# Patient Record
Sex: Female | Born: 1945 | Race: White | Hispanic: No | Marital: Married | Smoking: Former smoker
Health system: Southern US, Community
[De-identification: ages and names within clinical notes are randomized; demographics above are authoritative.]

## PROBLEM LIST (undated history)

## (undated) DIAGNOSIS — I10 Essential (primary) hypertension: Secondary | ICD-10-CM

## (undated) DIAGNOSIS — G43909 Migraine, unspecified, not intractable, without status migrainosus: Secondary | ICD-10-CM

## (undated) DIAGNOSIS — I679 Cerebrovascular disease, unspecified: Secondary | ICD-10-CM

## (undated) DIAGNOSIS — M1990 Unspecified osteoarthritis, unspecified site: Secondary | ICD-10-CM

## (undated) DIAGNOSIS — E785 Hyperlipidemia, unspecified: Secondary | ICD-10-CM

## (undated) DIAGNOSIS — I6529 Occlusion and stenosis of unspecified carotid artery: Secondary | ICD-10-CM

## (undated) HISTORY — DX: Occlusion and stenosis of unspecified carotid artery: I65.29

## (undated) HISTORY — DX: Cerebrovascular disease, unspecified: I67.9

## (undated) HISTORY — DX: Unspecified osteoarthritis, unspecified site: M19.90

## (undated) HISTORY — DX: Migraine, unspecified, not intractable, without status migrainosus: G43.909

## (undated) HISTORY — DX: Essential (primary) hypertension: I10

## (undated) HISTORY — DX: Hyperlipidemia, unspecified: E78.5

## (undated) HISTORY — PX: OTHER SURGICAL HISTORY: SHX169

---

## 1985-09-10 HISTORY — PX: BREAST BIOPSY: SHX20

## 1998-03-03 ENCOUNTER — Other Ambulatory Visit: Admission: RE | Admit: 1998-03-03 | Discharge: 1998-03-03 | Payer: Self-pay | Admitting: Obstetrics & Gynecology

## 1999-03-06 ENCOUNTER — Other Ambulatory Visit: Admission: RE | Admit: 1999-03-06 | Discharge: 1999-03-06 | Payer: Self-pay | Admitting: Obstetrics & Gynecology

## 2000-03-26 ENCOUNTER — Other Ambulatory Visit: Admission: RE | Admit: 2000-03-26 | Discharge: 2000-03-26 | Payer: Self-pay | Admitting: Obstetrics & Gynecology

## 2000-12-24 ENCOUNTER — Encounter (INDEPENDENT_AMBULATORY_CARE_PROVIDER_SITE_OTHER): Payer: Self-pay | Admitting: Specialist

## 2000-12-24 ENCOUNTER — Other Ambulatory Visit: Admission: RE | Admit: 2000-12-24 | Discharge: 2000-12-24 | Payer: Self-pay | Admitting: Obstetrics & Gynecology

## 2001-03-27 ENCOUNTER — Other Ambulatory Visit: Admission: RE | Admit: 2001-03-27 | Discharge: 2001-03-27 | Payer: Self-pay | Admitting: Obstetrics & Gynecology

## 2002-03-30 ENCOUNTER — Other Ambulatory Visit: Admission: RE | Admit: 2002-03-30 | Discharge: 2002-03-30 | Payer: Self-pay | Admitting: Obstetrics & Gynecology

## 2003-04-01 ENCOUNTER — Other Ambulatory Visit: Admission: RE | Admit: 2003-04-01 | Discharge: 2003-04-01 | Payer: Self-pay | Admitting: Obstetrics & Gynecology

## 2004-04-04 ENCOUNTER — Other Ambulatory Visit: Admission: RE | Admit: 2004-04-04 | Discharge: 2004-04-04 | Payer: Self-pay | Admitting: Obstetrics & Gynecology

## 2005-04-06 ENCOUNTER — Other Ambulatory Visit: Admission: RE | Admit: 2005-04-06 | Discharge: 2005-04-06 | Payer: Self-pay | Admitting: Obstetrics & Gynecology

## 2006-12-25 ENCOUNTER — Ambulatory Visit: Payer: Self-pay | Admitting: Vascular Surgery

## 2007-10-20 ENCOUNTER — Ambulatory Visit: Payer: Self-pay | Admitting: Cardiology

## 2007-12-01 ENCOUNTER — Ambulatory Visit: Payer: Self-pay | Admitting: Cardiology

## 2007-12-01 LAB — CONVERTED CEMR LAB
AST: 24 units/L (ref 0–37)
Albumin: 4.1 g/dL (ref 3.5–5.2)
Bilirubin, Direct: 0.1 mg/dL (ref 0.0–0.3)
Cholesterol: 186 mg/dL (ref 0–200)
Direct LDL: 109.2 mg/dL
Glucose, Bld: 101 mg/dL — ABNORMAL HIGH (ref 70–99)
HDL: 38.2 mg/dL — ABNORMAL LOW (ref >39.0)
Total Bilirubin: 0.6 mg/dL (ref 0.3–1.2)
Triglycerides: 212 mg/dL (ref 0–149)
VLDL: 42 mg/dL — ABNORMAL HIGH (ref 0–40)

## 2007-12-24 ENCOUNTER — Ambulatory Visit: Payer: Self-pay | Admitting: Cardiology

## 2008-01-06 ENCOUNTER — Ambulatory Visit: Payer: Self-pay | Admitting: Vascular Surgery

## 2008-01-13 ENCOUNTER — Ambulatory Visit: Payer: Self-pay | Admitting: Cardiology

## 2008-01-13 LAB — CONVERTED CEMR LAB
ALT: 20 units/L (ref 0–35)
AST: 24 units/L (ref 0–37)
Alkaline Phosphatase: 75 units/L (ref 39–117)
HDL: 32.8 mg/dL — ABNORMAL LOW (ref >39.0)
Total Bilirubin: 0.8 mg/dL (ref 0.3–1.2)
Total CHOL/HDL Ratio: 5.3
Triglycerides: 197 mg/dL — ABNORMAL HIGH (ref 0–149)

## 2008-01-29 ENCOUNTER — Ambulatory Visit: Payer: Self-pay | Admitting: Internal Medicine

## 2008-04-15 ENCOUNTER — Ambulatory Visit: Payer: Self-pay | Admitting: Cardiology

## 2008-04-15 LAB — CONVERTED CEMR LAB
AST: 21 units/L (ref 0–37)
Albumin: 4.1 g/dL (ref 3.5–5.2)
Alkaline Phosphatase: 79 units/L (ref 39–117)
Bilirubin, Direct: 0.1 mg/dL (ref 0.0–0.3)
Cholesterol: 251 mg/dL (ref 0–200)
Total CHOL/HDL Ratio: 6.2
VLDL: 44 mg/dL — ABNORMAL HIGH (ref 0–40)

## 2008-04-22 ENCOUNTER — Ambulatory Visit: Payer: Self-pay | Admitting: Cardiology

## 2008-05-27 ENCOUNTER — Ambulatory Visit: Payer: Self-pay | Admitting: Cardiology

## 2008-05-27 LAB — CONVERTED CEMR LAB
ALT: 21 U/L (ref 0–35)
AST: 25 U/L (ref 0–37)
Albumin: 4.1 g/dL (ref 3.5–5.2)
Alkaline Phosphatase: 87 U/L (ref 39–117)
Bilirubin, Direct: 0.1 mg/dL (ref 0.0–0.3)
Cholesterol: 251 mg/dL (ref 0–200)
Direct LDL: 171.6 mg/dL
HDL: 37.3 mg/dL — ABNORMAL LOW (ref >39.0)
Total Bilirubin: 0.7 mg/dL (ref 0.3–1.2)
Total CHOL/HDL Ratio: 6.7
Total Protein: 6.7 g/dL (ref 6.0–8.3)
Triglycerides: 198 mg/dL — ABNORMAL HIGH (ref 0–149)
VLDL: 40 mg/dL (ref 0–40)

## 2008-06-03 ENCOUNTER — Ambulatory Visit: Payer: Self-pay | Admitting: Cardiology

## 2008-08-11 ENCOUNTER — Ambulatory Visit: Payer: Self-pay | Admitting: Cardiology

## 2008-08-11 LAB — CONVERTED CEMR LAB
Albumin: 3.9 g/dL (ref 3.5–5.2)
Alkaline Phosphatase: 70 units/L (ref 39–117)
Cholesterol: 245 mg/dL (ref 0–200)
Direct LDL: 160.1 mg/dL
HDL: 38.7 mg/dL — ABNORMAL LOW (ref >39.0)
Total CHOL/HDL Ratio: 6.3
Total Protein: 6.6 g/dL (ref 6.0–8.3)
Triglycerides: 246 mg/dL (ref 0–149)
VLDL: 49 mg/dL — ABNORMAL HIGH (ref 0–40)

## 2008-08-12 ENCOUNTER — Ambulatory Visit: Payer: Self-pay | Admitting: Cardiology

## 2008-09-10 HISTORY — PX: ABDOMINAL HYSTERECTOMY: SHX81

## 2008-09-24 ENCOUNTER — Ambulatory Visit: Payer: Self-pay | Admitting: Cardiology

## 2008-11-01 ENCOUNTER — Ambulatory Visit: Payer: Self-pay

## 2008-11-01 LAB — CONVERTED CEMR LAB
ALT: 17 units/L (ref 0–35)
AST: 19 units/L (ref 0–37)
Direct LDL: 158.8 mg/dL
HDL: 34.8 mg/dL — ABNORMAL LOW (ref >39.0)
Total Bilirubin: 0.9 mg/dL (ref 0.3–1.2)
Total Protein: 6.5 g/dL (ref 6.0–8.3)
Triglycerides: 359 mg/dL (ref 0–149)

## 2008-11-04 ENCOUNTER — Ambulatory Visit: Payer: Self-pay | Admitting: Cardiology

## 2008-11-04 ENCOUNTER — Ambulatory Visit: Payer: Self-pay | Admitting: Internal Medicine

## 2008-11-04 LAB — CONVERTED CEMR LAB
CO2: 31 meq/L (ref 19–32)
Calcium: 9.6 mg/dL (ref 8.4–10.5)
Creatinine, Ser: 0.9 mg/dL (ref 0.4–1.2)
GFR calc Af Amer: 82 mL/min
Glucose, Bld: 91 mg/dL (ref 70–99)

## 2008-11-12 ENCOUNTER — Ambulatory Visit: Payer: Self-pay | Admitting: Cardiology

## 2008-11-12 LAB — CONVERTED CEMR LAB
BUN: 16 mg/dL (ref 6–23)
Calcium: 9.9 mg/dL (ref 8.4–10.5)
GFR calc Af Amer: 82 mL/min
GFR calc non Af Amer: 68 mL/min
Glucose, Bld: 99 mg/dL (ref 70–99)
Sodium: 140 meq/L (ref 135–145)

## 2008-11-26 ENCOUNTER — Ambulatory Visit: Payer: Self-pay | Admitting: Cardiology

## 2008-11-26 LAB — CONVERTED CEMR LAB
BUN: 19 mg/dL (ref 6–23)
Creatinine, Ser: 0.9 mg/dL (ref 0.4–1.2)
GFR calc non Af Amer: 67.45 mL/min (ref >60)

## 2008-12-24 ENCOUNTER — Ambulatory Visit: Payer: Self-pay | Admitting: Cardiology

## 2008-12-24 LAB — CONVERTED CEMR LAB
ALT: 19 units/L (ref 0–35)
AST: 20 units/L (ref 0–37)
Albumin: 4 g/dL (ref 3.5–5.2)
Alkaline Phosphatase: 83 units/L (ref 39–117)
Cholesterol: 214 mg/dL — ABNORMAL HIGH (ref 0–200)
Total Protein: 6.5 g/dL (ref 6.0–8.3)

## 2008-12-28 ENCOUNTER — Ambulatory Visit: Payer: Self-pay | Admitting: Vascular Surgery

## 2008-12-30 ENCOUNTER — Encounter: Payer: Self-pay | Admitting: Cardiology

## 2008-12-30 ENCOUNTER — Ambulatory Visit: Payer: Self-pay | Admitting: Internal Medicine

## 2008-12-30 DIAGNOSIS — E785 Hyperlipidemia, unspecified: Secondary | ICD-10-CM | POA: Insufficient documentation

## 2009-01-18 ENCOUNTER — Inpatient Hospital Stay (HOSPITAL_COMMUNITY): Admission: RE | Admit: 2009-01-18 | Discharge: 2009-01-19 | Payer: Self-pay | Admitting: Obstetrics and Gynecology

## 2009-01-18 ENCOUNTER — Encounter (INDEPENDENT_AMBULATORY_CARE_PROVIDER_SITE_OTHER): Payer: Self-pay | Admitting: Obstetrics and Gynecology

## 2009-03-28 ENCOUNTER — Ambulatory Visit: Payer: Self-pay | Admitting: Cardiovascular Disease

## 2009-03-28 LAB — CONVERTED CEMR LAB
AST: 24 units/L (ref 0–37)
Albumin: 4 g/dL (ref 3.5–5.2)
Cholesterol: 195 mg/dL (ref 0–200)
Direct LDL: 116.2 mg/dL
HDL: 35.6 mg/dL — ABNORMAL LOW (ref >39.00)
Total CHOL/HDL Ratio: 5
Triglycerides: 297 mg/dL — ABNORMAL HIGH (ref 0.0–149.0)

## 2009-04-04 ENCOUNTER — Ambulatory Visit: Payer: Self-pay | Admitting: Cardiology

## 2009-04-26 ENCOUNTER — Encounter (INDEPENDENT_AMBULATORY_CARE_PROVIDER_SITE_OTHER): Payer: Self-pay | Admitting: *Deleted

## 2009-07-14 ENCOUNTER — Encounter: Payer: Self-pay | Admitting: Cardiology

## 2009-07-18 ENCOUNTER — Telehealth: Payer: Self-pay | Admitting: Cardiology

## 2009-08-02 ENCOUNTER — Ambulatory Visit: Payer: Self-pay | Admitting: Cardiology

## 2009-08-02 LAB — CONVERTED CEMR LAB
ALT: 23 units/L (ref 0–35)
Alkaline Phosphatase: 90 units/L (ref 39–117)
Bilirubin, Direct: 0.1 mg/dL (ref 0.0–0.3)
Cholesterol: 201 mg/dL — ABNORMAL HIGH (ref 0–200)
HDL: 39.5 mg/dL (ref >39.00)
Total Protein: 6.9 g/dL (ref 6.0–8.3)

## 2009-08-11 ENCOUNTER — Ambulatory Visit: Payer: Self-pay | Admitting: Cardiovascular Disease

## 2009-11-03 DIAGNOSIS — I10 Essential (primary) hypertension: Secondary | ICD-10-CM | POA: Insufficient documentation

## 2009-11-03 DIAGNOSIS — I679 Cerebrovascular disease, unspecified: Secondary | ICD-10-CM

## 2009-11-03 DIAGNOSIS — I779 Disorder of arteries and arterioles, unspecified: Secondary | ICD-10-CM | POA: Insufficient documentation

## 2009-11-03 DIAGNOSIS — E876 Hypokalemia: Secondary | ICD-10-CM | POA: Insufficient documentation

## 2009-11-08 ENCOUNTER — Ambulatory Visit: Payer: Self-pay | Admitting: Cardiology

## 2009-11-17 ENCOUNTER — Ambulatory Visit: Payer: Self-pay | Admitting: Cardiology

## 2009-11-18 ENCOUNTER — Telehealth: Payer: Self-pay | Admitting: Cardiology

## 2009-11-18 LAB — CONVERTED CEMR LAB
Calcium: 9.7 mg/dL (ref 8.4–10.5)
Creatinine, Ser: 0.8 mg/dL (ref 0.4–1.2)

## 2009-11-30 ENCOUNTER — Telehealth: Payer: Self-pay | Admitting: Cardiology

## 2009-12-07 ENCOUNTER — Encounter: Payer: Self-pay | Admitting: Cardiology

## 2009-12-26 ENCOUNTER — Telehealth (INDEPENDENT_AMBULATORY_CARE_PROVIDER_SITE_OTHER): Payer: Self-pay | Admitting: *Deleted

## 2009-12-26 ENCOUNTER — Encounter: Payer: Self-pay | Admitting: Cardiology

## 2010-01-31 ENCOUNTER — Encounter: Payer: Self-pay | Admitting: Cardiology

## 2010-01-31 ENCOUNTER — Ambulatory Visit: Payer: Self-pay | Admitting: Vascular Surgery

## 2010-02-20 ENCOUNTER — Ambulatory Visit: Payer: Self-pay | Admitting: Cardiology

## 2010-02-20 LAB — CONVERTED CEMR LAB
ALT: 17 units/L (ref 0–35)
AST: 22 units/L (ref 0–37)
Albumin: 4.3 g/dL (ref 3.5–5.2)
Cholesterol: 252 mg/dL — ABNORMAL HIGH (ref 0–200)
Direct LDL: 177.8 mg/dL
Total Protein: 6.5 g/dL (ref 6.0–8.3)
Triglycerides: 223 mg/dL — ABNORMAL HIGH (ref 0.0–149.0)

## 2010-02-23 ENCOUNTER — Ambulatory Visit: Payer: Self-pay | Admitting: Cardiology

## 2010-04-21 ENCOUNTER — Telehealth: Payer: Self-pay | Admitting: Cardiology

## 2010-04-24 ENCOUNTER — Ambulatory Visit: Payer: Self-pay | Admitting: Cardiology

## 2010-04-25 LAB — CONVERTED CEMR LAB
ALT: 19 units/L (ref 0–35)
Alkaline Phosphatase: 76 units/L (ref 39–117)
BUN: 27 mg/dL — ABNORMAL HIGH (ref 6–23)
Bilirubin, Direct: 0.2 mg/dL (ref 0.0–0.3)
Chloride: 100 meq/L (ref 96–112)
Creatinine, Ser: 1.4 mg/dL — ABNORMAL HIGH (ref 0.4–1.2)
Direct LDL: 130.8 mg/dL
Total Protein: 6.5 g/dL (ref 6.0–8.3)

## 2010-04-27 ENCOUNTER — Ambulatory Visit: Payer: Self-pay | Admitting: Internal Medicine

## 2010-10-08 LAB — CONVERTED CEMR LAB
BUN: 17 mg/dL (ref 6–23)
Calcium: 9.7 mg/dL (ref 8.4–10.5)
GFR calc non Af Amer: 77.03 mL/min (ref >60)
Glucose, Bld: 87 mg/dL (ref 70–99)
Sodium: 140 meq/L (ref 135–145)

## 2010-10-12 NOTE — Letter (Signed)
Summary: Physician RX Concern  Physician RX Concern   Imported By: Roderic Ovens 01/10/2010 11:57:58  _____________________________________________________________________  External Attachment:    Type:   Image     Comment:   External Document

## 2010-10-12 NOTE — Progress Notes (Signed)
Summary: QUESTION ABOUT METOPROLOL  Phone Note Call from Patient Call back at Home Phone [phone]   Caller: Patient Summary of Call: PT HAVE QUESTION ABOUT HER MEDICATION ( METOPROLOL) Initial call taken by: Judie Grieve,  November 18, 2009 4:29 PM  Follow-up for Phone Call        spoke with pt, at the last office visit she had mentioned to dr Jens Som some problems she has had with the toprol. she was going to wait to change but has now decided she would like to change from toprol to something else. will foward to dr Jens Som for review Deliah Goody, RN  November 18, 2009 4:38 PM   Additional Follow-up for Phone Call Additional follow up Details #1::        change toprol to 25 q day for 2 days and then dc. Add norvasc 5 mg by mouth daily. Ferman Hamming, MD, [facility]  November 20, 2009 3:43 PM  spoke with pt, she has taken amlodipine and had to stop due to swelling in feet and legs. will let dr Jens Som know Deliah Goody, RN  November 21, 2009 5:05 PM      Additional Follow-up for Phone Call Additional follow up Details #2::    dc toprol as described previously and then begin cardizem CD 120 mg by mouth daily. Ferman Hamming, MD, [facility]  November 22, 2009 4:13 PM  pt aware of instructions with toprol and new med Deliah Goody, RN  November 23, 2009 10:41 AM   New/Updated Medications: CARDIZEM CD 120 MG XR24H-CAP (DILTIAZEM HCL COATED BEADS) one tablet by mouth once daily Prescriptions: CARDIZEM CD 120 MG XR24H-CAP (DILTIAZEM HCL COATED BEADS) one tablet by mouth once daily  #30 x 12   Entered by:   Deliah Goody, RN   Authorized by:   Ferman Hamming, MD, [facility]   Signed by:   Deliah Goody, RN on 11/23/2009   Method used:   Electronically to        [facility]  [phone]* (retail)       [address] [place], [place]  [address]       Ph: [phone] or [phone]       Fax: [phone]   RxID:   [id number]

## 2010-10-12 NOTE — Progress Notes (Signed)
Summary: Calling baut having labs drawn  Phone Note Call from Patient Call back at Work Phone [phone]   Caller: Patient Summary of Call: Pt calling regarding having spme labs drawn Initial call taken by: Judie Grieve,  April 21, 2010 10:14 AM  Follow-up for Phone Call        spoke with pt, she needs blood work done for her work. will add a bmp to labs already ordered Deliah Goody, RN  April 21, 2010 12:19 PM

## 2010-10-12 NOTE — Progress Notes (Signed)
Summary: med question  Phone Note Call from Patient Call back at Home Phone [phone]   Caller: Patient Reason for Call: Talk to Nurse Summary of Call: dilitiazem is making her jumpy, is this normal? request call Initial call taken by: Migdalia Dk,  November 30, 2009 4:30 PM  Follow-up for Phone Call        spoke with pt, she has noticed that she is "wired" all the time since starting the diltiazem. she feels jumpy inside and is unable to sit still. she has had three doses of the diltiazem. denies other problems. will let dr Jens Som know Deliah Goody, RN  November 30, 2009 5:21 PM   Additional Follow-up for Phone Call Additional follow up Details #1::        not typical side effect of diltiazem Ferman Hamming, MD, [facility]  December 01, 2009 6:07 PM  spoke with pt, she had faxed a letter to [place] regarding her symptoms. she will stop the  cardizem and restart the toprol at the previous dosage. she will report any problems Deliah Goody, RN  December 07, 2009 3:19 PM     New/Updated Medications: METOPROLOL SUCCINATE 50 MG XR24H-TAB (METOPROLOL SUCCINATE) Take one tablet by mouth daily

## 2010-10-12 NOTE — Progress Notes (Signed)
  Phone Note Outgoing Call   Call placed by: Deliah Goody, RN,  December 26, 2009 3:45 PM Summary of Call: we had received a fax from pt requesting to decrease her metoprolol to 25mg  to help with side effects. dr Jens Som reviewed and okay given for pt to take toprol 25mg  once daily. Initial call taken by: Deliah Goody, RN,  December 26, 2009 3:46 PM    New/Updated Medications: METOPROLOL SUCCINATE 25 MG XR24H-TAB (METOPROLOL SUCCINATE) Take one tablet by mouth daily

## 2010-10-12 NOTE — Assessment & Plan Note (Signed)
Summary: yearly sl   CC:  yearly  visit.  History of Present Illness: Christine Daniels returns for followup today.  She is a very pleasant  female with hypertension and  hyperlipidemia, as well as cerebrovascular disease.  I last saw her in February of 2010. Since then the patient denies any dyspnea on exertion, orthopnea, PND, pedal edema, palpitations, syncope or chest pain.   Hypertension History:      Positive major cardiovascular risk factors include female age 46 years old or older, hyperlipidemia, and hypertension.  Negative major cardiovascular risk factors include no history of diabetes.        Further assessment for target organ damage reveals no history of ASHD or peripheral vascular disease.    Current Medications (verified): 1)  Toprol Xl 50 Mg Xr24h-Tab (Metoprolol Succinate) .Marland Kitchen.. 1 Tablet By Mouth Once A Day 2)  Aspirin Adult Low Strength 81 Mg Tbec (Aspirin) .... Take 1 Tablet By Mouth Daily 3)  Multivitamins  Tabs (Multiple Vitamin) .... Take 1 Tablet Daily 4)  Co Q-10 50 Mg Caps (Coenzyme Q10) .... Take 2 Capsults Twice Daily 5)  Vitamin C 500 Mg  Tabs (Ascorbic Acid) .... 3 Tabs By Mouth Once Daily 6)  Folic Acid 1 Mg Tabs (Folic Acid) .... Take 1 Tablet Daily 7)  Triamterene-Hctz 75-50 Mg Tabs (Triamterene-Hctz) .... Take 1 Tablet Daily 8)  Lipitor 10 Mg Tabs (Atorvastatin Calcium) .Marland Kitchen.. 1 Tab Every Other Day 9)  Vitamin D 1000 Unit Tabs (Cholecalciferol) .Marland Kitchen.. 1 Tab By Mouth Once Daily 10)  Multivitamins   Tabs (Multiple Vitamin) .Marland Kitchen.. 1 Tab By Mouth Once Daily 11)  Zonegran 100 Mg Caps (Zonisamide) .Marland Kitchen.. 1 Tab By Mouth Once Daily 12)  Claritin 10 Mg Tabs (Loratadine) .... As Needed 13)  Zomig 2.5 Mg Tabs (Zolmitriptan) .... As Needed 14)  Skelaxin 800 Mg Tabs (Metaxalone) .... As Needed  Allergies: No Known Drug Allergies  Past History:  Past Medical History: HYPERLIPIDEMIA (ICD-272.4) CEREBROVASCULAR DISEASE (ICD-437.9) followed by Dr. Edilia Bo HYPERTENSION  (ICD-401.9)  Incomplete uterovaginal prolapse  Arthritis in her back.  history of migraines  Past Surgical History: Reviewed history from 11/03/2009 and no changes required.  Status post bilateral tubal ligation.    Laparoscopically-assisted vaginal hysterectomy with bilateral salpingo-oophorectomy, McCall culdoplasty, anterior and posterior colporrhaphy.      Social History: Reviewed history from 11/03/2009 and no changes required.  She has remote history of minimal tobacco use, but has   not smoked in years.  She rarely consumes alcohol.      Review of Systems       Complains of some rash under her chin, ringing in her ears, back pain but no fevers or chills, productive cough, hemoptysis, dysphasia, odynophagia, melena, hematochezia, dysuria, hematuria, rash, seizure activity, orthopnea, PND, pedal edema, claudication. Remaining systems are negative.   Vital Signs:  Patient profile:   65 year old female Height:      70 inches Weight:      169 pounds BMI:     24.34 Pulse rate:   74 / minute Resp:     12 per minute BP sitting:   130 / 72  (left arm)  Vitals Entered By: Kem Parkinson (November 08, 2009 8:32 AM)  Physical Exam  General:  Well-developed well-nourished in no acute distress.  Skin is warm and dry.  HEENT is normal.  Neck is supple. No thyromegaly.  Chest is clear to auscultation with normal expansion.  Cardiovascular exam is regular rate and rhythm.  Abdominal exam nontender or distended. No masses palpated. Extremities show no edema. neuro grossly intact    EKG  Procedure date:  11/08/2009  Findings:      normal sinus rhythm at a rate of 74. Axis normal. No ST changes.  Impression & Recommendations:  Problem # 1:  HYPERLIPIDEMIA (ICD-272.4) Continue present dose of statin. Higher doses caused significant back pain and myalgias. Followup in lipid clinic. Her updated medication list for this problem includes:    Lipitor 10 Mg Tabs (Atorvastatin  calcium) .Marland Kitchen... 1 tab every other day  Problem # 2:  CEREBROVASCULAR DISEASE (ICD-437.9) This is being managed by Dr. Edilia Bo. Continue aspirin and statin.  Problem # 3:  HYPERTENSION (ICD-401.9) Blood pressure controlled on present medications. Will continue. Check bmet. Her updated medication list for this problem includes:    Toprol Xl 50 Mg Xr24h-tab (Metoprolol succinate) .Marland Kitchen... 1 tablet by mouth once a day    Aspirin Adult Low Strength 81 Mg Tbec (Aspirin) .Marland Kitchen... Take 1 tablet by mouth daily    Triamterene-hctz 75-50 Mg Tabs (Triamterene-hctz) .Marland Kitchen... Take 1 tablet daily  Orders: TLB-BMP (Basic Metabolic Panel-BMET) (80048-METABOL) EKG w/ Interpretation (93000)  Hypertension Assessment/Plan:      The patient's hypertensive risk group is category B: At least one risk factor (excluding diabetes) with no target organ damage.  Today's blood pressure is 130/72.     Patient Instructions: 1)  Your physician recommends that you schedule a follow-up appointment in: ONE YEAR

## 2010-10-12 NOTE — Assessment & Plan Note (Signed)
Summary: rov/cb   CC:  dyslipidemia follow-up.  History of Present Illness:  Lipid Clinic Visit      The patient comes in today for dyslipidemia follow-up.  The patient presents with medication problems and back pain which has resolved since decreasing Crestor to 5mg  QOD.   She has no complaints of chest pain, palpitations, shortness of breath, muscle aches, and muscle cramps.  Dietary compliance review reveals an overall grade of not eating 5 or more fruits and vegetables and not counting carbohydrates.  Review of exercise habits reveals that the patient is walking and daily.  Adjunctive measures being instituted include omega-3 supplements.  Compliance with medication is good.    Christine Daniels is seen in lipid clinic for evaluation and management of hyperlipidemia.  She has been compliant with her therapy.  She has been exercising and consuming low-fat, low-cholesterol diet.    All satatins have caused pain.  Crestor 5mg  every other day tolerable  Lipid Management Provider  Charolotte Eke, PharmD  Preventive Screening-Counseling & Management  Alcohol-Tobacco     Alcohol drinks/day: <1     Smoking Status: quit  Current Medications (verified): 1)  Metoprolol Succinate 25 Mg Xr24h-Tab (Metoprolol Succinate) .... Take One Tablet By Mouth Daily 2)  Aspirin Adult Low Strength 81 Mg Tbec (Aspirin) .... Take 1 Tablet By Mouth Daily 3)  Multivitamins  Tabs (Multiple Vitamin) .... Take 1 Tablet Daily 4)  Co Q-10 50 Mg Caps (Coenzyme Q10) .... Take 2 Capsults Twice Daily 5)  Vitamin C 500 Mg  Tabs (Ascorbic Acid) .... 3 Tabs By Mouth Once Daily 6)  Folic Acid 1 Mg Tabs (Folic Acid) .... Take 1 Tablet Daily 7)  Triamterene-Hctz 75-50 Mg Tabs (Triamterene-Hctz) .... Take 1 Tablet Daily 8)  Vitamin D 1000 Unit Tabs (Cholecalciferol) .Marland Kitchen.. 1 Tab By Mouth Once Daily 9)  Multivitamins   Tabs (Multiple Vitamin) .Marland Kitchen.. 1 Tab By Mouth Once Daily 10)  Zonegran 100 Mg Caps (Zonisamide) .Marland Kitchen.. 1 Tab By Mouth Once  Daily 11)  Claritin 10 Mg Tabs (Loratadine) .... As Needed 12)  Zomig 2.5 Mg Tabs (Zolmitriptan) .... As Needed 13)  Skelaxin 800 Mg Tabs (Metaxalone) .... As Needed 14)  Klor-Con M20 20 Meq Cr-Tabs (Potassium Chloride Crys Cr) .... One Tablet By Mouth Once Daily 15)  Crestor 10 Mg Tabs (Rosuvastatin Calcium) .... Take 1 Tablet By Mouth Three Times Weekly 16)  Fish Oil 1000 Mg Caps (Omega-3 Fatty Acids) .... 3 Capsules By Mouth Daily  Allergies (verified): No Known Drug Allergies  Social History: Alcohol drinks/day:  <1 Smoking Status:  quit   Vital Signs:  Patient profile:   65 year old female Height:      70 inches Weight:      166 pounds Pulse rate:   80 / minute Pulse rhythm:   regular BP sitting:   140 / 98  (right arm) Cuff size:   regular  Impression & Recommendations:  Problem # 1:  HYPERLIPIDEMIA (ICD-272.4)  Her updated medication list for this problem includes:    Crestor 10 Mg Tabs (Rosuvastatin calcium) .Marland Kitchen... Take 1 tablet by mouth three times weekly   Christine Daniels complained of back pain after increased Crestor 10mg  every other day so went back to Crestor 5mg  every other day and back pains are resolved.  She  walks around neighborhood several times a day with new puppies and  golfs approx 1  x week.  She also hasd an active lifestyle.  She eats a failrly  healthy diet, cereal for  breakfast, snack bars and rice cakes with peanut butter thruought day and ahealthy dinner.  We discussed ways to increase fiber in diet and increase fruits and veges.   Her lipid panel is not at goal but improved.   TC 201 at goal < 200  TG 185 > goal < 150  LDL 130 > goal < 100 but much improved  HDL 43 at goal > 40. BP elevated today in office but bp usually < 130/90 at home  Plan - to keep crestor 5mg  every other day increase exercise walk at least 1mi at a time 1x day  increase fiber and fruit and vege in diet check 54mo

## 2010-10-12 NOTE — Assessment & Plan Note (Signed)
Summary: rov-tp   Visit Type:  Follow-up   History of Present Illness: Christine Daniels is a 65 yo female presenting for FU to Lipid Clinic.  Patient has significant back pain, which is being treated with injections (likely steroids, but unsure).  She is also experiencing increased stress at work.  She has been off her cholesterol meds for 1 week.  Statins (Zocor, Crestor, and most others) worsen her back pain, but she tolerates Lipitor 10 mg every other day because she knows how beneficial they are.  She also takes CoQ10 150 mg two times a day and Fish Oil 2 gm daily.  She is compliant.  Patient states she has lost about 5 lbs in the last few months, however she admits liking junk food.  Weaknesses include french fries, pizza, and chips.  She does not eat sweets.  Her breakfast meal is fairly healthy - cereal w/ 1/2% milk or muffins w/ coffee.  For lunch, she usually eats a light PB or other sandwich on wheat or rye.  For dinner, she eats a lot of take-out.  She eats fried or baked meats (50/50) with vegetables.  She does not eat large portions.  Patient admits she does not watch her diet like she should.  Patient's exercise is limited by back pain, however she does golf approx. 1x weekly, does yard work, and walks 2-3 nights per week.  She has been more active since last seen.    Christine Daniels is seen in lipid clinic for evaluation and management of hyperlipidemia.  She has been compliant with her therapy.  She has been exercising and consuming low-fat, low-cholesterol diet.    Lipid Management Provider  Charolotte Eke, PharmD  Preventive Screening-Counseling & Management  Alcohol-Tobacco     Alcohol drinks/day: 0     Smoking Status: never  Current Medications (verified): 1)  Metoprolol Succinate 25 Mg Xr24h-Tab (Metoprolol Succinate) .... Take One Tablet By Mouth Daily 2)  Aspirin Adult Low Strength 81 Mg Tbec (Aspirin) .... Take 1 Tablet By Mouth Daily 3)  Multivitamins  Tabs (Multiple Vitamin)  .... Take 1 Tablet Daily 4)  Co Q-10 50 Mg Caps (Coenzyme Q10) .... Take 2 Capsults Twice Daily 5)  Vitamin C 500 Mg  Tabs (Ascorbic Acid) .... 3 Tabs By Mouth Once Daily 6)  Folic Acid 1 Mg Tabs (Folic Acid) .... Take 1 Tablet Daily 7)  Triamterene-Hctz 75-50 Mg Tabs (Triamterene-Hctz) .... Take 1 Tablet Daily 8)  Vitamin D 1000 Unit Tabs (Cholecalciferol) .Marland Kitchen.. 1 Tab By Mouth Once Daily 9)  Multivitamins   Tabs (Multiple Vitamin) .Marland Kitchen.. 1 Tab By Mouth Once Daily 10)  Zonegran 100 Mg Caps (Zonisamide) .Marland Kitchen.. 1 Tab By Mouth Once Daily 11)  Claritin 10 Mg Tabs (Loratadine) .... As Needed 12)  Zomig 2.5 Mg Tabs (Zolmitriptan) .... As Needed 13)  Skelaxin 800 Mg Tabs (Metaxalone) .... As Needed 14)  Klor-Con M20 20 Meq Cr-Tabs (Potassium Chloride Crys Cr) .... One Tablet By Mouth Once Daily 15)  Crestor 10 Mg Tabs (Rosuvastatin Calcium) .... Take 1 Tablet By Mouth Three Times Weekly 16)  Fish Oil 1000 Mg Caps (Omega-3 Fatty Acids) .... 3 Capsules By Mouth Daily  Allergies (verified): No Known Drug Allergies  Social History: Alcohol drinks/day:  0 Smoking Status:  never   Impression & Recommendations:  Problem # 1:  HYPERLIPIDEMIA (ICD-272.4) Assessment Deteriorated NCEP/ATP III goals are TC<200, TG<150, LDL<100, HDL>40. Current lipid panel is TC 252, TG 223, LDL 177, HDL 46.7. LFTs WNL.  Dyslipidemia has worsened since last visit, except HDL is now above goal. Due to increased back pain, decided to DC Lipitor, hold statins x1 wk, and try Crestor 10 mg 3x weekly, which has not previously been tried. If this fails, other options include Zetia, Tricor, or Niaspan. Increase Fish Oil to 3 caps daily. Increase fruit/vegetable intake. Decrease fried foods to 1x wk. Exercise as tolerated. FU on 04/27/10 with Lipid Clinic. 8/15 for labs. The following medications were removed from the medication list:    Lipitor 10 Mg Tabs (Atorvastatin calcium) .Marland Kitchen... 1 tab every other day Her updated medication list for  this problem includes:    Crestor 10 Mg Tabs (Rosuvastatin calcium) .Marland Kitchen... Take 1 tablet by mouth three times weekly    Fish Oil 1000 Mg Caps .Marland Kitchen... 2 capsules by mouth daily    Coenzyme Q10 150 mg .Marland Kitchen... Takes one by mouth two times a day   Patient Instructions: 1)  1. Stop Lipitor. 2)  2. Do not take any Crestor until 6/23 or 6/24, then start Crestor 10 mg 3 times per week. May try twice weekly if muscle pain is worse, or may 1/2 the 10 mg tablet. 3)  3. Increase Fish Oil to 3 capsules daily. 4)  4. Continue Coenzyme Q10. 5)  5. Increase fruits and vegetables to at least 3 servings per day. Limit fried food to once weekly. 6)  6. Exercise as tolerated. 7)  7. Call Lipid Clinic if muscle pains are intolerable before next visit.  8)  8. Follow-up with Lipid Clinic in 8 weeks on 04/27/10 at 8:30. 9)  9. Fasting labs on 04/24/10 at [facility].

## 2010-10-12 NOTE — Letter (Signed)
Summary: Physician RX Concern  Physician RX Concern   Imported By: Roderic Ovens 01/09/2010 16:26:17  _____________________________________________________________________  External Attachment:    Type:   Image     Comment:   External Document

## 2010-10-23 ENCOUNTER — Other Ambulatory Visit: Payer: Self-pay

## 2010-10-23 ENCOUNTER — Encounter (INDEPENDENT_AMBULATORY_CARE_PROVIDER_SITE_OTHER): Payer: Self-pay | Admitting: *Deleted

## 2010-10-23 ENCOUNTER — Other Ambulatory Visit: Payer: Self-pay | Admitting: Cardiology

## 2010-10-23 DIAGNOSIS — E7800 Pure hypercholesterolemia, unspecified: Secondary | ICD-10-CM

## 2010-10-23 DIAGNOSIS — Z79899 Other long term (current) drug therapy: Secondary | ICD-10-CM

## 2010-10-23 DIAGNOSIS — E785 Hyperlipidemia, unspecified: Secondary | ICD-10-CM

## 2010-10-23 LAB — COMPREHENSIVE METABOLIC PANEL
Albumin: 4.2 g/dL (ref 3.5–5.2)
BUN: 22 mg/dL (ref 6–23)
CO2: 30 mEq/L (ref 19–32)
Calcium: 9.1 mg/dL (ref 8.4–10.5)
GFR: 81.48 mL/min (ref >60.00)
Glucose, Bld: 82 mg/dL (ref 70–99)
Potassium: 3.8 mEq/L (ref 3.5–5.1)
Sodium: 135 mEq/L (ref 135–145)
Total Protein: 6.4 g/dL (ref 6.0–8.3)

## 2010-10-23 LAB — LDL CHOLESTEROL, DIRECT: Direct LDL: 132.3 mg/dL

## 2010-10-23 LAB — LIPID PANEL: HDL: 59.7 mg/dL (ref >39.00)

## 2010-10-26 ENCOUNTER — Encounter: Payer: Self-pay | Admitting: Cardiology

## 2010-10-26 ENCOUNTER — Ambulatory Visit (INDEPENDENT_AMBULATORY_CARE_PROVIDER_SITE_OTHER): Payer: Commercial Managed Care - PPO

## 2010-10-26 DIAGNOSIS — E7800 Pure hypercholesterolemia, unspecified: Secondary | ICD-10-CM

## 2010-10-26 DIAGNOSIS — Z79899 Other long term (current) drug therapy: Secondary | ICD-10-CM

## 2010-11-01 NOTE — Assessment & Plan Note (Signed)
Summary: ROV LIPIDLMC/TT   History of Present Illness: The patient comes in today for dyslipidemia follow-up.  The patient has experienced muscle pains with statins but currently has no complaints of muscle aches or cramps while taking Crestor 5 mg every other day.   The patient is fairly compliant with her diet. For breakfast she eats cereal or a muffin with low-fat milk. For lunch she eats a sandwich/salad. For dinner she eats some form of meat with vegetables. She snacks on fruit or granola bars. She claims her weakness is "potato chips". She dose not typically eat desserts.   The patient has been walking 20-30 minutes 2-3 times/day since she has gotten a dog. She also golfs whenever the weather is nice.  The patient does not smoke but does drink alcohol on ocassion.    Current Medications (verified): 1)  Metoprolol Succinate 25 Mg Xr24h-Tab (Metoprolol Succinate) .... Take One Tablet By Mouth Daily 2)  Aspirin Adult Low Strength 81 Mg Tbec (Aspirin) .... Take 1 Tablet By Mouth Daily 3)  Multivitamins  Tabs (Multiple Vitamin) .... Take 1 Tablet Daily 4)  Co Q-10 50 Mg Caps (Coenzyme Q10) .... Take 2 Capsults Twice Daily 5)  Vitamin C 500 Mg  Tabs (Ascorbic Acid) .... 3 Tabs By Mouth Once Daily 6)  Folic Acid 1 Mg Tabs (Folic Acid) .... Take 1 Tablet Daily 7)  Triamterene-Hctz 75-50 Mg Tabs (Triamterene-Hctz) .... Take 1 Tablet Daily 8)  Vitamin D 1000 Unit Tabs (Cholecalciferol) .Marland Kitchen.. 1 Tab By Mouth Once Daily 9)  Multivitamins   Tabs (Multiple Vitamin) .Marland Kitchen.. 1 Tab By Mouth Once Daily 10)  Zonegran 100 Mg Caps (Zonisamide) .... 2 Tab By Mouth Once Daily 11)  Claritin 10 Mg Tabs (Loratadine) .... As Needed 12)  Zomig 2.5 Mg Tabs (Zolmitriptan) .... As Needed 13)  Skelaxin 800 Mg Tabs (Metaxalone) .... As Needed 14)  Klor-Con M20 20 Meq Cr-Tabs (Potassium Chloride Crys Cr) .... One Tablet By Mouth Once Daily 15)  Crestor 10 Mg Tabs (Rosuvastatin Calcium) .... Take 1 Tablet By Mouth Three  Times Weekly 16)  Fish Oil 1000 Mg Caps (Omega-3 Fatty Acids) .... 3 Capsules By Mouth Daily  Allergies (verified): No Known Drug Allergies  Vital Signs:  Patient profile:   65 year old female Height:      70 inches Weight:      162 pounds Pulse rate:   62 / minute BP sitting:   134 / 90  (right arm)   Impression & Recommendations:  Problem # 1:  HYPERLIPIDEMIA (ICD-272.4) Per ATEP III guidelines, Mrs. Dardis is currently only at goal for HDL = 59.7. She is NOT at goal for TC=209 (goal <200), TG=200 (goal <150), and LDL= 132.3 (goal <100). She has been compliant with taking Crestor 5 mg every other day but has not taken her fish oil as prescribed. She is doing well with her diet/exercise as shown by improvements in her HDL.  Mrs. Langsam has agreed to try alternating between Crestor 5 mg and Crestor 10 mg every other day. She will still only be taking the medicine 3-4x/week but this will serve to increase her dose slightly while still attempting to manager her muscle pain. If she cannot tolerate the occassional 10 mg dose--she can drop back down to 5 mg. She does complain of slight muscle pain the day after she takes her Crestor doses. She also has agreed to start taking her fish oil again. She wasn't taking them because the "tablets were too  big and she just didn't like taking them." After counseling on the importance of the medication she will try to resume her dose. She will take 3 grams/day.  In regards to diet and exercise, she will keep up her current habits and we will work on medication adjustments at this point. She was told to let us know if her new regimen is not tolerable.  Her updated medication list for this problem includes:    Crestor 5-10 Mg Tabs (Rosuvastatin calcium) .Marland Kitchen... Take 1 tablet by mouth 3-4 times weekly    Fish Oil  1 g Tabs (Omega-3 fatty acids)...........Marland KitchenTake 3 tablets by mouth daily.  Patient Instructions: 1)  Change Crestor to take 5 mg or 10 mg  (alternating) every other day 2)  Resume Fish oil 3 grams daily 3)  Continue eating low-cholesterol diet 4)  Continue walking 20-30 minutes 2-3x/day 5)  Will f/u with lipid panel and appt in 6 months

## 2010-11-09 ENCOUNTER — Ambulatory Visit (INDEPENDENT_AMBULATORY_CARE_PROVIDER_SITE_OTHER): Payer: Commercial Managed Care - PPO | Admitting: Cardiology

## 2010-11-09 ENCOUNTER — Encounter: Payer: Self-pay | Admitting: Cardiology

## 2010-11-09 DIAGNOSIS — I10 Essential (primary) hypertension: Secondary | ICD-10-CM

## 2010-11-09 DIAGNOSIS — E7800 Pure hypercholesterolemia, unspecified: Secondary | ICD-10-CM

## 2010-11-16 NOTE — Assessment & Plan Note (Signed)
Summary: [email or web address]   CC:  check up.  History of Present Illness: Christine Daniels returns for followup today.  She is a very pleasant  female with hypertension and  hyperlipidemia, as well as cerebrovascular disease. Carotid Dopplers in May of 2011 showed 40-59% bilateral stenosis. I last saw her in March of 2011. Since then the patient denies any dyspnea on exertion, orthopnea, PND, pedal edema, palpitations, syncope or chest pain.  Current Medications (verified): 1)  Metoprolol Succinate 25 Mg Xr24h-Tab (Metoprolol Succinate) .... Take One Tablet By Mouth Daily 2)  Aspirin Adult Low Strength 81 Mg Tbec (Aspirin) .... Take 1 Tablet By Mouth Daily 3)  Multivitamins  Tabs (Multiple Vitamin) .... Take 1 Tablet Daily 4)  Co Q-10 50 Mg Caps (Coenzyme Q10) .... Take 2 Capsults Twice Daily 5)  Triamterene-Hctz 75-50 Mg Tabs (Triamterene-Hctz) .... Take 1 Tablet Daily 6)  Vitamin D 1000 Unit Tabs (Cholecalciferol) .Marland Kitchen.. 1 Tab By Mouth Once Daily 7)  Multivitamins   Tabs (Multiple Vitamin) .Marland Kitchen.. 1 Tab By Mouth Once Daily 8)  Zonegran 100 Mg Caps (Zonisamide) .... 2 Tab By Mouth Once Daily 9)  Claritin 10 Mg Tabs (Loratadine) .... As Needed 10)  Zomig 2.5 Mg Tabs (Zolmitriptan) .... As Needed 11)  Klor-Con M20 20 Meq Cr-Tabs (Potassium Chloride Crys Cr) .... One Tablet By Mouth Once Daily 12)  Crestor 10 Mg Tabs (Rosuvastatin Calcium) .... Take 1 Tablet By Mouth Three Times Weekly 13)  Fish Oil 1000 Mg Caps (Omega-3 Fatty Acids) .... 3 Capsules By Mouth Daily  Allergies: No Known Drug Allergies  Past History:  Past Medical History: Reviewed history from 11/08/2009 and no changes required. HYPERLIPIDEMIA (ICD-272.4) CEREBROVASCULAR DISEASE (ICD-437.9) followed by Dr. Edilia Bo HYPERTENSION (ICD-401.9)  Incomplete uterovaginal prolapse  Arthritis in her back.  history of migraines  Past Surgical History: Status post bilateral tubal ligation.   Laparoscopically-assisted vaginal hysterectomy  with bilateral salpingo-oophorectomy, McCall culdoplasty, anterior and posterior colporrhaphy.      Social History: Reviewed history from 11/03/2009 and no changes required.  She has remote history of minimal tobacco use, but has   not smoked in years.  She rarely consumes alcohol.      Review of Systems       no fevers or chills, productive cough, hemoptysis, dysphasia, odynophagia, melena, hematochezia, dysuria, hematuria, rash, seizure activity, orthopnea, PND, pedal edema, claudication. Remaining systems are negative.   Vital Signs:  Patient profile:   65 year old female Height:      70 inches Weight:      163 pounds BMI:     23.47 Pulse rate:   71 / minute Resp:     14 per minute BP sitting:   134 / 78  (left arm)  Vitals Entered By: Kem Parkinson (November 09, 2010 10:38 AM)  Physical Exam  General:  Well-developed well-nourished in no acute distress.  Skin is warm and dry.  HEENT is normal.  Neck is supple. No thyromegaly.  Chest is clear to auscultation with normal expansion.  Cardiovascular exam is regular rate and rhythm.  Abdominal exam nontender or distended. No masses palpated. Extremities show no edema. neuro grossly intact    EKG  Procedure date:  11/09/2010  Findings:      Sinus rhythm with no ST changes.  Impression & Recommendations:  Problem # 1:  HYPERLIPIDEMIA (ICD-272.4) Continue present medications. Followup in the lipid clinic. Her updated medication list for this problem includes:    Crestor 10 Mg Tabs (Rosuvastatin calcium) .Marland KitchenMarland KitchenMarland KitchenMarland Kitchen  Take 1 tablet by mouth three times weekly  Problem # 2:  CEREBROVASCULAR DISEASE (ICD-437.9) Continue aspirin and statin. Followup carotid Dopplers per vascular surgery.  Problem # 3:  HYPERTENSION (ICD-401.9) Blood pressure controlled on present medications. Will continue. Her updated medication list for this problem includes:    Metoprolol Succinate 25 Mg Xr24h-tab (Metoprolol succinate) .Marland Kitchen... Take one  tablet by mouth daily    Aspirin Adult Low Strength 81 Mg Tbec (Aspirin) .Marland Kitchen... Take 1 tablet by mouth daily    Triamterene-hctz 75-50 Mg Tabs (Triamterene-hctz) .Marland Kitchen... Take 1 tablet daily

## 2010-12-19 LAB — URINALYSIS, ROUTINE W REFLEX MICROSCOPIC
Nitrite: NEGATIVE
Protein, ur: NEGATIVE mg/dL
Specific Gravity, Urine: 1.01 (ref 1.005–1.030)
Urobilinogen, UA: 0.2 mg/dL (ref 0.0–1.0)

## 2010-12-19 LAB — BASIC METABOLIC PANEL
BUN: 7 mg/dL (ref 6–23)
Chloride: 97 mEq/L (ref 96–112)
Creatinine, Ser: 0.57 mg/dL (ref 0.4–1.2)
GFR calc non Af Amer: >60 mL/min (ref >60)
Glucose, Bld: 125 mg/dL — ABNORMAL HIGH (ref 70–99)

## 2010-12-19 LAB — COMPREHENSIVE METABOLIC PANEL
ALT: 21 U/L (ref 0–35)
AST: 26 U/L (ref 0–37)
Albumin: 4.2 g/dL (ref 3.5–5.2)
Alkaline Phosphatase: 85 U/L (ref 39–117)
BUN: 15 mg/dL (ref 6–23)
Chloride: 97 mEq/L (ref 96–112)
GFR calc Af Amer: >60 mL/min (ref >60)
Potassium: 3 mEq/L — ABNORMAL LOW (ref 3.5–5.1)
Sodium: 134 mEq/L — ABNORMAL LOW (ref 135–145)
Total Protein: 6.5 g/dL (ref 6.0–8.3)

## 2010-12-19 LAB — CBC
HCT: 34.4 % — ABNORMAL LOW (ref 36.0–46.0)
MCV: 93.7 fL (ref 78.0–100.0)
Platelets: 163 10*3/uL (ref 150–400)
Platelets: 225 10*3/uL (ref 150–400)
RDW: 12.6 % (ref 11.5–15.5)
RDW: 12.8 % (ref 11.5–15.5)
WBC: 14.9 10*3/uL — ABNORMAL HIGH (ref 4.0–10.5)
WBC: 6 10*3/uL (ref 4.0–10.5)

## 2010-12-19 LAB — URINE MICROSCOPIC-ADD ON

## 2011-01-10 ENCOUNTER — Other Ambulatory Visit: Payer: Commercial Managed Care - PPO

## 2011-01-22 ENCOUNTER — Other Ambulatory Visit (INDEPENDENT_AMBULATORY_CARE_PROVIDER_SITE_OTHER): Payer: Commercial Managed Care - PPO

## 2011-01-22 DIAGNOSIS — I6529 Occlusion and stenosis of unspecified carotid artery: Secondary | ICD-10-CM

## 2011-01-23 NOTE — Procedures (Signed)
CAROTID DUPLEX EXAM   INDICATION:  Followup, carotid artery disease.   HISTORY:  Diabetes:  No.  Cardiac:  No.  Hypertension:  Yes.  Smoking:  No.  Previous Surgery:  No.  CV History:  No.  Amaurosis Fugax No, Paresthesias No, Hemiparesis No                                       RIGHT             LEFT  Brachial systolic pressure:         120               110  Brachial Doppler waveforms:         Triphasic         Triphasic  Vertebral direction of flow:        Antegrade         Antegrade  DUPLEX VELOCITIES (cm/sec)  CCA peak systolic                   65                75  ECA peak systolic                   55                61  ICA peak systolic                   148               127  ICA end diastolic                   56                39  PLAQUE MORPHOLOGY:                  Homogenous        Homogenous  PLAQUE AMOUNT:                      Moderate          Moderate  PLAQUE LOCATION:                    Mid-distal ICA/CCA                  Mid-distal ICA/CCA   IMPRESSION:  1. Bilateral mid-distal internal carotid artery stenosis of 40-59%.  2. No significant changes from previous study.   ___________________________________________  Di Kindle. Edilia Bo, M.D.   AS/MEDQ  D:  01/06/2008  T:  01/06/2008  Job:  [id number]

## 2011-01-23 NOTE — Assessment & Plan Note (Signed)
[facility]                               LIPID CLINIC NOTE   Christine, Daniels                      MRN:          [id number]  DATE:06/03/2008                            DOB:          07/30/1946    Christine Daniels comes in today for followup of her hyperlipidemia therapy  which includes fish oil 1200 mg daily which she has recently started.  She elected not to start Questran as discussed at her August visit due  to her prescription plan and pharmacy changing as well as issues of  migraine headaches she was having.   Other medications include aspirin 81 mg, multivitamin, vitamin C, folic  acid, vitamin E, Maxzide, vitamin D, and Norvasc.   PHYSICAL EXAMINATION:  Weight is 169 pounds, blood pressure is 135/90,  heart rate is 80.   LABORATORY DATA:  Total cholesterol 251, triglycerides 198, HDL 37.3,  LDL 171.6.  Liver function tests are within normal limits.   ASSESSMENT:  Christine Daniels is doing fine, started fish oil on her own, but  has delayed starting Questran.  Her lipid panel is very similar to last  time.  Her triglycerides are slightly improved, but not at goal.  Her  HDL and LDL remain not at goal.  She has been continuing with her heart-  healthy diet, eating red meat and reports very rarely.  She continues to  walk daily when the weather is good and plays golf once a week.   PLAN:  I asked her to increase her fish oil to 2400 to 3600 mg per day.  We also are going to start Questran this time starting at a low dose and  increasing to ideal dose of 8 g twice daily.  We talked about potential  drug interactions and the need to separate its administration from her  other medications.  We also talked briefly about the possible GI side  effects, and I asked her to call us with any problems or questions until  we see her next time and we congratulated her on her exercise and diet  and asked her to continue with that.  We are going to follow up with  her  in about 10 weeks with a repeat liver and lipid panel and make any  changes at that time there as necessary.      Charolotte Eke, PharmD  Electronically Signed      Rollene Rotunda, MD, [facility]  Electronically Signed   TP/MedQ  DD: 06/03/2008  DT: 06/04/2008  Job #: [id number]

## 2011-01-23 NOTE — Assessment & Plan Note (Signed)
[facility]                            CARDIOLOGY OFFICE NOTE   JAYLENN, BAIZA                      MRN:          [id number]  DATE:11/04/2008                            DOB:          [birth year]    Christine Daniels returns for followup today.  She is a very pleasant 65-year-  old female with hypertension and hyperlipidemia, as well as  cerebrovascular disease.  When I last saw her, she had developed pedal  edema on Norvasc and we discontinued that and placed her on Toprol 50 mg  p.o. daily.  Since that time, she has felt well.  Her pedal edema has  resolved.  There has been no chest pain, shortness of breath or  palpitations, and there is no fatigue.  Also, of note, that her blood  pressure apparently is well controlled with a systolic of 130 or less  and her diastolic in the 85 or less range.   MEDICATIONS:  1. Calcium and vitamin D.  2. Aspirin 81 mg p.o. daily.  3. Multivitamin.  4. Vitamin C.  5. Folate.  6. Vitamin E.  7. Triamterene and hydrochlorothiazide 75/50 one p.o. daily.  8. Omega-3 fish oil.  9. Toprol 50 mg p.o. daily.   PHYSICAL EXAMINATION:  VITAL SIGNS:  Blood pressure of 130/80 and her  pulse is 60.  She weighs 169 pounds.  HEENT:  Normal.  NECK:  Supple.  CHEST:  Clear.  CARDIOVASCULAR:  Regular rate.  ABDOMEN:  No tenderness.  EXTREMITIES:  No edema.   DIAGNOSES:  1. Hypertension - her blood pressure is adequately controlled on her      present regimen.  We will check a BMET given her Maxzide use.      Note, her pedal edema has resolved on Norvasc.  She will continue      to track her blood pressure at home and continue with lifestyle      modification.  2. Hyperlipidemia - she will continue on her fish oil.  She is being      managed now at the Lipid Clinic.  3. History of migraines.  4. Cerebrovascular disease - this is being followed by Dr. Edilia Bo.      She will continue on her aspirin.     Christine Frieze Jens Som,  MD, [facility]  Electronically Signed    BSC/MedQ  DD: 11/04/2008  DT: 11/04/2008  Job #: [id number]   cc:   Gerrit Friends. Aldona Bar, M.D.

## 2011-01-23 NOTE — H&P (Signed)
Christine Daniels, Christine Daniels               ACCOUNT NO.:  [id number]   MEDICAL RECORD NO.:  [id number]          PATIENT TYPE:  AMB   LOCATION:  [facility]                           FACILITY:  [facility]   PHYSICIAN:  Randye Lobo, M.D.   DATE OF BIRTH:  Jul 12, 1946   DATE OF ADMISSION:  DATE OF DISCHARGE:                              HISTORY & PHYSICAL   CHIEF COMPLAINT:  Vaginal protrusion.   HISTORY OF PRESENT ILLNESS:  The patient is a 65 year old gravida 2,  para 2 Caucasian female, status post bilateral tubal ligation, who  presented to her primary gynecologist, Dr. Annamaria Helling, reporting  vaginal prolapse.  The patient in the office was noted to have pelvic  organ prolapse.  She also reported some difficulty with voiding.  Due to  the extent of her prolapse, she did undergo multichannel urodynamic  testing in the office with reduction of the prolapse, and this  documented no evidence of genuine stress incontinence, and it did  demonstrate normal voiding function.   As the patient wishes for definitive surgical treatment of the  uterovaginal prolapse and she requests that her ovaries are removed.  She does deny a history of genuine stress incontinence.  A plan is now  made to proceed with surgical treatment of the above.   PAST OBSTETRIC AND GYNECOLOGIC HISTORIES:  The patient has had 2 prior  vaginal deliveries.  She is status post bilateral tubal ligation.  She  is not taking any hormone therapy.   PAST MEDICAL HISTORY:  1. Arthritis in her back.  2. Hyperlipidemia.  3. Hypertension.  4. History of migraine headaches.   PAST SURGICAL HISTORY:  Status post bilateral tubal ligation.   MEDICATIONS:  Metoprolol, Lipitor, hydrochlorothiazide/Maxzide, Zomig,  aspirin 81 mg p.o. daily, zonisamide, Fiorinal.   ALLERGIES:  No known drug allergies.   REVIEW OF SYSTEMS:  The patient reports some constipation.   PHYSICAL EXAMINATION:  Height 5 feet 10 inches, weight 170 pounds.  Blood pressure  120/68.  HEENT: Normocephalic, atraumatic.  LUNGS:  Clear to auscultation bilaterally.  HEART:  S1, S2, with a regular rate and rhythm.  ABDOMEN:  Soft and nontender without evidence of hepatosplenomegaly or  organomegaly.  PELVIC EXAM:  Normal external genitalia and urethra.  There are no  lesions of the cervix or the vagina.  There is a third-degree cystocele,  first-degree uterine prolapse, and a first-degree rectocele.  The uterus  is small and nontender, and no adnexal masses are appreciated.  EXTREMITIES:  No edema.   IMPRESSION:  The patient is a 65 year old para 2 Caucasian female with  incomplete uterovaginal prolapse.   PLAN:  The patient will undergo a laparoscopically-assisted vaginal  hysterectomy with bilateral salpingo-oophorectomy, anterior and  posterior colporrhaphy.  Risks, benefits, and alternatives have been  reviewed with the patient, who wishes to proceed.      Randye Lobo, M.D.  Electronically Signed     BES/MEDQ  D:  01/18/2009  T:  01/18/2009  Job:  [id number]

## 2011-01-23 NOTE — Assessment & Plan Note (Signed)
Christine Daniels                            CARDIOLOGY OFFICE NOTE   Christine Daniels, Christine Daniels                      MRN:          [id number]  DATE:09/24/2008                            DOB:          1946/07/24    Christine Daniels is a pleasant 65 year old female, who I have seen in the past  for hypertension as well as hyperlipidemia.  She also has  cerebrovascular disease that is followed by Dr. Edilia Bo in [facility].  Note:  She has been followed in our Lipid Clinic, but has not tolerated any of  the statins.  She is now on fish oil.  She has also been on Maxzide for  her blood pressure and Norvasc 10 mg p.o. daily.  However, now she  developed pedal edema and requested to be seen again.  Note:  She denies  any dyspnea, chest pain, palpitations, or syncope.   Her medications include:  1. Aspirin 81 mg p.o. daily.  2. Multivitamin.  3. Vitamin C.  4. Folate.  5. Vitamin E.  6. Triamterene and hydrochlorothiazide 75/50 mg tablets 1 p.o. daily.  7. Fish oil.  8. Norvasc 10 mg daily.   PHYSICAL EXAMINATION:  VITAL SIGNS:  Blood pressure of 138/87 and her  pulse is 83.  HEENT:  Normal.  NECK:  Supple.  CHEST:  Clear.  CARDIOVASCULAR:  Regular rate and rhythm.  ABDOMEN:  No tenderness.  EXTREMITIES:  No edema.   Electrocardiogram shows sinus rhythm at a rate of 83.  There are no ST  changes noted.  There is poor R-wave progression.   DIAGNOSES:  1. Hypertension - the patient has developed pedal edema on the      Norvasc.  We will discontinue this.  She will continue on her      Maxzide and I will also add Toprol 50 mg p.o. daily.  We can      increase this further as needed.  We could also add an ACE      inhibitor down the road as well.  She will track her blood pressure      at home, and I will see her back in 6-8 weeks, and we will adjust      as indicated.  2. Hyperlipidemia - she will continue on her present medications and      she is being followed in the  Lipid Clinic.  3. History of migraines.  4. Cerebrovascular disease - this is being managed by Dr. Edilia Bo.      She will continue on her aspirin.     Madolyn Frieze Jens Som, MD, [facility]  Electronically Signed    BSC/MedQ  DD: 09/24/2008  DT: 09/25/2008  Job #: [id number]   cc:   Gerrit Friends. Aldona Bar, M.D.

## 2011-01-23 NOTE — Assessment & Plan Note (Signed)
[facility]                               LIPID CLINIC NOTE   RILIE, GLANZ                      MRN:          [id number]  DATE:08/12/2008                            DOB:          02-24-1946    Ms. Saia comes in today for followup of her hyperlipidemia therapy,  which includes fish oil 1200 mg daily.  She was unable to tolerate  Questran.  It caused unbearable bloating and gas.   Her other medications include aspirin 81 mg, multivitamin, vitamin C,  folic acid, vitamin E, Maxzide 75/50, vitamin D, Norvasc as well as  p.r.n. medications Zomig, Zonegran, Flexeril, Fiorinal, and Celebrex.   PHYSICAL EXAMINATION:  VITAL SIGNS:  Weight is 169 and blood pressure is  150/105.  Later on in the visit, it was retaken, and it was 140/100.  Her heart rate was 80.  She has been compliant with her blood pressure  medicines.   LABORATORY DATA:  Total cholesterol 245, triglycerides 246, HDL 38.7,  and LDL 160.1.  Liver function tests were within normal limits.   ASSESSMENT:  Ms. Stahl continues to have high triglycerides and high  LDL.  Her HDL is not too bad, but not at a goal greater than 45.  She is  getting frustrated that we cannot really note down a medication  combination that is going to work for her that she can tolerate.  She  continues to golf and lives a pretty active lifestyle.  Diet is mostly  heart-healthy diet, which she has maintained for quite a while.  Blood  pressure is a little bit high today.  I have told her I would pass that  on to Dr. Jens Som for followup.   PLAN:  We decided to increase her fish oil to 2 tablets twice a day and  then up to 3 or 4 tablets twice a day.  I told her to refrigerate the  capsules as well as take them with food to hopefully avoid side effects.  We are also going to start WelChol, it is similar to Questran, but it is  easier to take being that it is in tablet form.  I am not sure that she  will be  able to tolerate it any better than Questran, but it is worth a  try.  We will start with 2 tablets daily, increasing to 4 tablets daily.  I told her that this needs to be separated from her other medications by  4 hours and that she should take it with plenty of water.  I also told  her when she gets to 4 per day, she could take them all at once or take  2 tablets twice a day.  Prescription was called in for [facility].  I asked  her to continue with her lifestyle habits.  We talked again about how  fortunately her dyslipidemia is likely based on her genetics.  We will  have followup with her in 10 weeks to  assess if these new medication doses are going to work, and she was  asked to call  us with any concerns or questions in the meantime.      Charolotte Eke, PharmD  Electronically Signed      Madolyn Frieze. Jens Som, MD, [facility]  Electronically Signed   TP/MedQ  DD: 08/12/2008  DT: 08/13/2008  Job #: [id number]

## 2011-01-23 NOTE — Procedures (Signed)
CAROTID DUPLEX EXAM   INDICATION:  Follow up carotid artery disease.   HISTORY:  Diabetes:  No.  Cardiac:  No.  Hypertension:  Yes.  Smoking:  No.  Previous Surgery:  No.  CV History:  No.  Amaurosis Fugax No, Paresthesias No, Hemiparesis No.                                       RIGHT             LEFT  Brachial systolic pressure:         115               122  Brachial Doppler waveforms:         WNL               WNL  Vertebral direction of flow:        Antegrade         Antegrade  DUPLEX VELOCITIES (cm/sec)  CCA peak systolic                   78                83  ECA peak systolic                   70                77  ICA peak systolic                   P = 48, M = 141   P = 50, M = 112  ICA end diastolic                   P = 18, M = 53    P = 21, M = 52  PLAQUE MORPHOLOGY:                  Homogenous        Homogenous  PLAQUE AMOUNT:                      Mild/Moderate     Mild/Moderate  PLAQUE LOCATION:                    ICA/CCA           ICA/CCA   IMPRESSION:  1. Bilateral mid/distal internal carotid arteries show evidence of 40-      59% stenosis.  2. No significant changes from previous study.       ___________________________________________  Di Kindle. Edilia Bo, M.D.   AS/MEDQ  D:  12/28/2008  T:  12/28/2008  Job:  [id number]   cc:   Madolyn Frieze. Jens Som, MD, [facility]

## 2011-01-23 NOTE — Assessment & Plan Note (Signed)
[facility]                               LIPID CLINIC NOTE   Christine Daniels, Christine Daniels                      MRN:          [id number]  DATE:04/22/2008                            DOB:          October 06, 1945    Christine Daniels comes in today for followup of her hyperlipidemia therapy,  which has been most recently Zetia 10 mg daily, although she took that  for just 29 days and had the recurrence of leg pain very similar to what  she experienced on multiple statins in the past.  After discontinuing  the Zetia, her leg pain lessened gradually and has now completely  resolved.   CURRENT MEDICATIONS:  1. Aspirin 81 mg.  2. Multivitamin.  3. Vitamin C.  4. Folic acid.  5. Vitamin E.  6. Maxzide 75/50.  7. Vitamin D.  P.r.n. medications:  1. Zomig.  2. Flexeril.  3. Fiorinal.  4. Celebrex.   PHYSICAL EXAMINATION:  VITAL SIGNS:  Weight of 170 pounds.  Blood  pressures is 150/90, heart rate is 82.  She reports that her blood  pressure has been running high lately.   LABORATORY DATA:  Total cholesterol 251, triglycerides 222, HDL 40.2 and  LDL 173.  Liver function tests are within normal limits.   ASSESSMENT:  Christine Daniels most recent lipid panel is indicative of her  being on no pharmacological therapy.  Her LDL is quite elevated, the  goal being less than 70.  HDL is not terribly bad but is not at goal of  greater than 45 and her triglycerides are elevated above the goal of  less than 150.  She has been continuing with heart healthy diet.  She  appears to be feeling good.  She has been golfing once a week at least  and is quite happy that her leg pain has resolved.   PLAN:  I talked to Christine Daniels about different options since she has  failed statins and Zetia.  We talked about fibrates, but she is  reluctant or she is hesitant to try those given the chance that they too  can cause myopathies.  We also talked about bile acid sequestrants being  option as they  are absorbent to the blood stream and have no reports of  muscle aches and pains.  We talked about the difficulties or other  complications with bile acid sequestrants being on multiple tablets per  day, potential GI side effects of bloating, constipation and diarrhea.  She is willing to try a drug from this class, so we are going to start  Questran 4 g twice daily and asked her to do that for at least 2 weeks  and then increase to 8 g b.i.d.  I encouraged her with diet and  exercise.  An electronic prescription was sent to the [facility] in  [place].  If this medication is  not tolerated well, we think we will consider a low-dose fibrate.  She  was instructed to call us with any problems or concerns in the meantime.      Charolotte Eke, PharmD  Electronically Signed      Madolyn Frieze. Jens Som, MD, [facility]  Electronically Signed   TP/MedQ  DD: 04/22/2008  DT: 04/23/2008  Job #: [id number]

## 2011-01-23 NOTE — Procedures (Signed)
CAROTID DUPLEX EXAM   INDICATION:  Follow up carotid disease.   HISTORY:  Diabetes:  No.  Cardiac:  No.  Hypertension:  Yes.  Smoking:  No.  Previous Surgery:  No.  CV History:  Asymptomatic.  Amaurosis Fugax No, Paresthesias No, Hemiparesis No.                                       RIGHT             LEFT  Brachial systolic pressure:         120               128  Brachial Doppler waveforms:         Triphasic         Triphasic  Vertebral direction of flow:        Antegrade         Antegrade  DUPLEX VELOCITIES (cm/sec)  CCA peak systolic                   73                74  ECA peak systolic                   61                62  ICA peak systolic                   141               112  ICA end diastolic                   57                52  PLAQUE MORPHOLOGY:                  Homogenous        Homogeneous  PLAQUE AMOUNT:                      Mild-to-moderate  Mild-to-moderate  PLAQUE LOCATION:                    ICA, ECA          ICA, ECA   IMPRESSION:  1. Bilateral mid-to-distal internal carotid artery velocities show      evidence of 40% to 59% stenosis.  No significant changes from      previous study.  2. Bilateral antegrade flow in vertebral arteries.   ___________________________________________  Di Kindle. Edilia Bo, M.D.   NT/MEDQ  D:  01/31/2010  T:  01/31/2010  Job:  [id number]   cc:   Madolyn Frieze. Jens Som, MD, [facility]

## 2011-01-23 NOTE — Assessment & Plan Note (Signed)
[facility]                            CARDIOLOGY OFFICE NOTE   Christine, Daniels                      MRN:          [id number]  DATE:10/20/2007                            DOB:          03/15/1946    Christine Daniels is a very pleasant 65 year old female who I am asked to  evaluate for hyperlipidemia.  She has no prior cardiac history, although  she does state that she has mild carotid disease that is followed by Dr.  Edilia Daniels of [facility].  Typically, she does not have dyspnea on exertion,  orthopnea, PND, pedal edema, palpitations, pre-syncope, syncope or  exertional chest pain.  There is no claudication.  She does have a  history of hyperlipidemia.  I do have the recent laboratories from Dr.  Aldona Daniels drawn on October 03, 2007.  At that time, her total cholesterol was  243 with an HDL of 42 and an LDL of 160.  She has been tried on Crestor  as well as Zocor, but both of these cause myalgias.  We are now asked  further evaluate.   CURRENT MEDICATIONS:  1. Maxzide 75/50 daily.  2. Aspirin 81 mg daily.  3. Multivitamin daily.  4. Vitamin C.  5. Folate.  6. Vitamin E.  7. She also takes Zomig p.r.n.  8. Flexeril p.r.n.  9. Fiorinal p.r.n.  10.Celebrex 200 mg p.o. daily.   She has NO KNOWN DRUG ALLERGIES.   SOCIAL HISTORY:  She has remote history of minimal tobacco use, but has  not smoked in years.  She rarely consumes alcohol.   FAMILY HISTORY:  Is negative for coronary disease.   PAST MEDICAL HISTORY:  Significant for hypertension and hyperlipidemia.  There is no diabetes mellitus.  She has had a previous tubal ligation.  She also has a cervical disk problem and has had previous tonsillectomy.  She has had benign breast biopsy.  There is no other past medical  history noted.   REVIEW OF SYSTEMS:  She denies any headaches or fevers or chills at  present.  There is no productive cough, hemoptysis.  No dysphagia or  odynophagia, melena, or  hematochezia.  There is no dysuria, hematuria.  There is no rash or seizure activity.  There is no orthopnea, PND or  pedal edema.  Remaining systems are negative.   PHYSICAL EXAMINATION:  Today, shows a blood pressure that is elevated at  163/89 and pulse is 95.  She weighs 173 pounds.  She is well-developed,  well-nourished in no acute distress.  SKIN: Warm and dry.  She does not appear to be depressed.  There is no peripheral clubbing.  Her back is normal.  HEENT: Is normal with normal eyelids.  NECK: Supple, with a normal upstroke bilaterally. Cannot appreciate  bruits. There is no jugular venous distention and no thyromegaly is  noted.  CHEST: Clear to auscultation, normal expansion.  CARDIOVASCULAR: Reveals a regular rate and rhythm, normal S1, S2. There  are no murmurs, rubs or gallops noted.  ABDOMEN: Nontender. Positive bowel sounds. No hepatosplenomegaly. No  masses appreciated. There is no abdominal bruit.  She has 2+ femoral pulses bilaterally. No bruits.  EXTREMITIES: Show no edema.  I can palpate no cords. She has 2+  posterior tibial pulses bilaterally.  NEUROLOGIC: Examination is grossly intact.   Her electrocardiogram shows sinus rhythm at rate of 89.  The axis is  normal.  No ST changes noted.   DIAGNOSIS:  1. Hyperlipidemia - Christine Daniels has a an elevated LDL and other risk      factors including hypertension.  We will try Pravachol 40 mg p.o.      nightly to see if she will tolerate.  If so, we will check lipids      and liver in 6 weeks.  If she does not tolerate then I will most      likely refer her to our Lipid Clinic for further discussion of non      statin therapy.  2. Hypertension - She will continue on a diuretic for now.  A blood      pressure is elevated today, but we will track this.  If it remains      elevated then will add additional medications such as an ACE      inhibitor or Norvasc.  3. A history of migraines - Per primary care physician.   4. Cerebrovascular disease - We will have her most recent carotid      Dopplers forwarded to [place] for our records.  Given that she has mild      plaque in the past, I think it is even more important that she be      on a statin if she will tolerate.   I will see back in 8 weeks.     Christine Frieze Jens Som, MD, [facility]  Electronically Signed    BSC/MedQ  DD: 10/20/2007  DT: 10/20/2007  Job #: [id number]   cc:   Christine Daniels. Christine Daniels, M.D.

## 2011-01-23 NOTE — Assessment & Plan Note (Signed)
[facility]ALTH[facility]                            CARDIOLOGY OFFICE NOTE   [name], [name]                      MRN:          [id number]  DATE:12/24/2007                            DOB:          08/16/1946    Christine Daniels is a very pleasant 65 year old female that I recently saw on  October 20, 2007, secondary to hyperlipidemia.  She also has a history  of cerebrovascular disease.  We do not have the most recent reports  available, but this is being followed by Dr. Edilia Bo.  The most recent  one that I have available is from December 25, 2006.  There was a 40-59%  bilateral internal carotid artery stenosis, and this is being managed  medically.  Note, since when I last saw her, we noted that she had not  tolerated Crestor or Zocor in the past.  We did begin Pravachol due to  an LDL of 160.  We began at 40 mg p.o. daily and her follow-up labs  showed a total cholesterol of 186 with an LDL of 109.  We increased her  Pravachol to 80 mg p.o. daily, and we are awaiting follow-up labs.  Since that time, she denies any myalgias, dyspnea, chest pain or  syncope.  She was recently seen by one of her physician's for migraine  headaches and atenolol was recommended both for her blood pressure and  her migraines.   PRESENT MEDICATIONS:  1. Aspirin 81 mg p.o. daily.  2. Multivitamin.  3. Vitamin C.  4. Folate.  5. Vitamin E.  6. Pravachol 80 mg daily.  7. Triamterene/hydrochlorothiazide 75/50 daily.  8. Vitamin D.   PHYSICAL EXAMINATION:  VITAL SIGNS:  Shows a blood pressure of 151/87  and her pulse is 91.  She weighs 172 pounds.  HEENT:  Normal.  NECK:  Supple.  CHEST:  Clear.  CARDIOVASCULAR:  Regular rhythm.  ABDOMEN:  Shows no tenderness.  EXTREMITIES:  Show no edema.   DIAGNOSES:  1. Hyperlipidemia - Mrs. Moritz appears to be tolerating Pravachol.      She is now on 80 mg p.o. daily, and we will await her follow-up      lipids and liver.  We will also refer  her to our lipid clinic for      long-term follow-up of this particular issue.  Her goal LDL should      be less than 70, given her history of cerebrovascular disease.      However, note she has not tolerated Zocor or Crestor one the past.      If her LDL is not controlled, we may try Lipitor.  2. Hypertension - blood pressure is elevated.  I have recommended that      she began the atenolol as prescribed by the physician taking care      of her migraines.  Our goal systolic blood pressure should be less      than 140 and the diastolic should be less than 85.  3. History of migraines.  4. Cerebrovascular disease - this is being managed per Dr. Edilia Bo.  She will be followed in our lipid clinic and I will see her back on as-  needed basis.  She will follow up [facility] for her blood pressure.     Madolyn Frieze Jens Som, MD, [facility]  Electronically Signed    BSC/MedQ  DD: 12/24/2007  DT: 12/24/2007  Job #: [id number]   cc:   Gerrit Friends. Aldona Bar, M.D.

## 2011-01-23 NOTE — Assessment & Plan Note (Signed)
[facility]                               LIPID CLINIC NOTE   Christine, Daniels                      MRN:          [id number]  DATE:01/29/2008                            DOB:          07/09/1946    HISTORY OF PRESENT ILLNESS:  She was seen in the [facility]id [facility] on  Jan 29, 2008.  Christine Daniels comes in today for her initial visit with [place]  here in the Lipid Clinic.   PAST MEDICAL HISTORY:  1. Hypertension.  2. Migraine headaches.  3. Carotid artery stenosis.   CURRENT MEDICATIONS:  1. Aspirin 81 mg.  2. Multivitamin.  3. Vitamin C.  4. Folic acid.  5. Vitamin E.  6. Maxzide.  7. Vitamin D.  8. Pravachol 80 mg daily.  9. In the past, she has had difficulty tolerating statins with      myalgias while on Zocor and Crestor.  Most recently, she was on      Pravachol 80 mg and also experienced myalgias similar to her      previous experiences, mainly being low back pain in her right      quadriceps, right leg.  It has been severe enough to cause her to      limit the exercise she is getting, and she was even having trouble      just bending over to reach something from the floor.  In addition,      the myalgias were more present on the 40 mg dose and worsened with      80 mg dose.  Since stopping pravastatin approximately one week ago,      the myalgias have been resolving steadily.  She has no known drug      allergies.   SOCIAL HISTORY:  She does not use tobacco products.  She does drink  alcoholic beverages occasionally in social situations.   FAMILY HISTORY:  Family history of cardiac disease includes a father who  died of a hemorrhagic CVA at the age of 43.   PHYSICAL EXAMINATION:  VITAL SIGNS:  Weight to 173 pounds, blood  pressure of 130/75, heart rate is 84.   LABORATORY DATA:  Total cholesterol 173, triglycerides 197, HDL 32.8,  LDL 101.  Liver function tests are within normal limits.   ASSESSMENT:  Mr. Savoia latest labs  represent her being on Pravachol 80  mg, but she has since discontinued it.  None of her numbers were at  goal.  Her triglyceride goal was less than 150.  HDL goal was greater  than 45, and LDL goal was less than 70.  Per Dr. Jens Som due to her  known coronary artery stenosis.  She maintains a generally heart healthy  diet limiting beef to no more than twice a week.  Chicken most of the  time.  Exercise consists of golfing about once a week.  She does ride in  a golf cart, however.  She walks occasionally but not on a regular  basis.   PLAN:  Apparently, when she reported worsening myalgias on Pravachol 80,  she was given a prescription for a Zetia which she has not started yet.  I encouraged her to go ahead and start Zetia 10 mg daily and that we  would see what its effect on her cholesterol panel would be.  I  anticipate it will not lower her LDL to the less than 70 goal, but it is  a good place to start.  I do not think we will be trying another statin  in her as she has failed three including Pravachol, which is much less  apt to cause myalgias.  I encouraged her to continue with a heart  healthy diet and exercise a little bit more, especially with the better  weather.  I asked her to walk it for 30 minutes at a time at least 3  days of the week.  We are going to follow up with her in 12 weeks with  repeat liver and lipid panel and make changes necessary at that time.  We may consider a fibrate, especially if her triglycerides are not at  goal, less than 150.      Charolotte Eke, PharmD  Electronically Signed      Bevelyn Buckles. Bensimhon, MD  Electronically Signed   TP/MedQ  DD: 01/29/2008  DT: 01/29/2008  Job #: [id number]

## 2011-01-23 NOTE — Assessment & Plan Note (Signed)
[facility]                               LIPID CLINIC NOTE   Christine Daniels, Christine Daniels                      MRN:          [id number]  DATE:11/05/2008                            DOB:          Jan 23, 1946    Christine Daniels is seen with in the Lipid Clinic for further evaluation and  medication titration.  Associated with her hyperlipidemia therapy, she  is currently taking 2 capsules of fish oil daily.  She follows a low-  fat, low-cholesterol diet.  She exercises playing golf or walking  several times a week for 30-45 minutes.  She is working to increase her  pace and will increase her time spent in this activity as weather  improves.  She has been compliant with her fish oil.  She does not smoke  tobacco or use tobacco products.  She has very limited alcohol intake  due to her migraines.   She has multiple medication intolerances associated with statins, Zetia.   She has been unable to tolerate more than 2 capsules of fish oil on a  daily basis.  [facility] has yielded GI distress and [facility] has  aggravated that even more.  She saw Dr. Jens Som today and metoprolol  has decreased her ability to increase her heart rate and exercise.   PHYSICAL EXAMINATION:  Weight is 169 pounds, blood pressure is 130/80,  respirations are 16.   Labs on November 01, 2008, revealed total cholesterol 252, triglycerides  359, HDL 34.8, LDL 158.8.  LFTs within normal limits.   ASSESSMENT:  This is a 65 year old female with a history of intolerance  to multiple meds is continuing to be very saddened about her inability  to tolerate these therapies.  She is willing to try anything to get her  numbers under better control.  Her lipid panel has stayed high in all  categories, and the triglycerides are now more than 100 points  increased.  Her primary goal was LDL.   PLAN:  She will begin Lipitor 10 mg which is one-half of a 20 mg sample  of which she has been given 14.  She will  take this every other day with  the goal to hopefully increase Lipitor.  We left her a message to also  try coenzyme Q10 a 100 mg twice a day or 150 mg once daily as well as a  statin to see if this helps her improve the myalgias that she sometimes  experiences.  She will work to increase her exercise activity, and she  will follow up in 2 months.  She will call if muscle aches, pains,  weakness, fatigue, or other problems.  We will be glad to move up her  appointment and draw a CK cholesterol panel earlier if need be.  I  appreciate the opportunity to see this patient and make her participate  in the visit.      Shelby Dubin, PharmD, BCPS, CPP  Electronically Signed      Rollene Rotunda, MD, [facility]  Electronically Signed   MP/MedQ  DD: 11/05/2008  DT: 11/05/2008  Job #:  [id number]   cc:   Madolyn Frieze. Jens Som, MD, [facility]

## 2011-01-23 NOTE — Op Note (Signed)
Christine Daniels, Christine Daniels               ACCOUNT NO.:  [id number]   MEDICAL RECORD NO.:  [id number]          PATIENT TYPE:  INP   LOCATION:  9309                          FACILITY:  [facility]   PHYSICIAN:  Randye Lobo, M.D.   DATE OF BIRTH:  [birth year]   DATE OF PROCEDURE:  01/18/2009  DATE OF DISCHARGE:                               OPERATIVE REPORT   PREOPERATIVE DIAGNOSIS:  Incomplete uterovaginal prolapse.   POSTOPERATIVE DIAGNOSIS:  Incomplete uterovaginal prolapse.   PROCEDURE:  Laparoscopically-assisted vaginal hysterectomy with  bilateral salpingo-oophorectomy, McCall culdoplasty, anterior and  posterior colporrhaphy.   SURGEON:  Randye Lobo, MD   ASSISTANT:  Gerrit Friends. Aldona Bar, MD   ANESTHESIA:  General endotracheal.   IV FLUIDS:  1800 mL Ringer lactate.   ESTIMATED BLOOD LOSS:  200 mL.   URINE OUTPUT:  300 mL.   COMPLICATIONS:  None.   INDICATIONS FOR THE PROCEDURE:  The patient is a 65 year old para 2  Caucasian female, status post bilateral tubal ligation, who presented to  our primary gynecologist, Dr. Annamaria Helling, with the report of vaginal  protrusion.  Dr. Aldona Bar diagnosed the patient with uterovaginal prolapse.  The patient had a third-degree cystocele.  She reported some voiding  difficulty and Dr. Aldona Bar ordered urodynamic testing.  This was performed  with packing.  It documented normal voiding function and no sign of  genuine stress incontinence.  A plan is therefore made to proceed now  with a laparoscopically-assisted vaginal hysterectomy with bilateral  salpingo-oophorectomy, and anterior and posterior colporrhaphy after  risks, benefits, and alternatives are reviewed.   FINDINGS:  Examination under anesthesia revealed a third-degree  cystocele, first-degree uterine prolapse, evidence of some cervical  scarring consistent with potential exocervical adhesion, and a first-  degree rectocele.   Laparoscopy demonstrated a normal uterus, tubes, ovaries, liver,  gallbladder, and appendix.  There is no evidence of any adhesive disease  throughout the abdomen or the pelvis.   SPECIMEN:  The uterus, tubes and ovaries were sent to Pathology.   PROCEDURE IN DETAIL:  The patient was reidentified in the preoperative  hold area.  The patient was taken down to the operating room after  receiving cefotetan IV for antibiotic prophylaxis and TED hose and PAS  stockings for DVT prophylaxis.   In the operating room, general endotracheal anesthesia was induced.  The  patient was placed in the dorsal lithotomy position.  The abdomen and  the vagina were sterilely prepped and draped.  The Foley catheter was  placed inside the bladder.  A speculum was placed on the vagina.  A  single-tooth tenaculum was placed on the anterior cervical lip and this  was replaced with a Hulka tenaculum.  The remaining vaginal instruments  were removed.   Attention was turned to the abdomen where a 1 cm umbilical incision was  created sharply with a scalpel.  This was carried down to the fascia  using an Allis clamp.  A 10-mm trocar was inserted directly into the  peritoneal cavity without difficulty.  The laparoscope confirmed proper  placement.  The pneumoperitoneum was achieved and  the patient was placed  in the Trendelenburg position.   A 5-mm right and left lower quadrant incisions were then created and 5-  mm trocars were placed under direct visualization of the laparoscope.  An inspection of the pelvic and abdominal organs was performed.   The procedure began with attention to the left ureter which was  identified prior to using the Gyrus instrument to grasp the left  infundibulopelvic ligament, triply cauterizing, and then cutting.  The  Gyrus instrument was used to cauterize and cut through the posterior  leaf of the broad ligament.  The left round ligament was then cauterized  and was bisected with the Gyrus.  The dissection then continued through  the anterior leaf  of the broad ligament using the Gyrus instrument.  The  bladder flap was sharply created on the patient's left-hand side.   The same procedure that was performed on the left-hand side was then  repeated on the right-hand side.  The dissection was not carried down as  far on the patient's right-hand side so that the bladder flap was not  created on this side.   There was some bleeding from the specimen side of the adnexa and the  utero-ovarian ligament and the proximal fallopian tube was therefore  cauterized, but not cut.   The remainder of the procedure was then performed vaginally.   The patient was placed in high lithotomy position.  A weighted speculum  was placed inside the vagina.  A tenaculum was placed on the anterior  and posterior cervical lips.  The cervix was injected with 0.5%  lidocaine with 1:200,000 of epinephrine.  The cervix was circumscribed  with a scalpel.  The posterior cul-de-sac was entered sharply with the  Mayo scissors.  A weighted speculum was placed in the posterior cul-de-  sac.  Each of the uterosacral ligaments were clamped, sharply divided,  suture ligated with transfixing sutures of 0 Vicryl.  The bladder was  dissected off of the anterior lower uterine segment.  The anterior cul-  de-sac was entered sharply at this time.  Digital exam confirmed proper  entry into this location.  A Deaver retractor was placed in the anterior  cul-de-sac.  The cardinal ligaments were then clamped, sharply divided,  and suture ligated with 0 Vicryl.  One final clamp was placed across the  small amount of broad ligament which remained on each side.  These  pedicles were sharply divided and then were tied with free ties of 0  Vicryl.  The specimen was then removed and sent to Pathology.   The posterior vaginal cuff was then whipstitched with a running locked  suture of 0 Vicryl.  The pedicles were noted to be hemostatic.  A  culdoplasty was performed by placing a suture  of 0 Vicryl through the  posterior vaginal cuff and entered the cul-de-sac down through the  distal left uterosacral ligament.  The suture was brought across the  posterior cul-de-sac in a purse-string fashion.  The suture was brought  through the distal right uterosacral ligament and then out through the  vaginal cuff and into the vagina again at the 6 o'clock position.  The  suture was held and tied at the end of the case, and it provided  excellent elevation and support to the vaginal cuff.   The anterior colporrhaphy was performed next.  The Allis clamps were  used to mark the midline of the vaginal mucosa from the vaginal cuff all  the way  to the urethrovesical junction.  The mucosa was injected with  0.5% lidocaine with 1:200,000 of epinephrine.  The vaginal mucosa was  then incised in the midline with a Metzenbaum scissors.  The subvaginal  tissue and bladder were dissected off of the vaginal mucosa bilaterally.  The dissection was carried from the level of the urethrovesical junction  down to the level of the uterosacral ligaments.  Hemostasis was  excellent.  A vertical mattress sutures of 2-0 Vicryl were then used to  reduce the cystocele.  Excess vaginal mucosa was trimmed and the  anterior vaginal wall was then closed with a running locked suture of 2-  0 Vicryl.  The vaginal cuff was then closed with a running locked suture  of 0 Vicryl.   The posterior colporrhaphy was performed next.  Allis clamps were used  to mark the posterior vaginal hymen and the midline of the posterior  vaginal mucosa.  The perineal body and the posterior vaginal mucosa were  then injected locally with 0.5% lidocaine with 1:200,000 of epinephrine.  A triangular wedge of epithelium was excised from the perineal body.  The vaginal mucosa was then incised vertically in the midline using a  Metzenbaum scissors.  The perirectal fascia was dissected off the  vaginal mucosa bilaterally up to the top of  the rectocele.  Hemostasis  was created with monopolar cautery.  The rectocele was then reduced.  The top suture was a purse-string suture of 2-0 Vicryl.  This  transitioned to vertical mattress sutures of 2-0 Vicryl in the upper  half of the vagina and vertical mattress sutures of 0 Vicryl on the  lower half of the vagina.  Excess vaginal mucosa was trimmed.  Rectal  exam confirmed the absence of sutures in the rectum.  There was a small  defect noted at the top of the colporrhaphy repair and a figure-of-eight  suture of 2-0 Vicryl was placed in this region.  As the posterior  vaginal wall was being closed with a running locked suture of 2-0  Vicryl, there was some bleeding noted from high on the colporrhaphy  repair.  Cautery was not adequate to create hemostasis.  Some of the  sutures of the posterior mucosal closure were loosened, so that adequate  access to this region could be performed.  Figure-of-eight sutures of 2-  0 Vicryl between the margin of the vaginal mucosa and the posterior  repair were placed bilaterally.  This did then create good hemostasis.  The remainder of the vaginal mucosa was then closed.  The repair was  continued along the perineum using standard technique for an episiotomy  repair.  The knot was tied at the hymen.  One additional interrupted  suture was placed along the perineal body to assist with closure of the  epithelium.   The culdoplasty suture was tied and there was good support of the  vaginal cuff.  Hemostasis was excellent at this time.  A vaginal packing  with Estrace cream was placed inside the vagina.   Final laparoscopy was performed.  The pelvis was irrigated and  suctioned.  There was a small amount of bleeding noted on the underside  of the bladder at the level of the vaginal cuff and this responded to  brief cautery with a Gyrus instrument.  All of the remaining pedicles  were hemostatic and the procedure was therefore completed.  The  5-mm  trocars were removed under visualization of the laparoscope.  The  umbilical trocar and  the laparoscope were removed simultaneously.   All skin incisions were closed with inverted subcuticular sutures of 4-0  Vicryl followed by Dermabond on the skin.   This concluded the patient's procedure.  There were no complications.  All needle, instrument, and sponge counts were correct.      Randye Lobo, M.D.  Electronically Signed     BES/MEDQ  D:  01/18/2009  T:  01/18/2009  Job:  [id number]

## 2011-01-25 NOTE — Procedures (Unsigned)
CAROTID DUPLEX EXAM  INDICATION:  Follow up carotid disease.  HISTORY: Diabetes:  No. Cardiac:  No. Hypertension:  Yes. Smoking:  No. Previous Surgery: CV History:  Chronic headaches. Amaurosis Fugax No, Paresthesias No, Hemiparesis No.                                      RIGHT             LEFT Brachial systolic pressure:         148               150 Brachial Doppler waveforms:         WNL               WNL Vertebral direction of flow:        Antegrade         Antegrade DUPLEX VELOCITIES (cm/sec) CCA peak systolic                   74                70 ECA peak systolic                   48                45 ICA peak systolic                   38 (P); 207 (M)   35 (P); 127 (M) ICA end diastolic                   15 (P); 87 (M)    12 (P); 49 (M) PLAQUE MORPHOLOGY: PLAQUE AMOUNT: PLAQUE LOCATION:  IMPRESSION: 1. Widely patent proximal internal carotid arteries bilaterally with     significantly increased velocities in the mid to distal segments.     True disease is difficult  to visualize due to location and     tortuosity. 2. Suspect fibromuscular dysplasia. 3. Bilateral vertebral arteries are within normal limits.  ___________________________________________ Di Kindle. Edilia Bo, M.D.  LT/MEDQ  D:  01/22/2011  T:  01/22/2011  Job:  [id number]

## 2011-01-26 NOTE — Discharge Summary (Signed)
NAMEDAMARY, DOLAND               ACCOUNT NO.:  [id number]   MEDICAL RECORD NO.:  [id number]          PATIENT TYPE:  INP   LOCATION:  9309                          FACILITY:  [facility]   PHYSICIAN:  Randye Lobo, M.D.   DATE OF BIRTH:  1946/01/02   DATE OF ADMISSION:  01/18/2009  DATE OF DISCHARGE:  01/19/2009                               DISCHARGE SUMMARY   DATE OF ADMISSION:  Jan 18, 2009   DATE OF DISCHARGE:  Jan 19, 2009   ADMISSION DIAGNOSES:  1. Incomplete uterovaginal prolapse.  2. Hypokalemia.   DISCHARGE DIAGNOSES:  1. Incomplete uterovaginal prolapse.  2. Status post laparoscopically-assisted vaginal hysterectomy with      bilateral salpingo-oophorectomy, McCall culdoplasty, anterior and      posterior colporrhaphy.  3. Hypokalemia.   SIGNIFICANT OPERATIONS AND PROCEDURES:  The patient underwent a  laparoscopically-assisted vaginal hysterectomy with bilateral salpingo-  oophorectomy, McCall culdoplasty, anterior and posterior colporrhaphy on  Jan 18, 2009, under the direction of Dr. Conley Simmonds with the assistance  of Dr. Annamaria Helling.   ADMISSION HISTORY AND PHYSICAL EXAMINATION:  The patient is a 65-year-  old, para 2, Caucasian female, status post bilateral tubal ligation who  presented for primary gynecologist, Dr. Annamaria Helling, with a symptom of  vaginal protrusion.  The patient was diagnosed with uterovaginal  prolapse.  The patient had a large cystocele and some difficulty with  voiding, and therefore underwent multichannel urodynamic testing with  reduction of the prolapse.  This did document normal voiding function  and no evidence of stress incontinence.   The patient's medical history was significant for hypertension,  hyperlipidemia, arthritis in her back, and migraine headaches.   Her current medications included metoprolol, Lipitor,  hydrochlorothiazide/Maxzide, Zomig, zonisamide, Fioricet p.r.n.Marland Kitchen   Pelvic exam revealed a third-degree cystocele,  first-degree uterine  prolapse, and a first-degree rectocele.  The uterus was small and  nontender, and no adnexal masses were appreciated.   A preoperative potassium level was 3.0, the glucose was 121, and the  hematocrit was 41.2%.   HOSPITAL COURSE:  The patient was admitted on Jan 18, 2009, at which  time she underwent a laparoscopically-assisted vaginal hysterectomy with  bilateral salpingo-oophorectomy, McCall culdoplasty, and anterior and  posterior colporrhaphy which was performed without complication.  Intraoperative findings demonstrated a third-degree cystocele, first-  degree uterine prolapse, and a first-degree rectocele.  There was some  cervical scarring which appeared to be consistent with a potential  endocervical adhesion.  Laparoscopy demonstrated normal uterus, tubes,  ovaries, liver, gallbladder, and appendix.  There was no adhesive  disease in the abdomen or pelvis.   Postoperatively, the patient received 20 mEq of KCl IV.  By the next  morning, the potassium was measured at 2.7.  The patient did not require  her antihypertensive treatment during her hospitalization as her blood  pressure was normal.   The patient had an otherwise unremarkable hospital stay.  She had good  control of her pain and used her PCA minimally.  She was able to take  oral pain medication including Percocet and Motrin on postoperative  day  #1.  She took a regular diet without difficulty.  The patient was able  to ambulate independently and void spontaneously after her vaginal  packing, and Foley catheter were removed.   The patient's incisions remained clean, dry, and intact.  There was no  significant vaginal bleeding.   The patient's discharge hemoglobin was 12.2 on Jan 19, 2009.  Her final  pathology report was pending at the time of her discharge.   The patient was found to be in good condition and ready for discharge on  postoperative day #1.   DISCHARGE INSTRUCTIONS:  1.  Discharged to home.  2. The patient will take the following medications:  Percocet 5 mg/325      mg 1-2 p.o. q.4-6 hours p.r.n. pain, ibuprofen 600 mg p.o. q.6      hours p.r.n. pain, potassium chloride 20 mEq 1 p.o. b.i.d.  3. The patient will follow a regular diet.  4. The patient will have decreased activity.  She will not work for 6      weeks.  She will not drive for 2 weeks.  She will not lift anything      over 10 pounds for 12 weeks.  She will have no sexual activity for      the next 8-12 weeks.   The patient will follow up in the office in 5 days for a recheck.   The patient will call the office if she experiences any problems with  fever, nausea and vomiting, drainage or redness from her incisions,  heavy vaginal bleeding, difficulty voiding, or any other concern.      Randye Lobo, M.D.  Electronically Signed     BES/MEDQ  D:  02/01/2009  T:  02/01/2009  Job:  [id number]

## 2011-05-28 ENCOUNTER — Ambulatory Visit: Payer: Commercial Managed Care - PPO

## 2011-05-28 DIAGNOSIS — E7800 Pure hypercholesterolemia, unspecified: Secondary | ICD-10-CM

## 2011-05-28 DIAGNOSIS — Z79899 Other long term (current) drug therapy: Secondary | ICD-10-CM

## 2011-05-28 LAB — LIPID PANEL
Cholesterol: 217 mg/dL — ABNORMAL HIGH (ref 0–200)
VLDL: 38.6 mg/dL (ref 0.0–40.0)

## 2011-05-28 LAB — HEPATIC FUNCTION PANEL
ALT: 22 U/L (ref 0–35)
Albumin: 4.3 g/dL (ref 3.5–5.2)
Alkaline Phosphatase: 65 U/L (ref 39–117)
Bilirubin, Direct: 0.1 mg/dL (ref 0.0–0.3)
Total Protein: 6.8 g/dL (ref 6.0–8.3)

## 2011-05-31 ENCOUNTER — Ambulatory Visit (INDEPENDENT_AMBULATORY_CARE_PROVIDER_SITE_OTHER): Payer: Commercial Managed Care - PPO

## 2011-05-31 VITALS — Wt 157.0 lb

## 2011-05-31 DIAGNOSIS — E785 Hyperlipidemia, unspecified: Secondary | ICD-10-CM

## 2011-05-31 NOTE — Patient Instructions (Signed)
Start taking Niacin 500mg  once daily.  If you experience flushing, take your aspirin 30 minutes prior to the niacin.    Continue Crestor 5mg  every other day and fish oil 3 capsules daily  Continue your heart healthy diet  Great job on exercising by walking your dog.  Make sure you continue to exercise on a regular basis as the weather changes  Recheck labs in 3-4 months

## 2011-06-06 NOTE — Assessment & Plan Note (Addendum)
Pt's cholesterol: TC- 217 (goal<200), TG- 193 (goal<150), HDL- 44.9 (goal>45) and LDL- 141 (goal<130).  LFTs are WNL.  Lipids relatively the same since last visit.  Given history of myalgias, pt hesitant to increase statin.  Will try addition on Niacin to help lower LDL and TG.  Pt educated on side effects.  Will continue with current lifestyle improvements and weight loss plan.  F/U in 3-4 months.

## 2011-06-06 NOTE — Progress Notes (Signed)
HPI The patient comes in today for dyslipidemia follow-up. She is currently taking Crestor 5mg  every other day and fish oil 3 gm daily.  She was watching Dr. Neil Crouch recently and he suggested taking coconut oil capsules.  She has tried this but not on a consistent basis.  Since last visit, she has made an effort to lose some weight.  She is down 9 lbs since February.    The patient is compliant with her diet. For breakfast she eats cereal (Special K, Total or Cheerios) or a muffin with low-fat milk. For lunch she eats a peanut butter sandwich and has some fruit such as a banana or apple on the side. For dinner she tries to stick with chicken and vegetables such as broccoli, cabbage, squash, tomatoes, and asparagus.  She does cheat occasionally with pizza.  She does not like desserts and drinks mostly water and orange juice.   Since last visit, pt has been more consistent with exercise.  She walks the dog several times a day for a few blocks at a time.  She does golf with a group of friends one day per week.   Current Outpatient Prescriptions  Medication Sig Dispense Refill  . aspirin 81 MG tablet Take 81 mg by mouth daily.        . Coenzyme Q10 (CO Q10) 100 MG TABS Take 1 tablet by mouth 2 (two) times daily.        . fish oil-omega-3 fatty acids 1000 MG capsule Take 3 g by mouth daily.        Marland Kitchen loratadine (CLARITIN) 10 MG tablet Take 10 mg by mouth daily.        . metoprolol succinate (TOPROL-XL) 25 MG 24 hr tablet Take 25 mg by mouth daily.        . Multiple Vitamin (MULTIVITAMIN) tablet Take 1 tablet by mouth daily.        . potassium chloride SA (K-DUR,KLOR-CON) 20 MEQ tablet Take 20 mEq by mouth daily.        . rosuvastatin (CRESTOR) 5 MG tablet Take 5 mg by mouth every other day.        . triamterene-hydrochlorothiazide (MAXZIDE) 75-50 MG per tablet Take 1 tablet by mouth daily.        Marland Kitchen ZOLMitriptan (ZOMIG) 2.5 MG tablet Take 2.5 mg by mouth as needed.        . zonisamide (ZONEGRAN) 100 MG  capsule Take 250 mg by mouth daily.

## 2011-06-08 ENCOUNTER — Other Ambulatory Visit: Payer: Self-pay | Admitting: *Deleted

## 2011-06-08 MED ORDER — METOPROLOL SUCCINATE ER 25 MG PO TB24: 25.0000 mg | ORAL_TABLET | Freq: Every day | ORAL | Status: DC

## 2011-06-22 ENCOUNTER — Encounter: Payer: Self-pay | Admitting: Vascular Surgery

## 2011-06-26 ENCOUNTER — Other Ambulatory Visit: Payer: Self-pay | Admitting: *Deleted

## 2011-06-26 MED ORDER — POTASSIUM CHLORIDE CRYS ER 20 MEQ PO TBCR: 20.0000 meq | EXTENDED_RELEASE_TABLET | Freq: Every day | ORAL | Status: DC

## 2011-08-01 ENCOUNTER — Other Ambulatory Visit: Payer: Commercial Managed Care - PPO

## 2011-08-01 ENCOUNTER — Ambulatory Visit: Payer: Commercial Managed Care - PPO | Admitting: Vascular Surgery

## 2011-08-07 ENCOUNTER — Encounter: Payer: Self-pay | Admitting: Vascular Surgery

## 2011-08-08 ENCOUNTER — Encounter: Payer: Self-pay | Admitting: Vascular Surgery

## 2011-08-08 ENCOUNTER — Other Ambulatory Visit (INDEPENDENT_AMBULATORY_CARE_PROVIDER_SITE_OTHER): Payer: Commercial Managed Care - PPO | Admitting: *Deleted

## 2011-08-08 ENCOUNTER — Ambulatory Visit (INDEPENDENT_AMBULATORY_CARE_PROVIDER_SITE_OTHER): Payer: Commercial Managed Care - PPO | Admitting: Vascular Surgery

## 2011-08-08 VITALS — BP 123/80 | HR 70 | Resp 12 | Ht 70.0 in | Wt 155.0 lb

## 2011-08-08 DIAGNOSIS — I6529 Occlusion and stenosis of unspecified carotid artery: Secondary | ICD-10-CM

## 2011-08-08 DIAGNOSIS — G8929 Other chronic pain: Secondary | ICD-10-CM

## 2011-08-08 DIAGNOSIS — R51 Headache: Secondary | ICD-10-CM

## 2011-08-08 NOTE — Progress Notes (Signed)
[facility]  Patient name: Christine Daniels MRN: [id number] DOB: [birth year] Sex: female  CC: Follow up of carotid disease.  HPI: Christine Daniels is a 65 y.o. female who I have been following since 1999 with carotid disease. Her last carotid duplex scan was in May of 2012. She had increased velocities in the mid to distal segments of both internal carotid arteries. Arteries are noted to be somewhat tortuous with possibility of some fibromuscular dysplasia.  The patient has had no history of stroke, TIAs, expressive or receptive aphasia, or amaurosis fugax.  The patient has a history of hypertension and hypercholesterolemia which are stable and her current medications. She is followed by Dr. Jens Som.  Past Medical History  Diagnosis Date  . Hypertension   . Headache   . Carotid artery occlusion     History reviewed. No pertinent family history.  SOCIAL HISTORY: History  Substance Use Topics  . Smoking status: Former Smoker    Types: Cigarettes    Quit date: 09/10/1977  . Smokeless tobacco: Not on file  . Alcohol Use: 0.5 oz/week    1 drink(s) per week    No Known Allergies  Current Outpatient Prescriptions  Medication Sig Dispense Refill  . aspirin 81 MG tablet Take 81 mg by mouth daily.        . Coenzyme Q10 (CO Q10) 100 MG TABS Take 1 tablet by mouth 2 (two) times daily.        . fish oil-omega-3 fatty acids 1000 MG capsule Take 3 g by mouth daily.        Marland Kitchen loratadine (CLARITIN) 10 MG tablet Take 10 mg by mouth daily.        . metoprolol succinate (TOPROL-XL) 25 MG 24 hr tablet Take 1 tablet (25 mg total) by mouth daily.  30 tablet  6  . Multiple Vitamin (MULTIVITAMIN) tablet Take 1 tablet by mouth daily.        . potassium chloride SA (K-DUR,KLOR-CON) 20 MEQ tablet Take 1 tablet (20 mEq total) by mouth daily.  30 tablet  12  . rosuvastatin (CRESTOR) 5 MG tablet Take 5 mg by mouth every other day.        . triamterene-hydrochlorothiazide  (MAXZIDE) 75-50 MG per tablet Take 1 tablet by mouth daily.        Marland Kitchen ZOLMitriptan (ZOMIG) 2.5 MG tablet Take 2.5 mg by mouth as needed.        . zonisamide (ZONEGRAN) 100 MG capsule Take 250 mg by mouth daily.          REVIEW OF SYSTEMS: Arly.Keller ] denotes positive finding; [  ] denotes negative finding CARDIOVASCULAR:  [ ]  chest pain   [ ]  chest pressure   [ ]  palpitations   [ ]  orthopnea   [ ]  dyspnea on exertion   [ ]  claudication   [ ]  rest pain   [ ]  DVT   [ ]  phlebitis PULMONARY:   [ ]  productive cough   [ ]  asthma   [ ]  wheezing NEUROLOGIC:   [ ]  weakness  [ ]  paresthesias  [ ]  aphasia  [ ]  amaurosis  [ ]  dizziness HEMATOLOGIC:   [ ]  bleeding problems   [ ]  clotting disorders MUSCULOSKELETAL:  [ ]  joint pain   [ ]  joint swelling [ ]  leg swelling GASTROINTESTINAL: [ ]   blood in stool  [ ]   hematemesis GENITOURINARY:  [ ]   dysuria  [ ]   hematuria PSYCHIATRIC:  [ ]   history of major depression INTEGUMENTARY:  [ ]  rashes  [ ]  ulcers CONSTITUTIONAL:  [ ]  fever   [ ]  chills  PHYSICAL EXAM: Filed Vitals:   08/08/11 1125 08/08/11 1126  BP: 134/82 123/80  Pulse: 70 70  Resp: 14 12  Height: 5\' 10"  (1.778 m)   Weight: 155 lb (70.308 kg)   SpO2: 99%    Body mass index is 22.24 kg/(m^2). GENERAL: The patient is a well-nourished female, in no acute distress. The vital signs are documented above. CARDIOVASCULAR: There is a regular rate and rhythm without significant murmur appreciated. I do not detect carotid bruits. PULMONARY: There is good air exchange bilaterally without wheezing or rales. ABDOMEN: Soft and non-tender with normal pitched bowel sounds.  MUSCULOSKELETAL: There are no major deformities or cyanosis. NEUROLOGIC: No focal weakness or paresthesias are detected. SKIN: There are no ulcers or rashes noted. PSYCHIATRIC: The patient has a normal affect.  DATA:  I have independently interpreted her carotid duplex scan which shows no significant carotid disease bilaterally. There are  some elevated velocities in the mid to distal segments but this is most likely due to vessel tortuosity.  MEDICAL ISSUES: I have ordered a follow up carotid duplex scan in 1 year. I'll plan on seeing her back in 2 years and this has been some change in her duplex at one year. She is not a smoker and remains very active. Her risk factors including hypertension hypercholesterolemia are well controlled by her primary medical doctor. She knows to call sooner she has problems. Of note she is on aspirin.  Desiderio Dolata S [facility] [place] Office: [phone]

## 2011-08-15 NOTE — Procedures (Unsigned)
CAROTID DUPLEX EXAM  INDICATION:  Follow up carotid artery disease.  HISTORY: Diabetes:  No. Cardiac:  No. Hypertension:  Yes. Smoking:  No. Previous Surgery:  No. CV History:  Chronic headaches. Amaurosis Fugax No, Paresthesias No, Hemiparesis No.                                      RIGHT             LEFT Brachial systolic pressure:         140               143 Brachial Doppler waveforms:         Normal            Normal Vertebral direction of flow:        Antegrade         Antegrade DUPLEX VELOCITIES (cm/sec) CCA peak systolic                   55                58 ECA peak systolic                   47                45 ICA peak systolic                   140               91 ICA end diastolic                   53                40 PLAQUE MORPHOLOGY:                  Heterogenous      Heterogenous PLAQUE AMOUNT:                      Minimal           Minimal PLAQUE LOCATION:                    CCA               CCA  IMPRESSION: 1. Widely patent internal carotid arteries bilaterally with elevated     velocities in the mid to distal segments, which are most likely due     to vessel tortuosity. 2. Stable in comparison to the last examination.  ___________________________________________ Di Kindle. Edilia Bo, M.D.  EM/MEDQ  D:  08/08/2011  T:  08/08/2011  Job:  [id number]

## 2011-09-13 ENCOUNTER — Other Ambulatory Visit: Payer: Self-pay | Admitting: Cardiology

## 2011-09-15 ENCOUNTER — Other Ambulatory Visit: Payer: Self-pay | Admitting: Cardiology

## 2011-09-16 ENCOUNTER — Other Ambulatory Visit: Payer: Self-pay | Admitting: Cardiology

## 2011-09-17 ENCOUNTER — Other Ambulatory Visit (INDEPENDENT_AMBULATORY_CARE_PROVIDER_SITE_OTHER): Payer: Commercial Managed Care - PPO

## 2011-09-17 DIAGNOSIS — E785 Hyperlipidemia, unspecified: Secondary | ICD-10-CM

## 2011-09-17 LAB — LIPID PANEL
LDL Cholesterol: 121 mg/dL — ABNORMAL HIGH (ref 0–99)
Total CHOL/HDL Ratio: 4
Triglycerides: 140 mg/dL (ref 0.0–149.0)

## 2011-09-18 ENCOUNTER — Other Ambulatory Visit: Payer: Self-pay | Admitting: Cardiology

## 2011-09-18 ENCOUNTER — Other Ambulatory Visit: Payer: Self-pay

## 2011-09-18 LAB — HEPATIC FUNCTION PANEL
AST: 24 U/L (ref 0–37)
Albumin: 4.2 g/dL (ref 3.5–5.2)
Alkaline Phosphatase: 75 U/L (ref 39–117)
Bilirubin, Direct: 0.1 mg/dL (ref 0.0–0.3)
Total Bilirubin: 0.8 mg/dL (ref 0.3–1.2)

## 2011-09-18 MED ORDER — METOPROLOL SUCCINATE ER 25 MG PO TB24: 25.0000 mg | ORAL_TABLET | Freq: Every day | ORAL | Status: DC

## 2011-09-18 NOTE — Telephone Encounter (Signed)
New msg: pt calling stating that she needs 90 day supply of metoprolol 25 mg called into [facility] on [address]. Pt took her last tablet this am. Please call this RX in today.

## 2011-09-20 ENCOUNTER — Ambulatory Visit (INDEPENDENT_AMBULATORY_CARE_PROVIDER_SITE_OTHER): Payer: Commercial Managed Care - PPO

## 2011-09-20 VITALS — BP 130/85 | Wt 158.0 lb

## 2011-09-20 DIAGNOSIS — E785 Hyperlipidemia, unspecified: Secondary | ICD-10-CM

## 2011-09-20 NOTE — Progress Notes (Signed)
HPI:  Christine Daniels is a 62 yoWF presenting to the clinic for dyslipidemia follow up.  For her cholesterol, she takes Fish Oil 1-2 g daily, Niacin 300 mg daily, Crestor 5 mg PO QOD (due to a history of myalgias), and CoQ10 100 mg BID.  Patient reports continued right-sided lower back aches, but it is tolerable.  Due to her history of migraines, she self-titrated the Niacin initiated at the last appointment (started at 100 mg and worked up to 300 mg).  She continues to make healthy dietary choices and has recently incorporated 2-3 apples or other fruits per day for an extra source of fiber.  She has not incorporated routine exercise, but does golf occasionally, walks the dog several times per day, and does yard work.   Current Outpatient Prescriptions  Medication Sig Dispense Refill  . aspirin 81 MG tablet Take 81 mg by mouth daily.        . Coenzyme Q10 (CO Q10) 100 MG TABS Take 1 tablet by mouth 2 (two) times daily.        . fish oil-omega-3 fatty acids 1000 MG capsule Take 1-2 g by mouth daily.       Marland Kitchen loratadine (CLARITIN) 10 MG tablet Take 10 mg by mouth daily.        . metoprolol succinate (TOPROL-XL) 25 MG 24 hr tablet Take 1 tablet (25 mg total) by mouth daily.  90 tablet  1  . niacin 100 MG tablet Take 300 mg by mouth daily.      . potassium chloride SA (K-DUR,KLOR-CON) 20 MEQ tablet Take 1 tablet (20 mEq total) by mouth daily.  30 tablet  12  . rosuvastatin (CRESTOR) 5 MG tablet Take 1 tablet (5 mg total) by mouth every other day.  30 tablet  2  . triamterene-hydrochlorothiazide (MAXZIDE) 75-50 MG per tablet Take 1 tablet by mouth daily.        Marland Kitchen zolmitriptan (ZOMIG) 5 MG tablet Take 5 mg by mouth as needed.      . zonisamide (ZONEGRAN) 100 MG capsule Take 200 mg by mouth daily.       Marland Kitchen zonisamide (ZONEGRAN) 25 MG capsule Take 75 mg by mouth daily.       No Known Allergies

## 2011-09-20 NOTE — Assessment & Plan Note (Signed)
Assessment:  Current lipid panel (09/17/11):  TC 199 (goal <200), TG 140 (goal <150), HDL (goal >45) 50.3, LDL 121 (goal <130).  LFTs are WNL.  All lipids have improved since last visit (05/28/11).  Due to myalgias, discussed option of changing Crestor 5 mg to 3 days per week rather than every other day.  Patient is tolerating Niacin by pre-treating with Aspirin and self-titrating to a dose that does not trigger a migraine.   Plan:   1.  Continue current medications 2.  Consider Crestor 5 mg PO on M, W, F to reduce myalgias 3.  Continue heart healthy diet and exercise 4.  F/U in 6 months in lipid clinic.  If lipid panel continues to be at goal, PCP to follow yearly.

## 2011-09-20 NOTE — Patient Instructions (Addendum)
Continue same medications.  Congratulations on the improvement in your cholesterol.   We will recheck your labs in 6 months.  If you have any problems before then, please call the office.

## 2011-10-16 DIAGNOSIS — I6529 Occlusion and stenosis of unspecified carotid artery: Secondary | ICD-10-CM

## 2011-11-09 ENCOUNTER — Encounter: Payer: Self-pay | Admitting: *Deleted

## 2011-11-12 ENCOUNTER — Encounter: Payer: Self-pay | Admitting: Cardiology

## 2011-11-12 ENCOUNTER — Ambulatory Visit (INDEPENDENT_AMBULATORY_CARE_PROVIDER_SITE_OTHER): Payer: Commercial Managed Care - PPO | Admitting: Cardiology

## 2011-11-12 VITALS — BP 129/68 | HR 72 | Ht 70.0 in | Wt 157.0 lb

## 2011-11-12 DIAGNOSIS — I10 Essential (primary) hypertension: Secondary | ICD-10-CM

## 2011-11-12 NOTE — Assessment & Plan Note (Signed)
Blood pressure controlled. Continue present medications. Potassium and renal function monitored by primary care. 

## 2011-11-12 NOTE — Assessment & Plan Note (Signed)
Continue present medications. Followup lipid clinic.

## 2011-11-12 NOTE — Assessment & Plan Note (Signed)
Continue aspirin and statin. Dopplers are followed by vascular surgery.

## 2011-11-12 NOTE — Progress Notes (Signed)
HPI: Ms. Lepp returns for followup today.  She is a very pleasant  female with hypertension and  hyperlipidemia, as well as cerebrovascular disease. Carotid disease if followed by vascular surgery. I last saw her in March of 2012. Since then the patient denies any dyspnea on exertion, orthopnea, PND, pedal edema, palpitations, syncope or chest pain.  Current Outpatient Prescriptions  Medication Sig Dispense Refill  . aspirin 81 MG tablet Take 81 mg by mouth daily.        . CELEBREX 200 MG capsule prn      . Coenzyme Q10 (CO Q10) 100 MG TABS Take 1 tablet by mouth 2 (two) times daily.        . fish oil-omega-3 fatty acids 1000 MG capsule Take 1-2 g by mouth daily.       Marland Kitchen loratadine (CLARITIN) 10 MG tablet Take 10 mg by mouth daily.        . metoprolol succinate (TOPROL-XL) 25 MG 24 hr tablet Take 1 tablet (25 mg total) by mouth daily.  90 tablet  1  . niacin 100 MG tablet Take 300 mg by mouth daily.      . potassium chloride SA (K-DUR,KLOR-CON) 20 MEQ tablet Take 1 tablet (20 mEq total) by mouth daily.  30 tablet  12  . rosuvastatin (CRESTOR) 5 MG tablet Take 1 tablet (5 mg total) by mouth every other day.  30 tablet  2  . triamterene-hydrochlorothiazide (MAXZIDE) 75-50 MG per tablet Take 1 tablet by mouth daily.        Marland Kitchen zolmitriptan (ZOMIG) 5 MG tablet Take 5 mg by mouth as needed.      . zonisamide (ZONEGRAN) 100 MG capsule Take 200 mg by mouth daily.       Marland Kitchen zonisamide (ZONEGRAN) 25 MG capsule Take 75 mg by mouth daily.         Past Medical History  Diagnosis Date  . Hypertension   . CEREBROVASCULAR DISEASE   . HYPERLIPIDEMIA     Past Surgical History  Procedure Date  . Status post bilateral tubal ligation.     History   Social History  . Marital Status: Married    Spouse Name: N/A    Number of Children: N/A  . Years of Education: N/A   Occupational History  . Not on file.   Social History Main Topics  . Smoking status: Former Smoker    Types: Cigarettes    Quit  date: 09/10/1977  . Smokeless tobacco: Not on file  . Alcohol Use: 0.5 oz/week    1 drink(s) per week  . Drug Use: Not on file  . Sexually Active: Not on file   Other Topics Concern  . Not on file   Social History Narrative   She has remote history of minimal tobacco use, but has  not smoked in years.  She rarely consumes alcohol.     ROS: Occasional back pain but no fevers or chills, productive cough, hemoptysis, dysphasia, odynophagia, melena, hematochezia, dysuria, hematuria, rash, seizure activity, orthopnea, PND, pedal edema, claudication. Remaining systems are negative.  Physical Exam: Well-developed well-nourished in no acute distress.  Skin is warm and dry.  HEENT is normal.  Neck is supple. No thyromegaly.  Chest is clear to auscultation with normal expansion.  Cardiovascular exam is regular rate and rhythm.  Abdominal exam nontender or distended. No masses palpated. Extremities show no edema. neuro grossly intact  ECG sinus rhythm at a rate of 72. No ST changes.

## 2011-11-12 NOTE — Patient Instructions (Signed)
Your physician wants you to follow-up in: 1 year. You will receive a reminder letter in the mail two months in advance. If you don't receive a letter, please call our office to schedule the follow-up appointment.  

## 2011-11-29 ENCOUNTER — Telehealth: Payer: Self-pay | Admitting: Family Medicine

## 2011-11-29 NOTE — Telephone Encounter (Signed)
Pt would like to re-est with MD. Pt will have medicare next month. Pt stated her husband see Dr Tawanna Cooler.

## 2011-12-03 NOTE — Telephone Encounter (Signed)
lmom for pt to call back

## 2011-12-03 NOTE — Telephone Encounter (Signed)
Ok,,,,,,, but next appointment may be 2-3 months for a new patient physical

## 2011-12-04 NOTE — Telephone Encounter (Signed)
Pt is sch for 02-11-2012

## 2011-12-05 ENCOUNTER — Other Ambulatory Visit: Payer: Self-pay | Admitting: *Deleted

## 2011-12-05 MED ORDER — ROSUVASTATIN CALCIUM 5 MG PO TABS: 5.0000 mg | ORAL_TABLET | ORAL | Status: DC

## 2012-02-06 ENCOUNTER — Telehealth: Payer: Self-pay | Admitting: Cardiology

## 2012-02-06 DIAGNOSIS — E7800 Pure hypercholesterolemia, unspecified: Secondary | ICD-10-CM

## 2012-02-06 NOTE — Telephone Encounter (Signed)
Pt called per recall letter to make lipid appt, she states she always has a lipid /liver at [facility] prior, can get order? Will go the week before/mt

## 2012-02-06 NOTE — Telephone Encounter (Signed)
Order placed for labs to be drawn

## 2012-02-11 ENCOUNTER — Encounter: Payer: Self-pay | Admitting: Family Medicine

## 2012-02-11 ENCOUNTER — Ambulatory Visit (INDEPENDENT_AMBULATORY_CARE_PROVIDER_SITE_OTHER): Payer: Medicare Other | Admitting: Family Medicine

## 2012-02-11 VITALS — BP 130/90 | Temp 98.1°F | Wt 156.0 lb

## 2012-02-11 DIAGNOSIS — E876 Hypokalemia: Secondary | ICD-10-CM

## 2012-02-11 DIAGNOSIS — I679 Cerebrovascular disease, unspecified: Secondary | ICD-10-CM

## 2012-02-11 DIAGNOSIS — E785 Hyperlipidemia, unspecified: Secondary | ICD-10-CM

## 2012-02-11 DIAGNOSIS — Z23 Encounter for immunization: Secondary | ICD-10-CM

## 2012-02-11 DIAGNOSIS — Z0000 Encounter for general adult medical examination without abnormal findings: Secondary | ICD-10-CM

## 2012-02-11 DIAGNOSIS — I10 Essential (primary) hypertension: Secondary | ICD-10-CM

## 2012-02-11 DIAGNOSIS — Z8669 Personal history of other diseases of the nervous system and sense organs: Secondary | ICD-10-CM | POA: Insufficient documentation

## 2012-02-11 LAB — HEPATIC FUNCTION PANEL
Albumin: 4.2 g/dL (ref 3.5–5.2)
Alkaline Phosphatase: 79 U/L (ref 39–117)
Total Protein: 7 g/dL (ref 6.0–8.3)

## 2012-02-11 LAB — LIPID PANEL
HDL: 49.2 mg/dL (ref >39.00)
Total CHOL/HDL Ratio: 4
VLDL: 39.2 mg/dL (ref 0.0–40.0)

## 2012-02-11 LAB — CBC WITH DIFFERENTIAL/PLATELET
Basophils Absolute: 0 10*3/uL (ref 0.0–0.1)
Hemoglobin: 15 g/dL (ref 12.0–15.0)
Lymphocytes Relative: 28.4 % (ref 12.0–46.0)
Monocytes Relative: 11.5 % (ref 3.0–12.0)
Neutro Abs: 4.5 10*3/uL (ref 1.4–7.7)
RDW: 12.7 % (ref 11.5–14.6)
WBC: 7.8 10*3/uL (ref 4.5–10.5)

## 2012-02-11 LAB — BASIC METABOLIC PANEL
GFR: 68.52 mL/min (ref >60.00)
Potassium: 3.1 mEq/L — ABNORMAL LOW (ref 3.5–5.1)
Sodium: 139 mEq/L (ref 135–145)

## 2012-02-11 LAB — POCT URINALYSIS DIPSTICK
Bilirubin, UA: NEGATIVE
Blood, UA: NEGATIVE
Glucose, UA: NEGATIVE
Leukocytes, UA: NEGATIVE
Nitrite, UA: NEGATIVE

## 2012-02-11 LAB — LDL CHOLESTEROL, DIRECT: Direct LDL: 139 mg/dL

## 2012-02-11 MED ORDER — METOPROLOL SUCCINATE ER 25 MG PO TB24: 25.0000 mg | ORAL_TABLET | Freq: Every day | ORAL | Status: DC

## 2012-02-11 MED ORDER — TRIAMTERENE-HCTZ 75-50 MG PO TABS: 1.0000 | ORAL_TABLET | Freq: Every day | ORAL | Status: AC

## 2012-02-11 MED ORDER — ZOLMITRIPTAN 5 MG PO TABS: 5.0000 mg | ORAL_TABLET | ORAL | Status: DC | PRN

## 2012-02-11 MED ORDER — POTASSIUM CHLORIDE CRYS ER 20 MEQ PO TBCR: 20.0000 meq | EXTENDED_RELEASE_TABLET | Freq: Every day | ORAL | Status: DC

## 2012-02-11 MED ORDER — ROSUVASTATIN CALCIUM 5 MG PO TABS: 5.0000 mg | ORAL_TABLET | ORAL | Status: DC

## 2012-02-11 MED ORDER — ZONISAMIDE 100 MG PO CAPS: 200.0000 mg | ORAL_CAPSULE | Freq: Every day | ORAL | Status: DC

## 2012-02-11 MED ORDER — ZONISAMIDE 25 MG PO CAPS: 75.0000 mg | ORAL_CAPSULE | Freq: Every day | ORAL | Status: DC

## 2012-02-11 NOTE — Progress Notes (Signed)
  Subjective:    Patient ID: Christine Daniels, female    DOB: 1946-08-05, 66 y.o.   MRN: [id number]  HPI Christine Daniels is a 66 year old married female nonsmoker postmenopausal G2 P2,,,,,,,,,, TAH and BSO,,,,,,,,, who comes in today as a new patient for general Medicare wellness examination  She has a history of allergic rhinitis for which she takes over-the-counter plain Claritin  She has a history of hypertension she takes Toprol 25 mg daily, one potassium tablet, Maxide 75-50 daily BP 130/90  She takes Crestor 5 mg daily for hyperlipidemia and an aspirin tablet  She has a history of a left carotid bruit she's followed cvts yearly. She does not recall what percent the blockage is.  She takes Zomig and zonogram for migraine headaches  She sees Dr. Dorice Lamas for a yearly checkup however she's had her uterus and ovaries removed therefore this is no longer indicated She gets routine eye care, regular dental care, BSE monthly, and you mammography, never had a colonoscopy recommended she do that, shingles 2013 tetanus booster today Cognitive function normal she walks on a regular basis and plays golf. Recently retired from work. Home health safety reviewed no issues identified, no guns in the house, she does have a health care power of attorney and living well  Review of Systems  Constitutional: Negative.   HENT: Negative.   Eyes: Negative.   Respiratory: Negative.   Cardiovascular: Negative.   Gastrointestinal: Negative.   Genitourinary: Negative.   Musculoskeletal: Negative.   Neurological: Negative.   Hematological: Negative.   Psychiatric/Behavioral: Negative.        Objective:   Physical Exam  Constitutional: She appears well-developed and well-nourished.  HENT:  Head: Normocephalic and atraumatic.  Right Ear: External ear normal.  Left Ear: External ear normal.  Nose: Nose normal.  Mouth/Throat: Oropharynx is clear and moist.  Eyes: EOM are normal. Pupils are equal, round, and  reactive to light.  Neck: Normal range of motion. Neck supple. No thyromegaly present.  Cardiovascular: Normal rate, regular rhythm, normal heart sounds and intact distal pulses.  Exam reveals no gallop and no friction rub.   No murmur heard. Pulmonary/Chest: Effort normal and breath sounds normal.  Abdominal: Soft. Bowel sounds are normal. She exhibits no distension and no mass. There is no tenderness. There is no rebound.  Musculoskeletal: Normal range of motion.  Lymphadenopathy:    She has no cervical adenopathy.  Neurological: She is alert. She has normal reflexes. No cranial nerve deficit. She exhibits normal muscle tone. Coordination normal.  Skin: Skin is warm and dry.       Total body skin exam normal skull her left elbow she had a squamous cell carcinoma removed this past winter,,,,,,,,,,, walks a lot and plays golf  Psychiatric: She has a normal mood and affect. Her behavior is normal. Judgment and thought content normal.          Assessment & Plan:  Healthy female  Allergic rhinitis continue plain Claritin  Hypertension continue Toprol potassium and Maxide  Hyperlipidemia continue Crestor and aspirin  Migraine headaches continue Zomig when necessary and zonogram daily  History of squamous cell carcinoma careful skin exam monthly for sun protection  Status post TAH and BSO pelvic exams no longer required

## 2012-02-11 NOTE — Patient Instructions (Signed)
Continue your current medications  Return in one year for general medical examination sooner if any problems  Consider colonoscopy

## 2012-03-04 ENCOUNTER — Other Ambulatory Visit (INDEPENDENT_AMBULATORY_CARE_PROVIDER_SITE_OTHER): Payer: Medicare Other

## 2012-03-04 DIAGNOSIS — E876 Hypokalemia: Secondary | ICD-10-CM

## 2012-03-04 LAB — BASIC METABOLIC PANEL
Chloride: 98 mEq/L (ref 96–112)
Potassium: 3.7 mEq/L (ref 3.5–5.1)
Sodium: 139 mEq/L (ref 135–145)

## 2012-03-11 ENCOUNTER — Other Ambulatory Visit: Payer: Self-pay | Admitting: Cardiology

## 2012-03-11 ENCOUNTER — Ambulatory Visit (INDEPENDENT_AMBULATORY_CARE_PROVIDER_SITE_OTHER): Payer: Medicare Other | Admitting: Pharmacist

## 2012-03-11 VITALS — Wt 157.0 lb

## 2012-03-11 DIAGNOSIS — E785 Hyperlipidemia, unspecified: Secondary | ICD-10-CM

## 2012-03-11 NOTE — Progress Notes (Signed)
HPI  Christine Daniels is a 66 yo F presenting to the clinic for dyslipidemia follow up.  For her cholesterol, she takes Fish Oil 1g daily, Niacin 300 mg daily, Crestor 5 mg Monday, Wednesday and Friday, and CoQ10 100 mg BID.  She does not report any problems with myalgias since decreasing Crestor from every other day to only MWF.  She does admit that she has problems remembering to take her Niacin every day and only takes it about 3 days per week.  She recently retired in March and has just switched to Dr. Tawanna Cooler for her primary care services.    She continues to make healthy dietary choices and has recently incorporated 2-3 apples or other fruits per day for an extra source of fiber.  Her breakfast usually consists of cereal or muffin.  Lunch is a light meal of soup or salad and a small dinner as well.  During her migraines, she stays with bland foods because the smells can make her headaches worse.  She does not like to snack or eat sweets.   Since patient has retired, she has been more active around her house.  She has started painting her house, walks the dog on a daily basis, and continues to play golf.   Current Outpatient Prescriptions  Medication Sig Dispense Refill  . aspirin 81 MG tablet Take 81 mg by mouth daily.        . Coenzyme Q10 (CO Q10) 100 MG TABS Take 1 tablet by mouth 2 (two) times daily.        . fish oil-omega-3 fatty acids 1000 MG capsule Take 1-2 g by mouth daily.       Marland Kitchen loratadine (CLARITIN) 10 MG tablet Take 10 mg by mouth daily.        . metoprolol succinate (TOPROL-XL) 25 MG 24 hr tablet Take 1 tablet (25 mg total) by mouth daily.  100 tablet  3  . niacin 100 MG tablet Take 300 mg by mouth daily.      . potassium chloride SA (K-DUR,KLOR-CON) 20 MEQ tablet Take 1 tablet (20 mEq total) by mouth daily.  30 tablet  3  . rosuvastatin (CRESTOR) 5 MG tablet Take 1 tablet (5 mg total) by mouth every other day.  90 tablet  3  . triamterene-hydrochlorothiazide (MAXZIDE) 75-50 MG per  tablet Take 1 tablet by mouth daily.  100 tablet  3  . zolmitriptan (ZOMIG) 5 MG tablet Take 1 tablet (5 mg total) by mouth as needed.  10 tablet  5  . zonisamide (ZONEGRAN) 100 MG capsule Take 2 capsules (200 mg total) by mouth daily.  200 capsule  3  . zonisamide (ZONEGRAN) 25 MG capsule Take 3 capsules (75 mg total) by mouth daily.  300 capsule  3   No Known Allergies

## 2012-03-11 NOTE — Patient Instructions (Addendum)
We will continue your current medications.    Try to be consistent in taking at least 1 tablet of Niacin a day.  We would prefer 3 tablets per day.  Continue to exercise as much as possible.   Recheck labs in 6 months.

## 2012-03-11 NOTE — Assessment & Plan Note (Signed)
Pt's cholesterol increased since last visit but she was not fasting when this blood work was drawn.  TC- 207 (goal<200), TG- 196 (goal<150), HDL- 49 (goal>45), and LDL- 139 (goal<130).  LFTs are WNL.  Since labs were not fasting, will not make any medication titrations at this time.  Encouraged compliance with Niacin on a daily basis to help decrease TG.  LDL increase may be result of decrease in Crestor dose.  Will follow up in 6 months.  If still above goal, may need to titrate Crestor to every other day again.

## 2012-08-06 ENCOUNTER — Other Ambulatory Visit: Payer: Self-pay | Admitting: *Deleted

## 2012-08-06 DIAGNOSIS — I6529 Occlusion and stenosis of unspecified carotid artery: Secondary | ICD-10-CM

## 2012-08-12 ENCOUNTER — Encounter: Payer: Self-pay | Admitting: Neurosurgery

## 2012-08-13 ENCOUNTER — Ambulatory Visit (INDEPENDENT_AMBULATORY_CARE_PROVIDER_SITE_OTHER): Payer: Medicare Other | Admitting: Neurosurgery

## 2012-08-13 ENCOUNTER — Encounter: Payer: Self-pay | Admitting: Neurosurgery

## 2012-08-13 ENCOUNTER — Other Ambulatory Visit (INDEPENDENT_AMBULATORY_CARE_PROVIDER_SITE_OTHER): Payer: Medicare Other | Admitting: *Deleted

## 2012-08-13 VITALS — BP 125/76 | HR 69 | Resp 14 | Ht 70.0 in | Wt 150.0 lb

## 2012-08-13 DIAGNOSIS — I6529 Occlusion and stenosis of unspecified carotid artery: Secondary | ICD-10-CM | POA: Insufficient documentation

## 2012-08-13 NOTE — Progress Notes (Signed)
[facility] Carotid Office Note  CC: Carotid surveillance Referring Physician: Edilia Bo  History of Present Illness: 66 year old female patient of Dr. Adele Dan with no history of carotid intervention. The patient denies any signs or symptoms of CVA, TIA, amaurosis fugax or any neural deficit. The patient denies any new medical diagnoses or recent surgery.  Past Medical History  Diagnosis Date  . Hypertension   . CEREBROVASCULAR DISEASE   . HYPERLIPIDEMIA     ROS: [x]  Positive   [ ]  Denies    General: [ ]  Weight loss, [ ]  Fever, [ ]  chills Neurologic: [ ]  Dizziness, [ ]  Blackouts, [ ]  Seizure [ ]  Stroke, [ ]  "Mini stroke", [ ]  Slurred speech, [ ]  Temporary blindness; [ ]  weakness in arms or legs, [ ]  Hoarseness Cardiac: [ ]  Chest pain/pressure, [ ]  Shortness of breath at rest [ ]  Shortness of breath with exertion, [ ]  Atrial fibrillation or irregular heartbeat Vascular: [ ]  Pain in legs with walking, [ ]  Pain in legs at rest, [ ]  Pain in legs at night,  [ ]  Non-healing ulcer, [ ]  Blood clot in vein/DVT,   Pulmonary: [ ]  Home oxygen, [ ]  Productive cough, [ ]  Coughing up blood, [ ]  Asthma,  [ ]  Wheezing Musculoskeletal:  [ ]  Arthritis, [ ]  Low back pain, [ ]  Joint pain Hematologic: [ ]  Easy Bruising, [ ]  Anemia; [ ]  Hepatitis Gastrointestinal: [ ]  Blood in stool, [ ]  Gastroesophageal Reflux/heartburn, [ ]  Trouble swallowing Urinary: [ ]  chronic Kidney disease, [ ]  on HD - [ ]  MWF or [ ]  TTHS, [ ]  Burning with urination, [ ]  Difficulty urinating Skin: [ ]  Rashes, [ ]  Wounds Psychological: [ ]  Anxiety, [ ]  Depression   Social History History  Substance Use Topics  . Smoking status: Former Smoker    Types: Cigarettes    Quit date: 09/10/1977  . Smokeless tobacco: Not on file  . Alcohol Use: 0.5 oz/week    1 drink(s) per week    Family History No family history on file.  No Known Allergies  Current Outpatient Prescriptions  Medication Sig  Dispense Refill  . aspirin 81 MG tablet Take 81 mg by mouth daily.        . Coenzyme Q10 (CO Q10) 100 MG TABS Take 1 tablet by mouth 2 (two) times daily.        . fish oil-omega-3 fatty acids 1000 MG capsule Take 1-2 g by mouth daily.       Marland Kitchen loratadine (CLARITIN) 10 MG tablet Take 10 mg by mouth daily.        . metoprolol succinate (TOPROL-XL) 25 MG 24 hr tablet Take 1 tablet (25 mg total) by mouth daily.  100 tablet  3  . metoprolol succinate (TOPROL-XL) 25 MG 24 hr tablet TAKE 1 TABLET BY MOUTH ONCE DAILY  90 tablet  1  . niacin 100 MG tablet Take 300 mg by mouth daily.      . potassium chloride SA (K-DUR,KLOR-CON) 20 MEQ tablet Take 1 tablet (20 mEq total) by mouth daily.  30 tablet  3  . rosuvastatin (CRESTOR) 5 MG tablet Take 1 tablet (5 mg total) by mouth every other day.  90 tablet  3  . triamterene-hydrochlorothiazide (MAXZIDE) 75-50 MG per tablet Take 1 tablet by mouth daily.  100 tablet  3  . zolmitriptan (ZOMIG) 5 MG tablet Take 1 tablet (5 mg total) by mouth as needed.  10 tablet  5  . zonisamide (ZONEGRAN) 100 MG capsule Take 2 capsules (200 mg total) by mouth daily.  200 capsule  3  . zonisamide (ZONEGRAN) 25 MG capsule Take 3 capsules (75 mg total) by mouth daily.  300 capsule  3    Physical Examination  Filed Vitals:   08/13/12 0943  BP: 125/76  Pulse: 69  Resp:     Body mass index is 21.52 kg/(m^2).  General:  WDWN in NAD Gait: Normal HEENT: WNL Eyes: Pupils equal Pulmonary: normal non-labored breathing , without Rales, rhonchi,  wheezing Cardiac: RRR, without  Murmurs, rubs or gallops; Abdomen: soft, NT, no masses Skin: no rashes, ulcers noted  Vascular Exam Pulses: 3+ radial pulses bilaterally Carotid bruits: Carotid pulses to auscultation no bruits are heard Extremities without ischemic changes, no Gangrene , no cellulitis; no open wounds;  Musculoskeletal: no muscle wasting or atrophy   Neurologic: A&O X 3; Appropriate Affect ; SENSATION: normal; MOTOR  FUNCTION:  moving all extremities equally. Speech is fluent/normal  Non-Invasive Vascular Imaging CAROTID DUPLEX 08/13/2012  Right ICA 0 - 19% stenosis Left ICA 0 - 19% stenosis   ASSESSMENT/PLAN: Asymptomatic patient with very mild bilateral carotid stenosis and any elevation in velocity is probably due to vessel tortuosity, the patient will followup in one year with repeat carotid duplex. The patient's questions were encouraged and answered, she is in agreement with this plan.  Lauree Chandler ANP   Clinic MD: Edilia Bo

## 2012-08-14 ENCOUNTER — Other Ambulatory Visit (INDEPENDENT_AMBULATORY_CARE_PROVIDER_SITE_OTHER): Payer: Medicare Other

## 2012-08-14 ENCOUNTER — Encounter: Payer: Self-pay | Admitting: *Deleted

## 2012-08-14 DIAGNOSIS — E7800 Pure hypercholesterolemia, unspecified: Secondary | ICD-10-CM

## 2012-08-14 LAB — LDL CHOLESTEROL, DIRECT: Direct LDL: 125.3 mg/dL

## 2012-08-14 LAB — LIPID PANEL
Cholesterol: 189 mg/dL (ref 0–200)
HDL: 40.5 mg/dL (ref >39.00)
Total CHOL/HDL Ratio: 5
VLDL: 42.6 mg/dL — ABNORMAL HIGH (ref 0.0–40.0)

## 2012-08-14 LAB — HEPATIC FUNCTION PANEL
Albumin: 4.2 g/dL (ref 3.5–5.2)
Alkaline Phosphatase: 82 U/L (ref 39–117)
Total Protein: 6.8 g/dL (ref 6.0–8.3)

## 2012-08-19 ENCOUNTER — Ambulatory Visit (INDEPENDENT_AMBULATORY_CARE_PROVIDER_SITE_OTHER): Payer: Medicare Other | Admitting: Pharmacist

## 2012-08-19 VITALS — Wt 154.0 lb

## 2012-08-19 DIAGNOSIS — E785 Hyperlipidemia, unspecified: Secondary | ICD-10-CM

## 2012-08-19 NOTE — Patient Instructions (Addendum)
Stop Niacin  Continue other medications.  Follow up with Dr. Jens Som and Dr. Tawanna Cooler in the future.

## 2012-08-19 NOTE — Progress Notes (Signed)
HPI  Mrs. Christine Daniels is a 66 yo F presenting to the clinic for dyslipidemia follow up.  For her cholesterol, she takes Fish Oil 1-2g daily, Niacin 300 mg daily, Crestor 5 mg Monday, Wednesday and Friday, and CoQ10 100 mg BID.  She does not report any problems with myalgias since decreasing Crestor from every other day to only MWF.  She does admit that she has problems remembering to take her Niacin every day and only takes it about 3 days per week.  She does also not a headache after she takes her Niacin.  She does not have this headache on days she skips her Niacin.  She recently retired in March and has just switched to Dr. Tawanna Cooler for her primary care services.    She previously followed a healthy diet with low fat food and fruits and vegetables, but recently has had painting and other remodeling done in her downstairs which has limited her cooking access.  This work is complete and her healthy eating lifestyle will restart.  During her migraines, she stays with bland foods because the smells can make her headaches worse.  She does not like to snack or eat sweets.   Since patient has retired, she has been more active around her house.  She has started painting her house, walks the dog on a daily basis, and continues to play golf. She is also considering a part time job next year.  Current Outpatient Prescriptions  Medication Sig Dispense Refill  . aspirin 81 MG tablet Take 81 mg by mouth daily.        . butalbital-aspirin-caffeine (FIORINAL) 50-325-40 MG per capsule Take 1 capsule by mouth as needed.      . Coenzyme Q10 (CO Q10) 100 MG TABS Take 1 tablet by mouth 2 (two) times daily.        . fish oil-omega-3 fatty acids 1000 MG capsule Take 1-2 g by mouth daily.       Marland Kitchen loratadine (CLARITIN) 10 MG tablet Take 10 mg by mouth daily.        . methocarbamol (ROBAXIN) 500 MG tablet Take 500 mg by mouth 4 (four) times daily.      . metoprolol succinate (TOPROL-XL) 25 MG 24 hr tablet Take 1 tablet (25 mg total)  by mouth daily.  100 tablet  3  . metoprolol succinate (TOPROL-XL) 25 MG 24 hr tablet TAKE 1 TABLET BY MOUTH ONCE DAILY  90 tablet  1  . potassium chloride SA (K-DUR,KLOR-CON) 20 MEQ tablet Take 1 tablet (20 mEq total) by mouth daily.  30 tablet  3  . rosuvastatin (CRESTOR) 5 MG tablet Take 1 tablet (5 mg total) by mouth every other day.  90 tablet  3  . triamterene-hydrochlorothiazide (MAXZIDE) 75-50 MG per tablet Take 1 tablet by mouth daily.  100 tablet  3  . zolmitriptan (ZOMIG) 5 MG tablet Take 1 tablet (5 mg total) by mouth as needed.  10 tablet  5  . zonisamide (ZONEGRAN) 100 MG capsule Take 300 mg by mouth at bedtime.      Marland Kitchen zonisamide (ZONEGRAN) 25 MG capsule Take 3 capsules (75 mg total) by mouth daily.  300 capsule  3   No Known Allergies

## 2012-08-19 NOTE — Assessment & Plan Note (Signed)
Pt's cholesterol improved slightly since last visit despite her lack of healthy diet.    TC- 189 (goal<200), TG- 213 slightly increased from prior (goal<150), HDL- 40.5 (goal>45), and LDL- 125  (goal<130).  LFTs are WNL.  Since labs are stable or slightly improved despite lack of healthy diet and exercise, will not make any medication titrations at this time.  However, due to her headaches with Niacin I have instructed her to stop this medication at this time. Her lipid panel has been stable and she usually is compliant with healthy diet and exercise.  She will be following up her lipid treatment with her primary MD, unless a problem arises.

## 2012-09-08 ENCOUNTER — Other Ambulatory Visit: Payer: Self-pay | Admitting: Cardiology

## 2012-10-25 ENCOUNTER — Other Ambulatory Visit: Payer: Self-pay

## 2012-12-04 ENCOUNTER — Other Ambulatory Visit: Payer: Self-pay | Admitting: Cardiology

## 2012-12-09 LAB — HM MAMMOGRAPHY

## 2013-01-23 ENCOUNTER — Encounter (INDEPENDENT_AMBULATORY_CARE_PROVIDER_SITE_OTHER): Payer: Medicare Other | Admitting: Ophthalmology

## 2013-01-23 DIAGNOSIS — H3530 Unspecified macular degeneration: Secondary | ICD-10-CM

## 2013-01-23 DIAGNOSIS — H2510 Age-related nuclear cataract, unspecified eye: Secondary | ICD-10-CM

## 2013-01-23 DIAGNOSIS — H43819 Vitreous degeneration, unspecified eye: Secondary | ICD-10-CM

## 2013-01-23 DIAGNOSIS — H35039 Hypertensive retinopathy, unspecified eye: Secondary | ICD-10-CM

## 2013-01-23 DIAGNOSIS — I10 Essential (primary) hypertension: Secondary | ICD-10-CM

## 2013-02-02 ENCOUNTER — Other Ambulatory Visit: Payer: Self-pay | Admitting: Cardiology

## 2013-03-12 ENCOUNTER — Other Ambulatory Visit: Payer: Self-pay | Admitting: Cardiology

## 2013-04-15 ENCOUNTER — Other Ambulatory Visit: Payer: Self-pay

## 2013-04-16 ENCOUNTER — Encounter: Payer: Self-pay | Admitting: Family Medicine

## 2013-06-04 ENCOUNTER — Encounter: Payer: Self-pay | Admitting: Family Medicine

## 2013-06-05 ENCOUNTER — Ambulatory Visit (INDEPENDENT_AMBULATORY_CARE_PROVIDER_SITE_OTHER): Payer: Medicare Other | Admitting: *Deleted

## 2013-06-05 ENCOUNTER — Encounter: Payer: Self-pay | Admitting: Family Medicine

## 2013-06-05 ENCOUNTER — Ambulatory Visit (INDEPENDENT_AMBULATORY_CARE_PROVIDER_SITE_OTHER): Payer: Medicare Other

## 2013-06-05 DIAGNOSIS — Z23 Encounter for immunization: Secondary | ICD-10-CM

## 2013-08-18 ENCOUNTER — Encounter: Payer: Self-pay | Admitting: Family

## 2013-08-19 ENCOUNTER — Ambulatory Visit (HOSPITAL_COMMUNITY)
Admission: RE | Admit: 2013-08-19 | Discharge: 2013-08-19 | Disposition: A | Discharge status: Home / Self Care | Payer: Medicare Other | Source: Ambulatory Visit | Attending: Family | Admitting: Family

## 2013-08-19 ENCOUNTER — Ambulatory Visit (INDEPENDENT_AMBULATORY_CARE_PROVIDER_SITE_OTHER): Payer: Medicare Other | Admitting: Family

## 2013-08-19 ENCOUNTER — Other Ambulatory Visit (HOSPITAL_COMMUNITY): Payer: Medicare Other

## 2013-08-19 ENCOUNTER — Encounter: Payer: Self-pay | Admitting: Family

## 2013-08-19 DIAGNOSIS — I6529 Occlusion and stenosis of unspecified carotid artery: Secondary | ICD-10-CM

## 2013-08-19 NOTE — Progress Notes (Signed)
Established Carotid Patient  History of Present Illness  Christine Daniels is a 67 y.o. female whom Dr. Edilia Bo have been following since 1999 with carotid disease. Her arteries were noted to be somewhat tortuous with possibility of some fibromuscular dysplasia. She was initially referred for carotid bruit auscultated by her PCP.  Patient has not had previous CEA nor carotid artery stent.  Patient has Negative history of TIA or stroke symptom.  The patient denies amaurosis fugax or monocular blindness.  The patient  denies facial drooping.  Pt. denies hemiplegia.  The patient denies receptive or expressive aphasia.  Pt. denies extremity weakness.   Patient denies New Medical or Surgical History.  Pt Diabetic: No Pt smoker: former smoker, quit 1970  Pt meds include: Statin : Yes Betablocker: Yes ASA: Yes Other anticoagulants/antiplatelets: no   Past Medical History  Diagnosis Date  . Hypertension   . CEREBROVASCULAR DISEASE   . HYPERLIPIDEMIA   . Carotid artery occlusion     Social History History  Substance Use Topics  . Smoking status: Former Smoker    Types: Cigarettes    Quit date: 09/10/1977  . Smokeless tobacco: Never Used  . Alcohol Use: 0.5 oz/week    1 drink(s) per week    Family History Family History  Problem Relation Age of Onset  . Cancer Mother   . Hyperlipidemia Father     Surgical History Past Surgical History  Procedure Laterality Date  . Status post bilateral tubal ligation.    . Abdominal hysterectomy  2010    No Known Allergies  Current Outpatient Prescriptions  Medication Sig Dispense Refill  . aspirin 81 MG tablet Take 81 mg by mouth daily.        . butalbital-aspirin-caffeine (FIORINAL) 50-325-40 MG per capsule Take 1 capsule by mouth as needed.      . Coenzyme Q10 (CO Q10) 100 MG TABS Take 1 tablet by mouth 2 (two) times daily.        . CRESTOR 5 MG tablet TAKE 1 TABLET BY MOUTH EVERY OTHER DAY.  90 tablet  0  . fish oil-omega-3  fatty acids 1000 MG capsule Take 1-2 g by mouth daily.       Marland Kitchen KLOR-CON M20 20 MEQ tablet TAKE 1 TABLET EVERY DAY  30 tablet  5  . loratadine (CLARITIN) 10 MG tablet Take 10 mg by mouth daily.        . methocarbamol (ROBAXIN) 500 MG tablet Take 500 mg by mouth 4 (four) times daily.      . metoprolol succinate (TOPROL-XL) 25 MG 24 hr tablet Take 1 tablet (25 mg total) by mouth daily.  100 tablet  3  . metoprolol succinate (TOPROL-XL) 25 MG 24 hr tablet TAKE 1 TABLET BY MOUTH ONCE DAILY  90 tablet  1  . potassium chloride SA (K-DUR,KLOR-CON) 20 MEQ tablet Take 1 tablet (20 mEq total) by mouth daily.  30 tablet  3  . rosuvastatin (CRESTOR) 5 MG tablet Take 1 tablet (5 mg total) by mouth every other day.  90 tablet  3  . triamterene-hydrochlorothiazide (MAXZIDE) 75-50 MG per tablet Take 1 tablet by mouth daily.  100 tablet  3  . zolmitriptan (ZOMIG) 5 MG tablet Take 1 tablet (5 mg total) by mouth as needed.  10 tablet  5  . zonisamide (ZONEGRAN) 100 MG capsule Take 300 mg by mouth at bedtime.      Marland Kitchen zonisamide (ZONEGRAN) 25 MG capsule Take 3 capsules (75 mg total) by  mouth daily.  300 capsule  3   No current facility-administered medications for this visit.    Physical Examination  Filed Vitals:   08/19/13 1126  BP: 125/76  Pulse: 71  Resp: 14   Filed Weights   08/19/13 1126  Weight: 144 lb (65.318 kg)   Body mass index is 20.66 kg/(m^2).  General: WDWN female in NAD GAIT: normal Eyes: PERRLA Pulmonary:  CTAB, Negative  Rales, Negative rhonchi, & Negative wheezing.  Cardiac: regular Rhythm ,  Negative Murmurs.  VASCULAR EXAM Carotid Bruits Left Right   Negative Negative     Radial pulses are 2+ palpable and equal.                                                                                                                            LE Pulses LEFT RIGHT       POPLITEAL  not palpable   not  palpable       POSTERIOR TIBIAL   palpable    palpable        DORSALIS PEDIS       ANTERIOR TIBIAL  palpable  palpable     Gastrointestinal: soft, nontender, BS WNL, no r/g,  negative masses.  Musculoskeletal: Negative muscle atrophy/wasting. M/S 5/5 throughout, Extremities without ischemic changes.  Neurologic: A&O X 3; Appropriate Affect ; SENSATION ;normal;  Speech is normal CN 2-12 intact, Pain and light touch intact in extremities, Motor exam as listed above.   Non-Invasive Vascular Imaging CAROTID DUPLEX 08/19/2013   Right ICA: 40 - 59 % stenosis. Left ICA: <40% stenosis.  These findings are Unchanged from previous exam.  Assessment: Christine Daniels is a 67 y.o. female who presents with asymptomatic 40 - 59 % Right ICA stenosis and minimal left ICA stenosis. Vessel tortuosity may be suggestive of fibromuscular dysplasia. The  ICA stenosis is  Unchanged from previous exam.  Plan: Follow-up in 1 years with Carotid Duplex scan.  Patient was given printed information about fibromuscular dysplasia.  I discussed in depth with the patient the nature of atherosclerosis, and emphasized the importance of maximal medical management including strict control of blood pressure, blood glucose, and lipid levels, obtaining regular exercise, and continued cessation of smoking.  The patient is aware that without maximal medical management the underlying atherosclerotic disease process will progress, limiting the benefit of any interventions. The patient was given information about stroke prevention and what symptoms should prompt the patient to seek immediate medical care. Thank you for allowing [place] to participate in this patient's care.  Charisse March, RN, MSN, FNP-C [facility] Office: [phone]  Clinic Physician: Edilia Bo  08/19/2013 11:04 AM

## 2013-08-19 NOTE — Patient Instructions (Signed)
Stroke Prevention Some medical conditions and behaviors are associated with an increased chance of having a stroke. You may prevent a stroke by making healthy choices and managing medical conditions. Reduce your risk of having a stroke by:  Staying physically active. Get at least 30 minutes of activity on most or all days.  Not smoking. It may also be helpful to avoid exposure to secondhand smoke.  Limiting alcohol use. Moderate alcohol use is considered to be:  No more than 2 drinks per day for men.  No more than 1 drink per day for nonpregnant women.  Eating healthy foods.  Include 5 or more servings of fruits and vegetables a day.  Certain diets may be prescribed to address high blood pressure, high cholesterol, diabetes, or obesity.  Managing your cholesterol levels.  A low-saturated fat, low-trans fat, low-cholesterol, and high-fiber diet may control cholesterol levels.  Take any prescribed medicines to control cholesterol as directed by your caregiver.  Managing your diabetes.  A controlled-carbohydrate, controlled-sugar diet is recommended to manage diabetes.  Take any prescribed medicines to control diabetes as directed by your caregiver.  Controlling your high blood pressure (hypertension).  A low-salt (sodium), low-saturated fat, low-trans fat, and low-cholesterol diet is recommended to manage high blood pressure.  Take any prescribed medicines to control hypertension as directed by your caregiver.  Maintaining a healthy weight.  A reduced-calorie, low-sodium, low-saturated fat, low-trans fat, low-cholesterol diet is recommended to manage weight.  Stopping drug abuse.  Avoiding birth control pills.  Talk to your caregiver about the risks of taking birth control pills if you are over 35 years old, smoke, get migraines, or have ever had a blood clot.  Getting evaluated for sleep disorders (sleep apnea).  Talk to your caregiver about getting a sleep evaluation  if you snore a lot or have excessive sleepiness.  Taking medicines as directed by your caregiver.  For some people, aspirin or blood thinners (anticoagulants) are helpful in reducing the risk of forming abnormal blood clots that can lead to stroke. If you have the irregular heart rhythm of atrial fibrillation, you should be on a blood thinner unless there is a good reason you cannot take them.  Understand all your medicine instructions. SEEK IMMEDIATE MEDICAL CARE IF:   You have sudden weakness or numbness of the face, arm, or leg, especially on one side of the body.  You have sudden confusion.  You have trouble speaking (aphasia) or understanding.  You have sudden trouble seeing in one or both eyes.  You have sudden trouble walking.  You have dizziness.  You have a loss of balance or coordination.  You have a sudden, severe headache with no known cause.  You have new chest pain or an irregular heartbeat. Any of these symptoms may represent a serious problem that is an emergency. Do not wait to see if the symptoms will go away. Get medical help right away. Call your local emergency services (911 in [place] Do not drive yourself to the hospital. Document Released: 10/04/2004 Document Revised: 11/19/2011 Document Reviewed: 02/27/2013 [employer] Patient Information 2014 [employer].  

## 2013-08-20 ENCOUNTER — Encounter: Payer: Self-pay | Admitting: *Deleted

## 2013-09-04 ENCOUNTER — Other Ambulatory Visit: Payer: Self-pay | Admitting: Cardiology

## 2013-12-08 ENCOUNTER — Other Ambulatory Visit: Payer: Self-pay

## 2013-12-08 MED ORDER — METOPROLOL SUCCINATE ER 25 MG PO TB24
ORAL_TABLET | ORAL | Status: DC
Start: 2013-12-08 — End: 2014-01-01

## 2013-12-29 ENCOUNTER — Other Ambulatory Visit: Payer: Self-pay | Admitting: Cardiology

## 2014-01-01 ENCOUNTER — Other Ambulatory Visit: Payer: Self-pay | Admitting: *Deleted

## 2014-01-01 MED ORDER — METOPROLOL SUCCINATE ER 25 MG PO TB24: ORAL_TABLET | ORAL | Status: DC

## 2014-01-26 ENCOUNTER — Encounter: Payer: Self-pay | Admitting: Nurse Practitioner

## 2014-01-26 ENCOUNTER — Ambulatory Visit (INDEPENDENT_AMBULATORY_CARE_PROVIDER_SITE_OTHER): Payer: Medicare Other | Admitting: Nurse Practitioner

## 2014-01-26 VITALS — BP 130/84 | HR 72 | Ht 70.0 in | Wt 143.8 lb

## 2014-01-26 DIAGNOSIS — I10 Essential (primary) hypertension: Secondary | ICD-10-CM

## 2014-01-26 DIAGNOSIS — E7800 Pure hypercholesterolemia, unspecified: Secondary | ICD-10-CM

## 2014-01-26 DIAGNOSIS — I999 Unspecified disorder of circulatory system: Secondary | ICD-10-CM

## 2014-01-26 MED ORDER — METOPROLOL SUCCINATE ER 25 MG PO TB24: ORAL_TABLET | ORAL | Status: DC

## 2014-01-26 NOTE — Progress Notes (Signed)
Christine Daniels Date of Birth: 03-03-1946 Medical Record #[id number]  History of Present Illness: Christine Daniels is seen back today for a follow up visit. Seen for Dr. Stanford Breed. She has a history of HLD, HTN and cerebrovascular disease. She has asymptomatic 40 - 59 % Right ICA stenosis and minimal left ICA stenosis with vessel tortuosity that may be suggestive of fibromuscular dysplasia - she is followed at [facility]. She is a remote smoker.  She has not been seen since March of 2013 - was doing ok at that time.  Comes back today. Here alone. Doing well with no concerns. No chest pain. Not short of breath. Has moved to the [facility] area to play more golf. Needs her metoprolol refilled. Lipids recently checked by her migraine doctor. She is happy with how she is doing - she would like to find primary care closer to her home.   Current Outpatient Prescriptions  Medication Sig Dispense Refill  . aspirin 81 MG tablet Take 81 mg by mouth daily.        . butalbital-aspirin-caffeine (FIORINAL) 50-325-40 MG per capsule Take 1 capsule by mouth as needed.      . Coenzyme Q10 (CO Q10) 100 MG TABS Take 1 tablet by mouth 2 (two) times daily.        Marland Kitchen loratadine-pseudoephedrine (CLARITIN-D 12-HOUR) 5-120 MG per tablet Take 1 tablet by mouth daily.      . metoprolol succinate (TOPROL-XL) 25 MG 24 hr tablet TAKE 1 TABLET EVERY DAY  14 tablet  0  . potassium chloride SA (K-DUR,KLOR-CON) 20 MEQ tablet Take 20 mEq by mouth every other day.      . rosuvastatin (CRESTOR) 5 MG tablet Take 1 tablet (5 mg total) by mouth every other day.  90 tablet  3  . triamterene-hydrochlorothiazide (MAXZIDE) 75-50 MG per tablet Take 1 tablet by mouth daily.  100 tablet  3  . zolmitriptan (ZOMIG) 5 MG tablet Take 1 tablet (5 mg total) by mouth as needed.  10 tablet  5  . zonisamide (ZONEGRAN) 25 MG capsule Take 3 capsules (75 mg total) by mouth daily.  300 capsule  3   No current facility-administered medications for this visit.     No Known Allergies  Past Medical History  Diagnosis Date  . Hypertension   . CEREBROVASCULAR DISEASE   . HYPERLIPIDEMIA   . Carotid artery occlusion     Past Surgical History  Procedure Laterality Date  . Status post bilateral tubal ligation.    . Abdominal hysterectomy  2010    History  Smoking status  . Former Smoker  . Types: Cigarettes  . Quit date: 09/10/1977  Smokeless tobacco  . Never Used    History  Alcohol Use  . 0.5 oz/week  . 1 drink(s) per week    Family History  Problem Relation Age of Onset  . Cancer Mother 43    Breast  . Hyperlipidemia Father   . Stroke Father 61    Review of Systems: The review of systems is per the HPI.  All other systems were reviewed and are negative.  Physical Exam: BP 130/84  Pulse 72  Ht 5\' 10"  (1.778 m)  Wt 143 lb 12.8 oz (65.227 kg)  BMI 20.63 kg/m2 Patient is very pleasant and in no acute distress. Skin is warm and dry. Color is normal.  HEENT is unremarkable. Normocephalic/atraumatic. PERRL. Sclera are nonicteric. Neck is supple. No masses. No JVD. Lungs are clear. Cardiac exam shows a regular  rate and rhythm. Abdomen is soft. Extremities are without edema. Gait and ROM are intact. No gross neurologic deficits noted.  Wt Readings from Last 3 Encounters:  01/26/14 143 lb 12.8 oz (65.227 kg)  08/19/13 144 lb (65.318 kg)  08/19/12 154 lb (69.854 kg)     LABORATORY DATA:   Lab Results  Component Value Date   WBC 7.8 02/11/2012   HGB 15.0 02/11/2012   HCT 44.7 02/11/2012   PLT 257.0 02/11/2012   GLUCOSE 90 03/04/2012   CHOL 189 08/14/2012   TRIG 213.0* 08/14/2012   HDL 40.50 08/14/2012   LDLDIRECT 125.3 08/14/2012   LDLCALC 121* 09/17/2011   ALT 19 08/14/2012   AST 28 08/14/2012   NA 139 03/04/2012   K 3.7 03/04/2012   CL 98 03/04/2012   CREATININE 0.8 03/04/2012   BUN 26* 03/04/2012   CO2 30 03/04/2012   TSH 1.79 02/11/2012   Non-Invasive Vascular Imaging  CAROTID DUPLEX 08/19/2013  Right ICA: 40 - 59 %  stenosis.  Left ICA: <40% stenosis.  These findings are Unchanged from previous exam.  Assessment:   Assessment / Plan:  1. HTN - BP is good. I have refilled her metoprolol today. See back in a year. Can defer this to primary care if needed.   2. HLD - on Crestor. Has had her lipids checked recently and she will try to have those sent our way for review.   3. Cerebrovascular disease - followed by [facility].  See back in a year. Try to get her to primary care closer to her home.  Patient is agreeable to this plan and will call if any problems develop in the interim.   Burtis Junes, RN, [place] [address] [place],   [phone]

## 2014-01-26 NOTE — Patient Instructions (Addendum)
Can we try to get her to primary care at [facility]?  I have refilled your Metoprolol today  Have your migraine doctor send [place] a copy of your lipids  See Dr. Stanford Breed in a year  Call the [place] office at [phone] if you have any questions, problems or concerns.

## 2014-01-27 ENCOUNTER — Ambulatory Visit (INDEPENDENT_AMBULATORY_CARE_PROVIDER_SITE_OTHER): Payer: Medicare Other | Admitting: Ophthalmology

## 2014-02-11 ENCOUNTER — Other Ambulatory Visit: Payer: Self-pay | Admitting: *Deleted

## 2014-02-11 MED ORDER — POTASSIUM CHLORIDE CRYS ER 20 MEQ PO TBCR: 20.0000 meq | EXTENDED_RELEASE_TABLET | ORAL | Status: DC

## 2014-03-04 ENCOUNTER — Ambulatory Visit (INDEPENDENT_AMBULATORY_CARE_PROVIDER_SITE_OTHER): Payer: Medicare Other | Admitting: Ophthalmology

## 2014-03-10 ENCOUNTER — Ambulatory Visit (INDEPENDENT_AMBULATORY_CARE_PROVIDER_SITE_OTHER): Payer: Medicare Other | Admitting: Ophthalmology

## 2014-03-18 ENCOUNTER — Telehealth: Payer: Self-pay | Admitting: *Deleted

## 2014-03-18 NOTE — Telephone Encounter (Signed)
Pt aware of lab results received paper copy from [facility]

## 2014-03-19 ENCOUNTER — Ambulatory Visit (INDEPENDENT_AMBULATORY_CARE_PROVIDER_SITE_OTHER): Payer: Medicare Other | Admitting: Ophthalmology

## 2014-03-19 DIAGNOSIS — I10 Essential (primary) hypertension: Secondary | ICD-10-CM

## 2014-03-19 DIAGNOSIS — H2510 Age-related nuclear cataract, unspecified eye: Secondary | ICD-10-CM

## 2014-03-19 DIAGNOSIS — H3530 Unspecified macular degeneration: Secondary | ICD-10-CM

## 2014-03-19 DIAGNOSIS — H43819 Vitreous degeneration, unspecified eye: Secondary | ICD-10-CM

## 2014-03-19 DIAGNOSIS — H35039 Hypertensive retinopathy, unspecified eye: Secondary | ICD-10-CM

## 2014-04-15 ENCOUNTER — Ambulatory Visit (INDEPENDENT_AMBULATORY_CARE_PROVIDER_SITE_OTHER): Payer: Medicare Other | Admitting: Family Medicine

## 2014-04-15 ENCOUNTER — Encounter: Payer: Self-pay | Admitting: Family Medicine

## 2014-04-15 VITALS — BP 128/80 | HR 77 | Temp 97.5°F | Ht 67.5 in | Wt 140.5 lb

## 2014-04-15 DIAGNOSIS — I10 Essential (primary) hypertension: Secondary | ICD-10-CM

## 2014-04-15 DIAGNOSIS — I679 Cerebrovascular disease, unspecified: Secondary | ICD-10-CM

## 2014-04-15 DIAGNOSIS — Z8669 Personal history of other diseases of the nervous system and sense organs: Secondary | ICD-10-CM

## 2014-04-15 DIAGNOSIS — I6529 Occlusion and stenosis of unspecified carotid artery: Secondary | ICD-10-CM

## 2014-04-15 DIAGNOSIS — E785 Hyperlipidemia, unspecified: Secondary | ICD-10-CM

## 2014-04-15 NOTE — Assessment & Plan Note (Signed)
Well controlled on current rx. Just had lipids checked through neuro in 09/2013.

## 2014-04-15 NOTE — Assessment & Plan Note (Signed)
Well controlled on current rx. No changes. 

## 2014-04-15 NOTE — Progress Notes (Signed)
Pre visit review using our clinic review tool, if applicable. No additional management support is needed unless otherwise documented below in the visit note. 

## 2014-04-15 NOTE — Assessment & Plan Note (Signed)
On statin and ASA. 

## 2014-04-15 NOTE — Progress Notes (Signed)
Subjective:   Patient ID: Christine Daniels, female    DOB: 02-24-1946, 68 y.o.   MRN: [id number]  [place] is a pleasant 68 y.o. year old female with h/o HLD, HTN, CVA, carotid stenosis, who presents to clinic today with Establish Care  on 04/15/2014  HPI:  Was being seen by Dr. Sherren Mocha.  Moved to [facility] and needed a PCP closer to home.  Avid golfer.  Last saw GYN, Dr. Stann Mainland in 12/2013- mammogram done then.  Dermatology is Dr. Rozann Lesches.  HTN- has been well controlled on current dose of Maxzide and Toprol XL. Followed by Dr. Stanford Breed.  Last saw Earley Favor, NP on 01/15/14- note reviewed. Metoprolol dose continued and refilled at that appt.  HLD- On crestor 5 mg daily.  Very active, has also lost weight. Wt Readings from Last 3 Encounters:  04/15/14 140 lb 8 oz (63.73 kg)  01/26/14 143 lb 12.8 oz (65.227 kg)  08/19/13 144 lb (65.318 kg)    Lab Results  Component Value Date   CHOL 189 08/14/2012   HDL 40.50 08/14/2012   LDLCALC 121* 09/17/2011   LDLDIRECT 125.3 08/14/2012   TRIG 213.0* 08/14/2012   CHOLHDL 5 08/14/2012   Lab Results  Component Value Date   ALT 19 08/14/2012   AST 28 08/14/2012   ALKPHOS 82 08/14/2012   BILITOT 0.7 08/14/2012   H/o right ICA stenosis- asymptomatic- followed by [facility]-tortuosity may be suggestive of fibromuscular dysplasia. Last saw Clemon Chambers, NP on 08/19/13- note reviewed. Follow up duplex scan in 1 year recommended.  Non-Invasive Vascular Imaging  CAROTID DUPLEX 08/19/2013  Right ICA: 40 - 59 % stenosis.  Left ICA: <40% stenosis.  These findings are Unchanged from previous exam.   Migraines- takes Zonegran 75  Mg daily with Zomig as needed for abortive therapy. Follow[facility] Domingo [facility].  Gets more migraines during thunderstorms- gets one every few months.  Not as severe when they used to be now that she is on zonegran. Current Outpatient Prescriptions on File Prior to Visit  Medication Sig Dispense  Refill  . aspirin 81 MG tablet Take 81 mg by mouth daily.        . butalbital-aspirin-caffeine (FIORINAL) 50-325-40 MG per capsule Take 1 capsule by mouth as needed.      . Coenzyme Q10 (CO Q10) 100 MG TABS Take 1 tablet by mouth 2 (two) times daily.        Marland Kitchen loratadine-pseudoephedrine (CLARITIN-D 12-HOUR) 5-120 MG per tablet Take 1 tablet by mouth daily.      . metoprolol succinate (TOPROL-XL) 25 MG 24 hr tablet TAKE 1 TABLET EVERY DAY  90 tablet  3  . potassium chloride SA (K-DUR,KLOR-CON) 20 MEQ tablet Take 1 tablet (20 mEq total) by mouth every other day.  15 tablet  5  . rosuvastatin (CRESTOR) 5 MG tablet Take 1 tablet (5 mg total) by mouth every other day.  90 tablet  3  . triamterene-hydrochlorothiazide (MAXZIDE) 75-50 MG per tablet Take 1 tablet by mouth daily.  100 tablet  3  . zolmitriptan (ZOMIG) 5 MG tablet Take 1 tablet (5 mg total) by mouth as needed.  10 tablet  5  . zonisamide (ZONEGRAN) 25 MG capsule Take 3 capsules (75 mg total) by mouth daily.  300 capsule  3   No current facility-administered medications on file prior to visit.    No Known Allergies  Past Medical History  Diagnosis Date  . Hypertension   .  CEREBROVASCULAR DISEASE   . HYPERLIPIDEMIA   . Carotid artery occlusion   . Arthritis   . Migraine     Past Surgical History  Procedure Laterality Date  . Status post bilateral tubal ligation.    . Abdominal hysterectomy  2010  . Breast biopsy  1987    Family History  Problem Relation Age of Onset  . Cancer Mother 90    Breast  . Hyperlipidemia Father   . Stroke Father 61    History   Social History  . Marital Status: Married    Spouse Name: N/A    Number of Children: N/A  . Years of Education: N/A   Occupational History  . Not on file.   Social History Main Topics  . Smoking status: Former Smoker    Types: Cigarettes    Quit date: 09/10/1977  . Smokeless tobacco: Never Used  . Alcohol Use: 0.5 oz/week    1 drink(s) per week  . Drug  Use: No  . Sexual Activity: Yes   Other Topics Concern  . Not on file   Social History Narrative   She has remote history of minimal tobacco use, but has     not smoked in years.  She rarely consumes alcohol.    The PMH, PSH, Social History, Family History, Medications, and allergies have been reviewed in [facility], and have been updated if relevant.    Review of Systems    See HPI No changes in bowel habits Very active- no CP or SOB with exertion No anxiety or depression- happy with her recent move, son just got married + HA but not as frequent No nausea or vomiting No LE edema No rashes +skin lesions- just had BCC of nose removed, s/p Mohs Objective:    BP 128/80  Pulse 77  Temp(Src) 97.5 F (36.4 C) (Oral)  Ht 5' 7.5" (1.715 m)  Wt 140 lb 8 oz (63.73 kg)  BMI 21.67 kg/m2  SpO2 99%   Physical Exam  Constitutional: She appears well-developed and well-nourished.  HENT:  Head: Normocephalic and atraumatic.  Right Ear: External ear normal.  Left Ear: External ear normal.  Nose: Nose normal.  Mouth/Throat: Oropharynx is clear and moist.  Eyes: EOM are normal. Pupils are equal, round, and reactive to light.  Neck: Normal range of motion. Neck supple. No thyromegaly present.  Cardiovascular: Normal rate, regular rhythm, normal heart sounds and intact distal pulses. Exam reveals no gallop and no friction rub.  No murmur heard.  Pulmonary/Chest: Effort normal and breath sounds normal.  Abdominal: Soft. Bowel sounds are normal. She exhibits no distension and no mass. There is no tenderness. There is no rebound.  Musculoskeletal: Normal range of motion.  Lymphadenopathy:  She has no cervical adenopathy.  Neurological: She is alert. She has normal reflexes. No cranial nerve deficit. She exhibits normal muscle tone. Coordination normal.  Skin: Skin is warm and dry.  Psychiatric: She has a normal mood and affect. Her behavior is normal. Judgment and thought content normal.         Assessment & Plan:   Occlusion and stenosis of carotid artery without mention of cerebral infarction  CEREBROVASCULAR DISEASE  HYPERTENSION  HYPERLIPIDEMIA  History of migraine headaches No Follow-up on file.

## 2014-04-15 NOTE — Assessment & Plan Note (Signed)
Unchanged.  Scheduled for follow up doppler in 08/2014.

## 2014-04-15 NOTE — Patient Instructions (Signed)
Great to meet you. Please return sometime after 9.26.2015 to get your pneumonia booster and flu shot (we should have the flu shots sometime this month).

## 2014-04-16 ENCOUNTER — Telehealth: Payer: Self-pay | Admitting: Family Medicine

## 2014-04-16 NOTE — Telephone Encounter (Signed)
Relevant patient education assigned to patient using Emmi. ° °

## 2014-06-04 ENCOUNTER — Telehealth: Payer: Self-pay

## 2014-06-04 NOTE — Telephone Encounter (Signed)
Pt wanted to know date could return for pneumonia and flu vaccine. Pt advised after 06/05/14. Pt will cb to schedule.

## 2014-06-21 ENCOUNTER — Ambulatory Visit (INDEPENDENT_AMBULATORY_CARE_PROVIDER_SITE_OTHER): Payer: Medicare Other

## 2014-06-21 DIAGNOSIS — Z23 Encounter for immunization: Secondary | ICD-10-CM

## 2014-07-08 ENCOUNTER — Ambulatory Visit (INDEPENDENT_AMBULATORY_CARE_PROVIDER_SITE_OTHER): Payer: Medicare Other | Admitting: Family Medicine

## 2014-07-08 ENCOUNTER — Encounter: Payer: Self-pay | Admitting: Family Medicine

## 2014-07-08 VITALS — BP 134/87 | HR 79 | Temp 98.6°F | Ht 67.5 in | Wt 143.5 lb

## 2014-07-08 DIAGNOSIS — J069 Acute upper respiratory infection, unspecified: Secondary | ICD-10-CM

## 2014-07-08 NOTE — Progress Notes (Signed)
Pre visit review using our clinic review tool, if applicable. No additional management support is needed unless otherwise documented below in the visit note. 

## 2014-07-08 NOTE — Progress Notes (Signed)
Dr. Frederico Hamman T. Tyria Springer, MD, [facility] [place], [address] Phone: [phone] Fax: [phone]  07/08/2014  Patient: Christine Daniels, MRN: [id number], DOB: 1945-11-30, 68 y.o.  Primary Physician:  Arnette Norris, MD  Chief Complaint: Cough, Sore Throat, Headache and Fever  Subjective:   This 68 y.o. female patient presents with runny nose, sneezing, cough, sore throat, malaise and minimal / low-grade fever .  Woke up last Saturday, with bad sore throat, and then went into uri symptoms, and never really had any fever. Takes some robitussin for a couple of nights.  + recent exposure to others with similar symptoms.   The patent denies sore throat as the primary complaint. Denies sthortness of breath/wheezing, high fever, chest pain, rhinits for more than 14 days, significant myalgia, otalgia, facial pain, abdominal pain, changes in bowel or bladder.  PMH, PHS, Allergies, Problem List, Medications, Family History, and Social History have all been reviewed.  ROS as above, eating and drinking - tolerating PO. Urinating normally. No excessive vomitting or diarrhea. O/w as above.  Objective:   Blood pressure 134/87, pulse 79, temperature 98.6 F (37 C), temperature source Oral, height 5' 7.5" (1.715 m), weight 143 lb 8 oz (65.091 kg).  GEN: WDWN, Non-toxic, Atraumatic, normocephalic. A and O x 3. HEENT: Oropharynx clear without exudate, MMM, no significant LAD, mild rhinnorhea Ears: TM clear, COL visualized with good landmarks CV: RRR, no m/g/r. Pulm: CTA B, no wheezes, rhonchi, or crackles, normal respiratory effort. EXT: no c/c/e Psych: well oriented, neither depressed nor anxious in appearance  Objective Data: Results for orders placed in visit on 08/14/12  LIPID PANEL      Result Value Ref Range   Cholesterol 189  0 - 200 mg/dL   Triglycerides 213.0 (*) 0.0 - 149.0 mg/dL   HDL 40.50  >39.00 mg/dL   VLDL 42.6 (*)  0.0 - 40.0 mg/dL   Total CHOL/HDL Ratio 5    HEPATIC FUNCTION PANEL      Result Value Ref Range   Total Bilirubin 0.7  0.3 - 1.2 mg/dL   Bilirubin, Direct 0.2  0.0 - 0.3 mg/dL   Alkaline Phosphatase 82  39 - 117 U/L   AST 28  0 - 37 U/L   ALT 19  0 - 35 U/L   Total Protein 6.8  6.0 - 8.3 g/dL   Albumin 4.2  3.5 - 5.2 g/dL  LDL CHOLESTEROL, DIRECT      Result Value Ref Range   Direct LDL 125.3      Assessment and Plan:   URI (upper respiratory infection)  Supportive care reviewed with patient. See patient instruction section.   Signed,  Maud Deed. Camyla Camposano, MD   Patient's Medications  New Prescriptions   No medications on file  Previous Medications   ASPIRIN 81 MG TABLET    Take 81 mg by mouth daily.     BUTALBITAL-ASPIRIN-CAFFEINE (FIORINAL) 50-325-40 MG PER CAPSULE    Take 1 capsule by mouth as needed.   COENZYME Q10 (CO Q10) 100 MG TABS    Take 1 tablet by mouth 2 (two) times daily.     LORATADINE-PSEUDOEPHEDRINE (CLARITIN-D 12-HOUR) 5-120 MG PER TABLET    Take 1 tablet by mouth daily.   METOPROLOL SUCCINATE (TOPROL-XL) 25 MG 24 HR TABLET    TAKE 1 TABLET EVERY DAY   POTASSIUM CHLORIDE SA (K-DUR,KLOR-CON) 20 MEQ TABLET    Take 1 tablet (20 mEq total)  by mouth every other day.   ROSUVASTATIN (CRESTOR) 5 MG TABLET    Take 1 tablet (5 mg total) by mouth every other day.   TRIAMTERENE-HYDROCHLOROTHIAZIDE (MAXZIDE) 75-50 MG PER TABLET    Take 1 tablet by mouth daily.   ZOLMITRIPTAN (ZOMIG) 5 MG TABLET    Take 1 tablet (5 mg total) by mouth as needed.   ZONISAMIDE (ZONEGRAN) 25 MG CAPSULE    Take 3 capsules (75 mg total) by mouth daily.  Modified Medications   No medications on file  Discontinued Medications   No medications on file

## 2014-07-27 ENCOUNTER — Ambulatory Visit (INDEPENDENT_AMBULATORY_CARE_PROVIDER_SITE_OTHER): Payer: Medicare Other

## 2014-07-27 DIAGNOSIS — Z23 Encounter for immunization: Secondary | ICD-10-CM

## 2014-08-10 ENCOUNTER — Other Ambulatory Visit: Payer: Self-pay

## 2014-08-10 MED ORDER — POTASSIUM CHLORIDE CRYS ER 20 MEQ PO TBCR: 20.0000 meq | EXTENDED_RELEASE_TABLET | ORAL | Status: DC

## 2014-08-24 ENCOUNTER — Encounter: Payer: Self-pay | Admitting: Family

## 2014-08-25 ENCOUNTER — Ambulatory Visit (HOSPITAL_COMMUNITY)
Admission: RE | Admit: 2014-08-25 | Discharge: 2014-08-25 | Disposition: A | Discharge status: Home / Self Care | Payer: Medicare Other | Source: Ambulatory Visit | Attending: Family | Admitting: Family

## 2014-08-25 ENCOUNTER — Ambulatory Visit (INDEPENDENT_AMBULATORY_CARE_PROVIDER_SITE_OTHER): Payer: Medicare Other | Admitting: Family

## 2014-08-25 ENCOUNTER — Encounter: Payer: Self-pay | Admitting: Family

## 2014-08-25 VITALS — BP 126/75 | HR 68 | Resp 16 | Ht 70.0 in | Wt 144.0 lb

## 2014-08-25 DIAGNOSIS — I773 Arterial fibromuscular dysplasia: Secondary | ICD-10-CM | POA: Insufficient documentation

## 2014-08-25 DIAGNOSIS — E785 Hyperlipidemia, unspecified: Secondary | ICD-10-CM | POA: Diagnosis not present

## 2014-08-25 DIAGNOSIS — Z7982 Long term (current) use of aspirin: Secondary | ICD-10-CM | POA: Diagnosis not present

## 2014-08-25 DIAGNOSIS — I6523 Occlusion and stenosis of bilateral carotid arteries: Secondary | ICD-10-CM

## 2014-08-25 DIAGNOSIS — Z87891 Personal history of nicotine dependence: Secondary | ICD-10-CM | POA: Insufficient documentation

## 2014-08-25 DIAGNOSIS — I679 Cerebrovascular disease, unspecified: Secondary | ICD-10-CM | POA: Insufficient documentation

## 2014-08-25 DIAGNOSIS — Z79899 Other long term (current) drug therapy: Secondary | ICD-10-CM | POA: Insufficient documentation

## 2014-08-25 NOTE — Progress Notes (Signed)
Established Carotid Patient   History of Present Illness  Christine Daniels is a 68 y.o. female whom Dr. Scot Dock have been following since 1999 with carotid disease. Her arteries were noted to be somewhat tortuous with possibility of some fibromuscular dysplasia. She was initially referred for carotid bruit auscultated by her PCP.  Patient has not had previous CEA nor carotid artery stent.  The patient denies any history of TIA or stroke symptoms, specifically the patient denies a history of amaurosis fugax or monocular blindness, denies a history unilateral  of facial drooping, denies a history of hemiplegia, and denies a history of receptive or expressive aphasia.  Patient denies New Medical or Surgical History.  Pt Diabetic: No Pt smoker: former smoker, quit 1979  Pt meds include: Statin : Yes Betablocker: Yes ASA: Yes Other anticoagulants/antiplatelets: no    Past Medical History  Diagnosis Date  . Hypertension   . CEREBROVASCULAR DISEASE   . HYPERLIPIDEMIA   . Carotid artery occlusion   . Arthritis   . Migraine     Social History History  Substance Use Topics  . Smoking status: Former Smoker    Types: Cigarettes    Quit date: 09/10/1977  . Smokeless tobacco: Never Used  . Alcohol Use: 0.5 oz/week    1 drink(s) per week    Family History Family History  Problem Relation Age of Onset  . Cancer Mother 94    Breast  . Hyperlipidemia Father   . Stroke Father 65    Surgical History Past Surgical History  Procedure Laterality Date  . Status post bilateral tubal ligation.    . Abdominal hysterectomy  2010  . Breast biopsy  1987    No Known Allergies  Current Outpatient Prescriptions  Medication Sig Dispense Refill  . aspirin 81 MG tablet Take 81 mg by mouth daily.      . butalbital-aspirin-caffeine (FIORINAL) 50-325-40 MG per capsule Take 1 capsule by mouth as needed.    . Coenzyme Q10 (CO Q10) 100 MG TABS Take 1 tablet by mouth 2 (two) times daily.       Marland Kitchen loratadine-pseudoephedrine (CLARITIN-D 12-HOUR) 5-120 MG per tablet Take 1 tablet by mouth daily.    . metoprolol succinate (TOPROL-XL) 25 MG 24 hr tablet TAKE 1 TABLET EVERY DAY 90 tablet 3  . potassium chloride SA (K-DUR,KLOR-CON) 20 MEQ tablet Take 1 tablet (20 mEq total) by mouth every other day. 15 tablet 5  . rosuvastatin (CRESTOR) 5 MG tablet Take 1 tablet (5 mg total) by mouth every other day. 90 tablet 3  . triamterene-hydrochlorothiazide (MAXZIDE) 75-50 MG per tablet Take 1 tablet by mouth daily. 100 tablet 3  . zolmitriptan (ZOMIG) 5 MG tablet Take 1 tablet (5 mg total) by mouth as needed. 10 tablet 5  . zonisamide (ZONEGRAN) 25 MG capsule Take 3 capsules (75 mg total) by mouth daily. 300 capsule 3   No current facility-administered medications for this visit.    Review of Systems : See HPI for pertinent positives and negatives.  Physical Examination  Filed Vitals:   08/25/14 0957 08/25/14 1000  BP: 120/72 126/75  Pulse: 70 68  Resp:  16  Height:  5\' 10"  (1.778 m)  Weight:  144 lb (65.318 kg)  SpO2:  100%   Body mass index is 20.66 kg/(m^2).  General: WDWN female in NAD GAIT: normal Eyes: PERRLA Pulmonary: CTAB, Negative Rales, Negative rhonchi, & Negative wheezing.  Cardiac: regular Rhythm, no detected murmur.  VASCULAR EXAM Carotid Bruits Left  Right   Negative Negative    Radial pulses are 2+ palpable and equal.      LE Pulses LEFT RIGHT   POPLITEAL not palpable  not palpable   POSTERIOR TIBIAL  palpable   palpable    DORSALIS PEDIS  ANTERIOR TIBIAL palpable  palpable     Gastrointestinal: soft, nontender, BS WNL, no r/g,no palpable masses.  Musculoskeletal: Negative muscle atrophy/wasting. M/S 5/5 throughout, Extremities without ischemic changes.  Neurologic: A&O X 3;  Appropriate Affect, Speech is normal CN 2-12 intact, Pain and light touch intact in extremities, Motor exam as listed above.    Non-Invasive Vascular Imaging CAROTID DUPLEX 08/25/2014   CEREBROVASCULAR DUPLEX EVALUATION    INDICATION: Carotid stenosis    PREVIOUS INTERVENTION(S): NA    DUPLEX EXAM:     RIGHT  LEFT  Peak Systolic Velocities (cm/s) End Diastolic Velocities (cm/s) Plaque LOCATION Peak Systolic Velocities (cm/s) End Diastolic Velocities (cm/s) Plaque  94 14  CCA PROXIMAL 92 17   84 18  CCA MID 71 21   75 21 HT CCA DISTAL 63 18 HT  141 30 HT ECA 58 14 HT  70 28 HT ICA PROXIMAL 70 28 HT  157 49  ICA MID 86 28   83 17  ICA DISTAL 108 32     1.87 ICA / CCA Ratio (PSV) 0.99  Antegrade Vertebral Flow Antegrade  578 Brachial Systolic Pressure (mmHg) 469  Triphasic Brachial Artery Waveforms Triphasic    Plaque Morphology:  HM = Homogeneous, HT = Heterogeneous, CP = Calcific Plaque, SP = Smooth Plaque, IP = Irregular Plaque     ADDITIONAL FINDINGS:     IMPRESSION: Bilateral common carotid artery disease of less than 50%. Right internal carotid artery stenosis in the 40%-59% range versus tortuousity at the mid vessel segment. Left internal carotid artery stenosis present of less than 40%.    Compared to the previous exam:  Unchanged since previous study on 08/19/2013.      Assessment: Christine Daniels is a 68 y.o. female who presents with asymptomatic right internal carotid artery stenosis in the 40%-59% range versus tortuousity at the mid vessel segment, left ICA stenosis  less than 40%, and bilateral common carotid artery disease of less than 50%, unchanged since previous study on 08/19/2013. Her blood pressure has remained well in control.   Plan: Follow-up in 1 year with Carotid Duplex.   I discussed in depth with the patient the nature of atherosclerosis, and emphasized the importance of maximal medical management including strict control of blood  pressure, blood glucose, and lipid levels, obtaining regular exercise, and continued cessation of smoking.  The patient is aware that without maximal medical management the underlying atherosclerotic disease process will progress, limiting the benefit of any interventions. The patient was given information about stroke prevention and what symptoms should prompt the patient to seek immediate medical care. Thank you for allowing [place] to participate in this patient's care.  Clemon Chambers, RN, MSN, FNP-C [facility] Office: [phone]  Clinic Physician: Scot Dock  08/25/2014 9:29 AM

## 2014-08-25 NOTE — Patient Instructions (Signed)
Stroke Prevention Some medical conditions and behaviors are associated with an increased chance of having a stroke. You may prevent a stroke by making healthy choices and managing medical conditions. HOW CAN I REDUCE MY RISK OF HAVING A STROKE?   Stay physically active. Get at least 30 minutes of activity on most or all days.  Do not smoke. It may also be helpful to avoid exposure to secondhand smoke.  Limit alcohol use. Moderate alcohol use is considered to be:  No more than 2 drinks per day for men.  No more than 1 drink per day for nonpregnant women.  Eat healthy foods. This involves:  Eating 5 or more servings of fruits and vegetables a day.  Making dietary changes that address high blood pressure (hypertension), high cholesterol, diabetes, or obesity.  Manage your cholesterol levels.  Making food choices that are high in fiber and low in saturated fat, trans fat, and cholesterol may control cholesterol levels.  Take any prescribed medicines to control cholesterol as directed by your health care provider.  Manage your diabetes.  Controlling your carbohydrate and sugar intake is recommended to manage diabetes.  Take any prescribed medicines to control diabetes as directed by your health care provider.  Control your hypertension.  Making food choices that are low in salt (sodium), saturated fat, trans fat, and cholesterol is recommended to manage hypertension.  Take any prescribed medicines to control hypertension as directed by your health care provider.  Maintain a healthy weight.  Reducing calorie intake and making food choices that are low in sodium, saturated fat, trans fat, and cholesterol are recommended to manage weight.  Stop drug abuse.  Avoid taking birth control pills.  Talk to your health care provider about the risks of taking birth control pills if you are over 35 years old, smoke, get migraines, or have ever had a blood clot.  Get evaluated for sleep  disorders (sleep apnea).  Talk to your health care provider about getting a sleep evaluation if you snore a lot or have excessive sleepiness.  Take medicines only as directed by your health care provider.  For some people, aspirin or blood thinners (anticoagulants) are helpful in reducing the risk of forming abnormal blood clots that can lead to stroke. If you have the irregular heart rhythm of atrial fibrillation, you should be on a blood thinner unless there is a good reason you cannot take them.  Understand all your medicine instructions.  Make sure that other conditions (such as anemia or atherosclerosis) are addressed. SEEK IMMEDIATE MEDICAL CARE IF:   You have sudden weakness or numbness of the face, arm, or leg, especially on one side of the body.  Your face or eyelid droops to one side.  You have sudden confusion.  You have trouble speaking (aphasia) or understanding.  You have sudden trouble seeing in one or both eyes.  You have sudden trouble walking.  You have dizziness.  You have a loss of balance or coordination.  You have a sudden, severe headache with no known cause.  You have new chest pain or an irregular heartbeat. Any of these symptoms may represent a serious problem that is an emergency. Do not wait to see if the symptoms will go away. Get medical help at once. Call your local emergency services (911 in [place] Do not drive yourself to the hospital. Document Released: 10/04/2004 Document Revised: 01/11/2014 Document Reviewed: 02/27/2013 [employer] Patient Information 2015 [employer]. This information is not intended to replace advice given   to you by your health care provider. Make sure you discuss any questions you have with your health care provider.  

## 2014-08-25 NOTE — Addendum Note (Signed)
Addended by: Mena Goes on: 08/25/2014 05:23 PM   Modules accepted: Orders

## 2014-09-14 DIAGNOSIS — G43719 Chronic migraine without aura, intractable, without status migrainosus: Secondary | ICD-10-CM | POA: Diagnosis not present

## 2014-09-14 DIAGNOSIS — Z79899 Other long term (current) drug therapy: Secondary | ICD-10-CM | POA: Diagnosis not present

## 2014-09-14 DIAGNOSIS — G43019 Migraine without aura, intractable, without status migrainosus: Secondary | ICD-10-CM | POA: Diagnosis not present

## 2014-10-12 ENCOUNTER — Other Ambulatory Visit: Payer: Self-pay

## 2014-10-12 ENCOUNTER — Other Ambulatory Visit: Payer: Self-pay | Admitting: *Deleted

## 2014-10-12 DIAGNOSIS — E785 Hyperlipidemia, unspecified: Secondary | ICD-10-CM

## 2014-10-12 MED ORDER — ROSUVASTATIN CALCIUM 5 MG PO TABS: 5.0000 mg | ORAL_TABLET | ORAL | Status: DC

## 2014-10-12 NOTE — Telephone Encounter (Signed)
Needs further investigation, turned over to [facility] in refills.Marland KitchenMarland Kitchen

## 2014-10-12 NOTE — Telephone Encounter (Signed)
Opened in error

## 2014-10-14 ENCOUNTER — Other Ambulatory Visit: Payer: Self-pay | Admitting: *Deleted

## 2014-10-14 ENCOUNTER — Telehealth: Payer: Self-pay | Admitting: *Deleted

## 2014-10-14 DIAGNOSIS — E785 Hyperlipidemia, unspecified: Secondary | ICD-10-CM

## 2014-10-14 MED ORDER — ROSUVASTATIN CALCIUM 5 MG PO TABS: 5.0000 mg | ORAL_TABLET | ORAL | Status: DC

## 2014-11-03 DIAGNOSIS — Z1283 Encounter for screening for malignant neoplasm of skin: Secondary | ICD-10-CM | POA: Diagnosis not present

## 2014-11-03 DIAGNOSIS — X32XXXA Exposure to sunlight, initial encounter: Secondary | ICD-10-CM | POA: Diagnosis not present

## 2014-11-03 DIAGNOSIS — L820 Inflamed seborrheic keratosis: Secondary | ICD-10-CM | POA: Diagnosis not present

## 2014-11-03 DIAGNOSIS — L570 Actinic keratosis: Secondary | ICD-10-CM | POA: Diagnosis not present

## 2014-12-16 ENCOUNTER — Other Ambulatory Visit: Payer: Self-pay

## 2014-12-16 MED ORDER — POTASSIUM CHLORIDE CRYS ER 20 MEQ PO TBCR: 20.0000 meq | EXTENDED_RELEASE_TABLET | ORAL | Status: DC

## 2015-01-14 ENCOUNTER — Other Ambulatory Visit: Payer: Self-pay | Admitting: Nurse Practitioner

## 2015-01-25 DIAGNOSIS — Z01419 Encounter for gynecological examination (general) (routine) without abnormal findings: Secondary | ICD-10-CM | POA: Diagnosis not present

## 2015-01-25 DIAGNOSIS — Z1231 Encounter for screening mammogram for malignant neoplasm of breast: Secondary | ICD-10-CM | POA: Diagnosis not present

## 2015-01-25 LAB — HM MAMMOGRAPHY

## 2015-02-04 DIAGNOSIS — Z1211 Encounter for screening for malignant neoplasm of colon: Secondary | ICD-10-CM | POA: Diagnosis not present

## 2015-02-07 NOTE — Progress Notes (Signed)
      HPI: FU hypertension; also with cerebrovascular disease followed by vascular surgery. Since last seen, the patient denies any dyspnea on exertion, orthopnea, PND, pedal edema, palpitations, syncope or chest pain.   Current Outpatient Prescriptions  Medication Sig Dispense Refill  . aspirin 81 MG tablet Take 81 mg by mouth daily.      . butalbital-aspirin-caffeine (FIORINAL) 50-325-40 MG per capsule Take 1 capsule by mouth as needed.    . Coenzyme Q10 (CO Q10) 100 MG TABS Take 1 tablet by mouth every other day.     . loratadine-pseudoephedrine (CLARITIN-D 12-HOUR) 5-120 MG per tablet Take 1 tablet by mouth daily.    . metoprolol succinate (TOPROL-XL) 25 MG 24 hr tablet TAKE 1 TABLET EVERY DAY 90 tablet 0  . potassium chloride SA (K-DUR,KLOR-CON) 20 MEQ tablet Take 1 tablet (20 mEq total) by mouth every other day. 15 tablet 0  . rosuvastatin (CRESTOR) 5 MG tablet Take 1 tablet (5 mg total) by mouth every other day. 90 tablet 1  . triamterene-hydrochlorothiazide (MAXZIDE) 75-50 MG per tablet Take 1 tablet by mouth daily. 100 tablet 3  . zolmitriptan (ZOMIG) 5 MG tablet Take 1 tablet (5 mg total) by mouth as needed. (Patient taking differently: Take 5 mg by mouth as needed for migraine. ) 10 tablet 5  . zonisamide (ZONEGRAN) 25 MG capsule Take 3 capsules (75 mg total) by mouth daily. (Patient taking differently: Take 75 mg by mouth daily. Migraine) 300 capsule 3   No current facility-administered medications for this visit.     Past Medical History  Diagnosis Date  . Hypertension   . CEREBROVASCULAR DISEASE   . HYPERLIPIDEMIA   . Carotid artery occlusion   . Arthritis   . Migraine     Past Surgical History  Procedure Laterality Date  . Status post bilateral tubal ligation.    . Abdominal hysterectomy  2010  . Breast biopsy  1987    History   Social History  . Marital Status: Married    Spouse Name: N/A  . Number of Children: N/A  . Years of Education: N/A    Occupational History  . Not on file.   Social History Main Topics  . Smoking status: Former Smoker    Types: Cigarettes    Quit date: 09/10/1977  . Smokeless tobacco: Never Used  . Alcohol Use: 0.6 oz/week    1 Standard drinks or equivalent per week  . Drug Use: No  . Sexual Activity: Yes   Other Topics Concern  . Not on file   Social History Narrative   She has remote history of minimal tobacco use, but has     not smoked in years.  She rarely consumes alcohol.     ROS: no fevers or chills, productive cough, hemoptysis, dysphasia, odynophagia, melena, hematochezia, dysuria, hematuria, rash, seizure activity, orthopnea, PND, pedal edema, claudication. Remaining systems are negative.  Physical Exam: Well-developed well-nourished in no acute distress.  Skin is warm and dry.  HEENT is normal.  Neck is supple.  Chest is clear to auscultation with normal expansion.  Cardiovascular exam is regular rate and rhythm.  Abdominal exam nontender or distended. No masses palpated. Extremities show no edema. neuro grossly intact  ECG sinus rhythm with occasional PACs. Normal axis. No ST changes.

## 2015-02-08 ENCOUNTER — Ambulatory Visit (INDEPENDENT_AMBULATORY_CARE_PROVIDER_SITE_OTHER): Payer: Medicare Other | Admitting: Cardiology

## 2015-02-08 ENCOUNTER — Encounter: Payer: Self-pay | Admitting: Cardiology

## 2015-02-08 VITALS — BP 126/90 | HR 84 | Ht 70.0 in | Wt 145.0 lb

## 2015-02-08 DIAGNOSIS — H5213 Myopia, bilateral: Secondary | ICD-10-CM | POA: Diagnosis not present

## 2015-02-08 DIAGNOSIS — I10 Essential (primary) hypertension: Secondary | ICD-10-CM

## 2015-02-08 NOTE — Assessment & Plan Note (Signed)
Continue aspirin and statin. Followed by vascular surgery. 

## 2015-02-08 NOTE — Patient Instructions (Signed)
Your physician wants you to follow-up in: ONE YEAR WITH DR CRENSHAW You will receive a reminder letter in the mail two months in advance. If you don't receive a letter, please call our office to schedule the follow-up appointment.  

## 2015-02-08 NOTE — Assessment & Plan Note (Signed)
Continue statin. Lipids and liver monitored by primary care. 

## 2015-02-08 NOTE — Assessment & Plan Note (Signed)
Blood pressure controlled. Continue present medications. Potassium and renal function monitored by primary care. 

## 2015-02-17 ENCOUNTER — Encounter: Payer: Self-pay | Admitting: Family Medicine

## 2015-02-18 DIAGNOSIS — M542 Cervicalgia: Secondary | ICD-10-CM | POA: Diagnosis not present

## 2015-03-07 ENCOUNTER — Encounter: Payer: Self-pay | Admitting: *Deleted

## 2015-03-29 DIAGNOSIS — G43719 Chronic migraine without aura, intractable, without status migrainosus: Secondary | ICD-10-CM | POA: Diagnosis not present

## 2015-03-29 DIAGNOSIS — G43019 Migraine without aura, intractable, without status migrainosus: Secondary | ICD-10-CM | POA: Diagnosis not present

## 2015-04-08 ENCOUNTER — Ambulatory Visit (INDEPENDENT_AMBULATORY_CARE_PROVIDER_SITE_OTHER): Payer: Medicare Other | Admitting: Ophthalmology

## 2015-04-13 ENCOUNTER — Other Ambulatory Visit: Payer: Self-pay | Admitting: Cardiology

## 2015-04-13 NOTE — Telephone Encounter (Signed)
Rx(s) sent to pharmacy electronically.  

## 2015-04-25 ENCOUNTER — Other Ambulatory Visit: Payer: Self-pay | Admitting: Cardiology

## 2015-06-15 DIAGNOSIS — D1801 Hemangioma of skin and subcutaneous tissue: Secondary | ICD-10-CM | POA: Diagnosis not present

## 2015-06-15 DIAGNOSIS — L821 Other seborrheic keratosis: Secondary | ICD-10-CM | POA: Diagnosis not present

## 2015-06-15 DIAGNOSIS — D225 Melanocytic nevi of trunk: Secondary | ICD-10-CM | POA: Diagnosis not present

## 2015-06-15 DIAGNOSIS — D485 Neoplasm of uncertain behavior of skin: Secondary | ICD-10-CM | POA: Diagnosis not present

## 2015-07-04 DIAGNOSIS — G8929 Other chronic pain: Secondary | ICD-10-CM | POA: Diagnosis not present

## 2015-07-04 DIAGNOSIS — M542 Cervicalgia: Secondary | ICD-10-CM | POA: Diagnosis not present

## 2015-07-04 DIAGNOSIS — M5136 Other intervertebral disc degeneration, lumbar region: Secondary | ICD-10-CM | POA: Diagnosis not present

## 2015-08-18 ENCOUNTER — Encounter: Payer: Self-pay | Admitting: Family

## 2015-08-24 ENCOUNTER — Encounter: Payer: Self-pay | Admitting: Family

## 2015-08-24 ENCOUNTER — Ambulatory Visit (HOSPITAL_COMMUNITY)
Admission: RE | Admit: 2015-08-24 | Discharge: 2015-08-24 | Disposition: A | Discharge status: Home / Self Care | Payer: Medicare Other | Source: Ambulatory Visit | Attending: Family | Admitting: Family

## 2015-08-24 ENCOUNTER — Ambulatory Visit (INDEPENDENT_AMBULATORY_CARE_PROVIDER_SITE_OTHER): Payer: Medicare Other | Admitting: Family

## 2015-08-24 VITALS — BP 129/70 | HR 76 | Temp 97.4°F | Resp 16 | Ht 70.0 in | Wt 145.0 lb

## 2015-08-24 DIAGNOSIS — I773 Arterial fibromuscular dysplasia: Secondary | ICD-10-CM | POA: Diagnosis not present

## 2015-08-24 DIAGNOSIS — E785 Hyperlipidemia, unspecified: Secondary | ICD-10-CM | POA: Diagnosis not present

## 2015-08-24 DIAGNOSIS — I6523 Occlusion and stenosis of bilateral carotid arteries: Secondary | ICD-10-CM | POA: Diagnosis not present

## 2015-08-24 DIAGNOSIS — I10 Essential (primary) hypertension: Secondary | ICD-10-CM | POA: Diagnosis not present

## 2015-08-24 NOTE — Progress Notes (Signed)
Chief Complaint: Extracranial Carotid Artery Stenosis   History of Present Illness  Christine Daniels is a 69 y.o. female whom Dr. Scot Dock have been following since 1999 with carotid disease. Her arteries were noted to be somewhat tortuous with possibility of some fibromuscular dysplasia. She was initially referred for carotid bruit auscultated by her PCP.  Patient has not had previous CEA nor carotid artery stent.  The patient denies any history of TIA or stroke symptoms, specifically the patient denies a history of amaurosis fugax or monocular blindness, denies a history unilateral of facial drooping, denies a history of hemiplegia, and denies a history of receptive or expressive aphasia.  Patient denies New Medical or Surgical History.  Pt Diabetic: No Pt smoker: former smoker, quit 1979  Pt meds include: Statin : Yes Betablocker: Yes ASA: Yes Other anticoagulants/antiplatelets: no    Past Medical History  Diagnosis Date  . Hypertension   . CEREBROVASCULAR DISEASE   . HYPERLIPIDEMIA   . Carotid artery occlusion   . Arthritis   . Migraine     Social History Social History  Substance Use Topics  . Smoking status: Former Smoker    Types: Cigarettes    Quit date: 09/10/1977  . Smokeless tobacco: Never Used  . Alcohol Use: 0.6 oz/week    1 Standard drinks or equivalent per week    Family History Family History  Problem Relation Age of Onset  . Cancer Mother 31    Breast  . Hyperlipidemia Father   . Stroke Father 34    Surgical History Past Surgical History  Procedure Laterality Date  . Status post bilateral tubal ligation.    . Abdominal hysterectomy  2010  . Breast biopsy  1987    No Known Allergies  Current Outpatient Prescriptions  Medication Sig Dispense Refill  . aspirin 81 MG tablet Take 81 mg by mouth daily.      . Coenzyme Q10 (CO Q10) 100 MG TABS Take 1 tablet by mouth every other day.     . loratadine-pseudoephedrine (CLARITIN-D 12-HOUR)  5-120 MG per tablet Take 1 tablet by mouth daily.    . metoprolol succinate (TOPROL-XL) 25 MG 24 hr tablet TAKE 1 TABLET EVERY DAY 90 tablet 2  . potassium chloride SA (K-DUR,KLOR-CON) 20 MEQ tablet Take 1 tablet (20 mEq total) by mouth every other day. 15 tablet 0  . potassium chloride SA (K-DUR,KLOR-CON) 20 MEQ tablet Take one tablet (64mEq) by mouth every other day.  15 tablet 6  . rosuvastatin (CRESTOR) 5 MG tablet Take 1 tablet (5 mg total) by mouth every other day. 90 tablet 1  . triamterene-hydrochlorothiazide (MAXZIDE) 75-50 MG per tablet Take 1 tablet by mouth daily. 100 tablet 3  . zolmitriptan (ZOMIG) 5 MG tablet Take 1 tablet (5 mg total) by mouth as needed. (Patient taking differently: Take 5 mg by mouth as needed for migraine. ) 10 tablet 5  . zonisamide (ZONEGRAN) 25 MG capsule Take 3 capsules (75 mg total) by mouth daily. (Patient taking differently: Take 75 mg by mouth daily. Migraine) 300 capsule 3  . butalbital-aspirin-caffeine (FIORINAL) 50-325-40 MG per capsule Take 1 capsule by mouth as needed. Reported on 08/24/2015     No current facility-administered medications for this visit.    Review of Systems : See HPI for pertinent positives and negatives.  Physical Examination  Filed Vitals:   08/24/15 0919 08/24/15 0921  BP: 125/66 129/70  Pulse: 76 76  Temp:  97.4 F (36.3 C)  TempSrc:  Oral  Resp:  16  Height:  5\' 10"  (1.778 m)  Weight:  145 lb (65.772 kg)  SpO2:  100%   Body mass index is 20.81 kg/(m^2).  General: WDWN female in NAD GAIT: normal Eyes: PERRLA Pulmonary: CTAB, no rales,  rhonchi, or wheezing.  Cardiac: regular rhythm, no detected murmur.  VASCULAR EXAM Carotid Bruits Left Right   Negative Negative    Radial pulses are 2+ palpable and equal.      LE Pulses LEFT RIGHT   POPLITEAL  not palpable  not palpable   POSTERIOR TIBIAL  palpable   palpable    DORSALIS PEDIS  ANTERIOR TIBIAL palpable  palpable     Gastrointestinal: soft, nontender, BS WNL, no r/g,no palpable masses.  Musculoskeletal: No muscle atrophy/wasting. M/S 5/5 throughout, Extremities without ischemic changes.  Neurologic: A&O X 3; Appropriate Affect, Speech is normal CN 2-12 intact, Pain and light touch intact in extremities, Motor exam as listed above.          Non-Invasive Vascular Imaging CAROTID DUPLEX 08/24/2015   No significant stenosis of the bilateral external or common carotid arteries. No evidence of right ICA stenosis; however, an increased velocity at the right mid ICA is noted which may be due to vessel tortuosity vs fibromuscular dysplasia. 1-39% left proximal ICA stenosis. No significant change from the exam on 08/25/14.   Assessment: Christine Daniels is a 69 y.o. female whom Dr. Scot Dock have been following since 1999 with carotid disease. Her arteries were noted to be somewhat tortuous with possibility of some fibromuscular dysplasia. She has no hx of stroke or TIA. Today's carotid duplex suggests no evidence of right ICA stenosis; however, an increased velocity at the right mid ICA is noted which may be due to vessel tortuosity vs fibromuscular dysplasia. 1-39% left proximal ICA stenosis. No significant change from the exam on 08/25/14.   Plan: Follow-up in 1 year with Carotid Duplex scan.   I discussed in depth with the patient the nature of atherosclerosis, and emphasized the importance of maximal medical management including strict control of blood pressure, blood glucose, and lipid levels, obtaining regular exercise, and continued cessation of smoking.  The patient is aware that without maximal medical management the underlying atherosclerotic disease process will progress, limiting the benefit of any interventions. The patient was  given information about stroke prevention and what symptoms should prompt the patient to seek immediate medical care. Thank you for allowing [place] to participate in this patient's care.  Clemon Chambers, RN, MSN, FNP-C [facility] Office: [phone]  Clinic Physician: Scot Dock  08/24/2015 9:49 AM

## 2015-08-24 NOTE — Patient Instructions (Signed)
Stroke Prevention Some medical conditions and behaviors are associated with an increased chance of having a stroke. You may prevent a stroke by making healthy choices and managing medical conditions. HOW CAN I REDUCE MY RISK OF HAVING A STROKE?   Stay physically active. Get at least 30 minutes of activity on most or all days.  Do not smoke. It may also be helpful to avoid exposure to secondhand smoke.  Limit alcohol use. Moderate alcohol use is considered to be:  No more than 2 drinks per day for men.  No more than 1 drink per day for nonpregnant women.  Eat healthy foods. This involves:  Eating 5 or more servings of fruits and vegetables a day.  Making dietary changes that address high blood pressure (hypertension), high cholesterol, diabetes, or obesity.  Manage your cholesterol levels.  Making food choices that are high in fiber and low in saturated fat, trans fat, and cholesterol may control cholesterol levels.  Take any prescribed medicines to control cholesterol as directed by your health care provider.  Manage your diabetes.  Controlling your carbohydrate and sugar intake is recommended to manage diabetes.  Take any prescribed medicines to control diabetes as directed by your health care provider.  Control your hypertension.  Making food choices that are low in salt (sodium), saturated fat, trans fat, and cholesterol is recommended to manage hypertension.  Ask your health care provider if you need treatment to lower your blood pressure. Take any prescribed medicines to control hypertension as directed by your health care provider.  If you are 18-39 years of age, have your blood pressure checked every 3-5 years. If you are 40 years of age or older, have your blood pressure checked every year.  Maintain a healthy weight.  Reducing calorie intake and making food choices that are low in sodium, saturated fat, trans fat, and cholesterol are recommended to manage  weight.  Stop drug abuse.  Avoid taking birth control pills.  Talk to your health care provider about the risks of taking birth control pills if you are over 35 years old, smoke, get migraines, or have ever had a blood clot.  Get evaluated for sleep disorders (sleep apnea).  Talk to your health care provider about getting a sleep evaluation if you snore a lot or have excessive sleepiness.  Take medicines only as directed by your health care provider.  For some people, aspirin or blood thinners (anticoagulants) are helpful in reducing the risk of forming abnormal blood clots that can lead to stroke. If you have the irregular heart rhythm of atrial fibrillation, you should be on a blood thinner unless there is a good reason you cannot take them.  Understand all your medicine instructions.  Make sure that other conditions (such as anemia or atherosclerosis) are addressed. SEEK IMMEDIATE MEDICAL CARE IF:   You have sudden weakness or numbness of the face, arm, or leg, especially on one side of the body.  Your face or eyelid droops to one side.  You have sudden confusion.  You have trouble speaking (aphasia) or understanding.  You have sudden trouble seeing in one or both eyes.  You have sudden trouble walking.  You have dizziness.  You have a loss of balance or coordination.  You have a sudden, severe headache with no known cause.  You have new chest pain or an irregular heartbeat. Any of these symptoms may represent a serious problem that is an emergency. Do not wait to see if the symptoms will   go away. Get medical help at once. Call your local emergency services (911 in [place] Do not drive yourself to the hospital.   This information is not intended to replace advice given to you by your health care provider. Make sure you discuss any questions you have with your health care provider.   Document Released: 10/04/2004 Document Revised: 09/17/2014 Document Reviewed:  02/27/2013 Elsevier Interactive Patient Education 2016 Else[employer]  

## 2015-08-29 ENCOUNTER — Telehealth: Payer: Self-pay | Admitting: Family Medicine

## 2015-08-29 NOTE — Telephone Encounter (Signed)
Pt wants to know if her tdap is still good. She is having her first grandchild and wants to be protected  cb is [phone]

## 2015-08-29 NOTE — Telephone Encounter (Signed)
Spoke to pt and advised TDAP received in 2013 and does not have to be repeated

## 2015-08-31 ENCOUNTER — Ambulatory Visit: Payer: Medicare Other | Admitting: Family

## 2015-08-31 ENCOUNTER — Encounter (HOSPITAL_COMMUNITY): Payer: Medicare Other

## 2015-08-31 ENCOUNTER — Other Ambulatory Visit (HOSPITAL_COMMUNITY): Payer: Medicare Other

## 2015-09-07 ENCOUNTER — Encounter: Payer: Self-pay | Admitting: Family Medicine

## 2015-09-20 DIAGNOSIS — G43019 Migraine without aura, intractable, without status migrainosus: Secondary | ICD-10-CM | POA: Diagnosis not present

## 2015-09-20 DIAGNOSIS — Z79899 Other long term (current) drug therapy: Secondary | ICD-10-CM | POA: Diagnosis not present

## 2015-09-20 DIAGNOSIS — R51 Headache: Secondary | ICD-10-CM | POA: Diagnosis not present

## 2015-09-20 DIAGNOSIS — G43719 Chronic migraine without aura, intractable, without status migrainosus: Secondary | ICD-10-CM | POA: Diagnosis not present

## 2015-11-04 DIAGNOSIS — M5136 Other intervertebral disc degeneration, lumbar region: Secondary | ICD-10-CM | POA: Diagnosis not present

## 2015-11-04 DIAGNOSIS — G8929 Other chronic pain: Secondary | ICD-10-CM | POA: Diagnosis not present

## 2015-11-04 DIAGNOSIS — M542 Cervicalgia: Secondary | ICD-10-CM | POA: Diagnosis not present

## 2015-11-16 DIAGNOSIS — D485 Neoplasm of uncertain behavior of skin: Secondary | ICD-10-CM | POA: Diagnosis not present

## 2015-11-30 ENCOUNTER — Other Ambulatory Visit: Payer: Self-pay | Admitting: Cardiology

## 2015-11-30 NOTE — Telephone Encounter (Signed)
Rx(s) sent to pharmacy electronically.  

## 2016-01-03 DIAGNOSIS — J029 Acute pharyngitis, unspecified: Secondary | ICD-10-CM | POA: Diagnosis not present

## 2016-01-08 ENCOUNTER — Other Ambulatory Visit: Payer: Self-pay | Admitting: Cardiology

## 2016-01-09 NOTE — Telephone Encounter (Signed)
Rx refill sent to pharmacy. 

## 2016-02-14 DIAGNOSIS — H5213 Myopia, bilateral: Secondary | ICD-10-CM | POA: Diagnosis not present

## 2016-02-16 ENCOUNTER — Telehealth: Payer: Self-pay | Admitting: Family Medicine

## 2016-02-16 NOTE — Telephone Encounter (Signed)
Left message asking pt to call office per thn Needs to schedule either follow up or cpx with pcp

## 2016-02-18 ENCOUNTER — Other Ambulatory Visit: Payer: Self-pay | Admitting: Cardiology

## 2016-02-20 NOTE — Telephone Encounter (Signed)
Rx request sent to pharmacy.  

## 2016-02-21 DIAGNOSIS — Z01419 Encounter for gynecological examination (general) (routine) without abnormal findings: Secondary | ICD-10-CM | POA: Diagnosis not present

## 2016-02-21 DIAGNOSIS — Z1231 Encounter for screening mammogram for malignant neoplasm of breast: Secondary | ICD-10-CM | POA: Diagnosis not present

## 2016-03-15 DIAGNOSIS — G43019 Migraine without aura, intractable, without status migrainosus: Secondary | ICD-10-CM | POA: Diagnosis not present

## 2016-03-15 DIAGNOSIS — G43719 Chronic migraine without aura, intractable, without status migrainosus: Secondary | ICD-10-CM | POA: Diagnosis not present

## 2016-03-15 NOTE — Progress Notes (Signed)
HPI: FU hypertension; also with cerebrovascular disease followed by vascular surgery. Since last seen, the patient denies any dyspnea on exertion, orthopnea, PND, pedal edema, palpitations, syncope or chest pain.   Current Outpatient Prescriptions  Medication Sig Dispense Refill  . aspirin 81 MG tablet Take 81 mg by mouth daily.      . Coenzyme Q10 (CO Q10) 100 MG TABS Take 1 tablet by mouth every other day.     Marland Kitchen KLOR-CON M20 20 MEQ tablet TAKE ONE TABLET (20MEQ) BY MOUTH EVERY OTHER DAY. 15 tablet 6  . loratadine-pseudoephedrine (CLARITIN-D 12-HOUR) 5-120 MG per tablet Take 1 tablet by mouth daily.    . metoprolol succinate (TOPROL-XL) 25 MG 24 hr tablet TAKE 1 TABLET EVERY DAY 90 tablet 0  . rosuvastatin (CRESTOR) 5 MG tablet Take 1 tablet (5 mg total) by mouth every other day. 90 tablet 1  . triamterene-hydrochlorothiazide (MAXZIDE) 75-50 MG per tablet Take 1 tablet by mouth daily. 100 tablet 3  . zolmitriptan (ZOMIG) 5 MG tablet Take 1 tablet (5 mg total) by mouth as needed. (Patient taking differently: Take 5 mg by mouth as needed for migraine. ) 10 tablet 5  . zonisamide (ZONEGRAN) 25 MG capsule Take 3 capsules (75 mg total) by mouth daily. (Patient taking differently: Take 75 mg by mouth daily. Migraine) 300 capsule 3   No current facility-administered medications for this visit.     Past Medical History  Diagnosis Date  . Hypertension   . CEREBROVASCULAR DISEASE   . HYPERLIPIDEMIA   . Carotid artery occlusion   . Arthritis   . Migraine     Past Surgical History  Procedure Laterality Date  . Status post bilateral tubal ligation.    . Abdominal hysterectomy  2010  . Breast biopsy  1987    Social History   Social History  . Marital Status: Married    Spouse Name: N/A  . Number of Children: N/A  . Years of Education: N/A   Occupational History  . Not on file.   Social History Main Topics  . Smoking status: Former Smoker    Types: Cigarettes    Quit  date: 09/10/1977  . Smokeless tobacco: Never Used  . Alcohol Use: 0.6 oz/week    1 Standard drinks or equivalent per week  . Drug Use: No  . Sexual Activity: Yes   Other Topics Concern  . Not on file   Social History Narrative   She has remote history of minimal tobacco use, but has     not smoked in years.  She rarely consumes alcohol.     Family History  Problem Relation Age of Onset  . Cancer Mother 48    Breast  . Hyperlipidemia Father   . Stroke Father 79    ROS: no fevers or chills, productive cough, hemoptysis, dysphasia, odynophagia, melena, hematochezia, dysuria, hematuria, rash, seizure activity, orthopnea, PND, pedal edema, claudication. Remaining systems are negative.  Physical Exam: Well-developed well-nourished in no acute distress.  Skin is warm and dry.  HEENT is normal.  Neck is supple.  Chest is clear to auscultation with normal expansion.  Cardiovascular exam is regular rate and rhythm.  Abdominal exam nontender or distended. No masses palpated. Extremities show no edema. neuro grossly intact  ECG Sinus rhythm at a rate of 65. No ST changes.  Assessment and plan  1 hyperlipidemia-continue statin. Lipids and liver monitored by primary care. 2 hypertension-Blood pressure mildly elevated. I have asked her  to follow this at home and we will advance metoprolol if it remains high. Potassium and renal function monitored by primary care.  3 cerebrovascular disease-followed by vascular surgery. Continue aspirin and statin.  Kirk Ruths, MD

## 2016-03-20 ENCOUNTER — Encounter: Payer: Self-pay | Admitting: Cardiology

## 2016-03-20 ENCOUNTER — Ambulatory Visit (INDEPENDENT_AMBULATORY_CARE_PROVIDER_SITE_OTHER): Payer: Medicare Other | Admitting: Cardiology

## 2016-03-20 VITALS — BP 148/84 | HR 65 | Ht 70.0 in | Wt 146.0 lb

## 2016-03-20 DIAGNOSIS — I10 Essential (primary) hypertension: Secondary | ICD-10-CM

## 2016-03-20 DIAGNOSIS — E785 Hyperlipidemia, unspecified: Secondary | ICD-10-CM

## 2016-03-20 DIAGNOSIS — I6523 Occlusion and stenosis of bilateral carotid arteries: Secondary | ICD-10-CM | POA: Diagnosis not present

## 2016-03-20 NOTE — Patient Instructions (Signed)
Your physician wants you to follow-up in: 1 year with Dr. Crenshaw. You will receive a reminder letter in the mail two months in advance. If you don't receive a letter, please call our office to schedule the follow-up appointment.   If you need a refill on your cardiac medications before your next appointment, please call your pharmacy.   

## 2016-03-22 ENCOUNTER — Ambulatory Visit (INDEPENDENT_AMBULATORY_CARE_PROVIDER_SITE_OTHER): Payer: Medicare Other | Admitting: Family Medicine

## 2016-03-22 ENCOUNTER — Encounter: Payer: Self-pay | Admitting: Family Medicine

## 2016-03-22 VITALS — BP 118/76 | HR 81 | Temp 98.0°F | Wt 144.2 lb

## 2016-03-22 DIAGNOSIS — I6523 Occlusion and stenosis of bilateral carotid arteries: Secondary | ICD-10-CM

## 2016-03-22 DIAGNOSIS — Z1159 Encounter for screening for other viral diseases: Secondary | ICD-10-CM

## 2016-03-22 DIAGNOSIS — Z8669 Personal history of other diseases of the nervous system and sense organs: Secondary | ICD-10-CM | POA: Diagnosis not present

## 2016-03-22 DIAGNOSIS — I10 Essential (primary) hypertension: Secondary | ICD-10-CM | POA: Diagnosis not present

## 2016-03-22 DIAGNOSIS — I679 Cerebrovascular disease, unspecified: Secondary | ICD-10-CM

## 2016-03-22 DIAGNOSIS — E785 Hyperlipidemia, unspecified: Secondary | ICD-10-CM | POA: Diagnosis not present

## 2016-03-22 LAB — LIPID PANEL
Cholesterol: 222 mg/dL — ABNORMAL HIGH (ref 0–200)
HDL: 43.6 mg/dL (ref >39.00)
LDL Cholesterol: 150 mg/dL — ABNORMAL HIGH (ref 0–99)
NONHDL: 178.59
TRIGLYCERIDES: 142 mg/dL (ref 0.0–149.0)
Total CHOL/HDL Ratio: 5
VLDL: 28.4 mg/dL (ref 0.0–40.0)

## 2016-03-22 LAB — CBC WITH DIFFERENTIAL/PLATELET
BASOS PCT: 0.5 % (ref 0.0–3.0)
Basophils Absolute: 0 10*3/uL (ref 0.0–0.1)
Eosinophils Absolute: 0.2 10*3/uL (ref 0.0–0.7)
Eosinophils Relative: 2.2 % (ref 0.0–5.0)
HEMATOCRIT: 42.7 % (ref 36.0–46.0)
Hemoglobin: 14.2 g/dL (ref 12.0–15.0)
LYMPHS ABS: 1.9 10*3/uL (ref 0.7–4.0)
LYMPHS PCT: 23.3 % (ref 12.0–46.0)
MCHC: 33.2 g/dL (ref 30.0–36.0)
MCV: 91.1 fl (ref 78.0–100.0)
Monocytes Absolute: 0.8 10*3/uL (ref 0.1–1.0)
Monocytes Relative: 10 % (ref 3.0–12.0)
NEUTROS ABS: 5.2 10*3/uL (ref 1.4–7.7)
NEUTROS PCT: 64 % (ref 43.0–77.0)
PLATELETS: 311 10*3/uL (ref 150.0–400.0)
RBC: 4.69 Mil/uL (ref 3.87–5.11)
RDW: 13.1 % (ref 11.5–15.5)
WBC: 8.1 10*3/uL (ref 4.0–10.5)

## 2016-03-22 LAB — COMPREHENSIVE METABOLIC PANEL
ALT: 17 U/L (ref 0–35)
AST: 21 U/L (ref 0–37)
Albumin: 4.4 g/dL (ref 3.5–5.2)
Alkaline Phosphatase: 66 U/L (ref 39–117)
BILIRUBIN TOTAL: 1 mg/dL (ref 0.2–1.2)
BUN: 28 mg/dL — ABNORMAL HIGH (ref 6–23)
CALCIUM: 9.9 mg/dL (ref 8.4–10.5)
CO2: 34 meq/L — AB (ref 19–32)
Chloride: 91 mEq/L — ABNORMAL LOW (ref 96–112)
Creatinine, Ser: 1.08 mg/dL (ref 0.40–1.20)
GFR: 53.43 mL/min — AB (ref >60.00)
GLUCOSE: 108 mg/dL — AB (ref 70–99)
POTASSIUM: 3.2 meq/L — AB (ref 3.5–5.1)
Sodium: 148 mEq/L — ABNORMAL HIGH (ref 135–145)
Total Protein: 6.9 g/dL (ref 6.0–8.3)

## 2016-03-22 LAB — TSH: TSH: 2.69 u[IU]/mL (ref 0.35–4.50)

## 2016-03-22 NOTE — Patient Instructions (Signed)
Great to see you.  We will call you with your results. 

## 2016-03-22 NOTE — Progress Notes (Signed)
Pre visit review using our clinic review tool, if applicable. No additional management support is needed unless otherwise documented below in the visit note. 

## 2016-03-22 NOTE — Progress Notes (Signed)
Subjective:   Patient ID: Christine Daniels, female    DOB: [birth year], 70 y.o.   MRN: [id number]  Christine Daniels is a pleasant 70 y.o. year old female with h/o HLD, HTN, CVA, carotid stenosis, who presents to clinic today with Follow-up  on 03/22/2016  HPI:  I have not seen her since she established care with me nearly two years ago.   HTN- has been well controlled on current dose of Maxzide and Toprol XL. Followed by Dr. Stanford Breed.  Last saw Dr. Stanford Breed on 03/20/16- note reviewed. No changes made.  HLD- On crestor 5 mg daily.  Very active.  Overdue for labs. Wt Readings from Last 3 Encounters:  03/22/16 144 lb 4 oz (65.431 kg)  03/20/16 146 lb (66.225 kg)  08/24/15 145 lb (65.772 kg)    Lab Results  Component Value Date   CHOL 189 08/14/2012   HDL 40.50 08/14/2012   LDLCALC 121* 09/17/2011   LDLDIRECT 125.3 08/14/2012   TRIG 213.0* 08/14/2012   CHOLHDL 5 08/14/2012   Lab Results  Component Value Date   ALT 19 08/14/2012   AST 28 08/14/2012   ALKPHOS 82 08/14/2012   BILITOT 0.7 08/14/2012   H/o right ICA stenosis- asymptomatic- followed by [facility]-tortuosity may be suggestive of fibromuscular dysplasia. Last saw Clemon Chambers, NP on 08/24/15- note reviewed. Follow up duplex scan in 1 year recommended.   CAROTID DUPLEX 08/24/2015  No significant stenosis of the bilateral external or common carotid arteries. No evidence of right ICA stenosis; however, an increased velocity at the right mid ICA is noted which may be due to vessel tortuosity vs fibromuscular dysplasia. 1-39% left proximal ICA stenosis. No significant change from the exam on 08/25/14.  Migraines- takes Zonegran 75  Mg daily with Zomig as needed for abortive therapy. Follow[facility] by Dr. Domingo [facility].    Current Outpatient Prescriptions on File Prior to Visit  Medication Sig Dispense Refill  . aspirin 81 MG tablet Take 81 mg by mouth daily.      . Coenzyme Q10 (CO Q10) 100 MG  TABS Take 1 tablet by mouth every other day.     Marland Kitchen KLOR-CON M20 20 MEQ tablet TAKE ONE TABLET (20MEQ) BY MOUTH EVERY OTHER DAY. 15 tablet 6  . loratadine-pseudoephedrine (CLARITIN-D 12-HOUR) 5-120 MG per tablet Take 1 tablet by mouth daily.    . metoprolol succinate (TOPROL-XL) 25 MG 24 hr tablet TAKE 1 TABLET EVERY DAY 90 tablet 0  . rosuvastatin (CRESTOR) 5 MG tablet Take 1 tablet (5 mg total) by mouth every other day. 90 tablet 1  . triamterene-hydrochlorothiazide (MAXZIDE) 75-50 MG per tablet Take 1 tablet by mouth daily. 100 tablet 3  . zolmitriptan (ZOMIG) 5 MG tablet Take 1 tablet (5 mg total) by mouth as needed. (Patient taking differently: Take 5 mg by mouth as needed for migraine. ) 10 tablet 5  . zonisamide (ZONEGRAN) 25 MG capsule Take 3 capsules (75 mg total) by mouth daily. (Patient taking differently: Take 75 mg by mouth daily. Migraine) 300 capsule 3   No current facility-administered medications on file prior to visit.    No Known Allergies  Past Medical History  Diagnosis Date  . Hypertension   . CEREBROVASCULAR DISEASE   . HYPERLIPIDEMIA   . Carotid artery occlusion   . Arthritis   . Migraine     Past Surgical History  Procedure Laterality Date  . Status post bilateral tubal ligation.    . Abdominal  hysterectomy  2010  . Breast biopsy  1987    Family History  Problem Relation Age of Onset  . Cancer Mother 61    Breast  . Hyperlipidemia Father   . Stroke Father 32    Social History   Social History  . Marital Status: Married    Spouse Name: N/A  . Number of Children: N/A  . Years of Education: N/A   Occupational History  . Not on file.   Social History Main Topics  . Smoking status: Former Smoker    Types: Cigarettes    Quit date: 09/10/1977  . Smokeless tobacco: Never Used  . Alcohol Use: 0.6 oz/week    1 Standard drinks or equivalent per week  . Drug Use: No  . Sexual Activity: Yes   Other Topics Concern  . Not on file   Social  History Narrative   She has remote history of minimal tobacco use, but has     not smoked in years.  She rarely consumes alcohol.    The PMH, PSH, Social History, Family History, Medications, and allergies have been reviewed in [facility], and have been updated if relevant.    Review of Systems  HENT: Negative.   Eyes: Negative.   Respiratory: Negative.   Cardiovascular: Negative.   Gastrointestinal: Negative.   Endocrine: Negative.   Genitourinary: Negative.   Musculoskeletal: Negative.   Skin: Negative.   Allergic/Immunologic: Negative.   Neurological: Negative.   Hematological: Negative.   Psychiatric/Behavioral: Negative.   All other systems reviewed and are negative.      Objective:    BP 118/76 mmHg  Pulse 81  Temp(Src) 98 F (36.7 C) (Oral)  Wt 144 lb 4 oz (65.431 kg)  SpO2 98%   Physical Exam  Constitutional: She appears well-developed and well-nourished.  HENT:  Head: Normocephalic and atraumatic.  Right Ear: External ear normal.  Left Ear: External ear normal.  Nose: Nose normal.  Mouth/Throat: Oropharynx is clear and moist.  Eyes: EOM are normal. Pupils are equal, round, and reactive to light.  Neck: Normal range of motion. Neck supple. No thyromegaly present.  Cardiovascular: Normal rate, regular rhythm, normal heart sounds and intact distal pulses. Exam reveals no gallop and no friction rub.  No murmur heard.  Pulmonary/Chest: Effort normal and breath sounds normal.  Abdominal: Soft. Bowel sounds are normal. She exhibits no distension and no mass. There is no tenderness. There is no rebound.  Musculoskeletal: Normal range of motion.  Lymphadenopathy:  She has no cervical adenopathy.  Neurological: She is alert. She has normal reflexes. No cranial nerve deficit. She exhibits normal muscle tone. Coordination normal.  Skin: Skin is warm and dry.  Psychiatric: She has a normal mood and affect. Her behavior is normal. Judgment and thought content normal.         Assessment & Plan:   Essential hypertension - Plan: Comprehensive metabolic panel  CEREBROVASCULAR DISEASE - Plan: TSH, CBC with Differential/Platelet  HLD (hyperlipidemia) - Plan: Lipid panel  History of migraine headaches  Carotid stenosis, bilateral No Follow-up on file.

## 2016-03-22 NOTE — Assessment & Plan Note (Signed)
Followed by cardiology- was just seen by Dr. Stanford Breed this week. Notes reviewed. Continue current rxs. Due for labs today.  Orders Placed This Encounter  Procedures  . Comprehensive metabolic panel  . Lipid panel  . TSH  . CBC with Differential/Platelet

## 2016-03-22 NOTE — Assessment & Plan Note (Signed)
Continue statin. Due for lipid panel and CMET today.

## 2016-03-22 NOTE — Assessment & Plan Note (Signed)
Followed by Vascular. Notes reviewed. Continue medical management.

## 2016-03-22 NOTE — Assessment & Plan Note (Signed)
Well controlled on current rxs. Also followed by Dr. Domingo Cocking.

## 2016-03-23 LAB — HEPATITIS C ANTIBODY: HCV Ab: NEGATIVE

## 2016-03-24 ENCOUNTER — Encounter: Payer: Self-pay | Admitting: Family Medicine

## 2016-03-25 ENCOUNTER — Other Ambulatory Visit: Payer: Self-pay | Admitting: Cardiology

## 2016-03-26 NOTE — Telephone Encounter (Signed)
Rx(s) sent to pharmacy electronically.  

## 2016-04-16 ENCOUNTER — Other Ambulatory Visit: Payer: Self-pay | Admitting: Cardiology

## 2016-04-17 NOTE — Telephone Encounter (Signed)
Rx(s) sent to pharmacy electronically.  

## 2016-05-02 ENCOUNTER — Telehealth: Payer: Self-pay | Admitting: Family Medicine

## 2016-05-02 NOTE — Telephone Encounter (Signed)
Left message on home phone asking pt to call office °Pt needs to schedule medicare wellness appointment before 06/09/16 per brian email dated 05/02/16 °

## 2016-05-09 ENCOUNTER — Ambulatory Visit (INDEPENDENT_AMBULATORY_CARE_PROVIDER_SITE_OTHER): Payer: Medicare Other | Admitting: Family Medicine

## 2016-05-09 ENCOUNTER — Encounter: Payer: Self-pay | Admitting: Family Medicine

## 2016-05-09 VITALS — BP 120/76 | HR 69 | Temp 98.5°F | Wt 145.8 lb

## 2016-05-09 DIAGNOSIS — R350 Frequency of micturition: Secondary | ICD-10-CM | POA: Diagnosis not present

## 2016-05-09 LAB — POC URINALSYSI DIPSTICK (AUTOMATED)
Bilirubin, UA: NEGATIVE
Glucose, UA: NEGATIVE
Ketones, UA: NEGATIVE
Nitrite, UA: NEGATIVE
PH UA: 6
PROTEIN UA: NEGATIVE
Spec Grav, UA: 1.015
UROBILINOGEN UA: 0.2

## 2016-05-09 MED ORDER — SULFAMETHOXAZOLE-TRIMETHOPRIM 800-160 MG PO TABS: 1.0000 | ORAL_TABLET | Freq: Two times a day (BID) | ORAL | 0 refills | Status: DC

## 2016-05-09 NOTE — Progress Notes (Signed)
Pre visit review using our clinic review tool, if applicable. No additional management support is needed unless otherwise documented below in the visit note. 

## 2016-05-09 NOTE — Patient Instructions (Signed)
Drink plenty of water and start the antibiotics today.  We'll contact you with your lab report.  Take care.   

## 2016-05-09 NOTE — Assessment & Plan Note (Signed)
Nontoxic, likely cystitis, septra, ucx, fluids, see AVS.  U/a d/w pt.

## 2016-05-09 NOTE — Progress Notes (Signed)
dysuria:yes duration of symptoms:  A few days abdominal pain: lower abd pain/pressure fevers:no back pain: lower back pain, this is atypical for patient.   vomiting:no other concerns: took AZO today, with some relief.  This feels similar to prev UTIs, but she doesn't have them frequently.    Meds, vitals, and allergies reviewed.   Per HPI unless specifically indicated in ROS section   GEN: nad, alert and oriented HEENT: mucous membranes moist NECK: supple CV: rrr.  PULM: ctab, no inc wob ABD: soft, +bs, suprapubic area minimally tender EXT: no edema BACK: no CVA pain

## 2016-05-11 LAB — URINE CULTURE

## 2016-05-31 ENCOUNTER — Encounter: Payer: Self-pay | Admitting: Family Medicine

## 2016-05-31 ENCOUNTER — Ambulatory Visit (INDEPENDENT_AMBULATORY_CARE_PROVIDER_SITE_OTHER): Payer: Medicare Other | Admitting: Family Medicine

## 2016-05-31 VITALS — BP 114/68 | HR 86 | Temp 97.9°F | Ht 67.5 in | Wt 145.5 lb

## 2016-05-31 DIAGNOSIS — I10 Essential (primary) hypertension: Secondary | ICD-10-CM | POA: Diagnosis not present

## 2016-05-31 DIAGNOSIS — E2839 Other primary ovarian failure: Secondary | ICD-10-CM

## 2016-05-31 DIAGNOSIS — E785 Hyperlipidemia, unspecified: Secondary | ICD-10-CM | POA: Diagnosis not present

## 2016-05-31 DIAGNOSIS — Z23 Encounter for immunization: Secondary | ICD-10-CM | POA: Diagnosis not present

## 2016-05-31 DIAGNOSIS — Z0000 Encounter for general adult medical examination without abnormal findings: Secondary | ICD-10-CM

## 2016-05-31 DIAGNOSIS — I6523 Occlusion and stenosis of bilateral carotid arteries: Secondary | ICD-10-CM

## 2016-05-31 LAB — COMPREHENSIVE METABOLIC PANEL
ALT: 19 U/L (ref 0–35)
AST: 22 U/L (ref 0–37)
Albumin: 4.1 g/dL (ref 3.5–5.2)
Alkaline Phosphatase: 66 U/L (ref 39–117)
BUN: 24 mg/dL — ABNORMAL HIGH (ref 6–23)
CHLORIDE: 99 meq/L (ref 96–112)
CO2: 34 meq/L — AB (ref 19–32)
Calcium: 9.4 mg/dL (ref 8.4–10.5)
Creatinine, Ser: 1 mg/dL (ref 0.40–1.20)
GFR: 58.36 mL/min — AB (ref >60.00)
GLUCOSE: 93 mg/dL (ref 70–99)
Potassium: 3.2 mEq/L — ABNORMAL LOW (ref 3.5–5.1)
Sodium: 140 mEq/L (ref 135–145)
Total Bilirubin: 0.6 mg/dL (ref 0.2–1.2)
Total Protein: 6.8 g/dL (ref 6.0–8.3)

## 2016-05-31 LAB — LDL CHOLESTEROL, DIRECT: LDL DIRECT: 166 mg/dL

## 2016-05-31 NOTE — Assessment & Plan Note (Signed)
Followed by vascular Medical management. No changes made.

## 2016-05-31 NOTE — Progress Notes (Signed)
Subjective:   Patient ID: Christine Daniels, female    DOB: 03-05-1946, 70 y.o.   MRN: [id number]  Christine Daniels is a pleasant 70 y.o. year old female with h/o HLD, HTN, CVA, carotid stenosis, who presents to clinic today with Annual Exam (Medicare)  on 05/31/2016  HPI:  I have personally reviewed the Medicare Annual Wellness questionnaire and have noted 1. The patient's medical and social history 2. Their use of alcohol, tobacco or illicit drugs 3. Their current medications and supplements 4. The patient's functional ability including ADL's, fall risks, home safety risks and hearing or visual             impairment. 5. Diet and physical activities 6. Evidence for depression or mood disorders  End of life wishes discussed and updated in Social History.  The roster of all physicians providing medical care to patient - is listed in the CareTeams section of the chart.   Remote h/o hysterectomy Zostavax 11/09/11 Prevnar 13 07/27/14 Mammogram 01/25/15  HTN- has been well controlled on current dose of Maxzide and Toprol XL. Followed by Dr. Stanford Breed.  Last saw Dr. Stanford Breed on 03/20/16- note reviewed. No changes made.  HLD- On crestor 5 mg daily.  Very active. LDL was elevated in 03/2016 and she has been taking her crestor more regularly since that time. Wt Readings from Last 3 Encounters:  05/31/16 145 lb 8 oz (66 kg)  05/09/16 145 lb 12 oz (66.1 kg)  03/22/16 144 lb 4 oz (65.4 kg)    Lab Results  Component Value Date   CHOL 222 (H) 03/22/2016   HDL 43.60 03/22/2016   LDLCALC 150 (H) 03/22/2016   LDLDIRECT 125.3 08/14/2012   TRIG 142.0 03/22/2016   CHOLHDL 5 03/22/2016   Lab Results  Component Value Date   ALT 17 03/22/2016   AST 21 03/22/2016   ALKPHOS 66 03/22/2016   BILITOT 1.0 03/22/2016   H/o right ICA stenosis- asymptomatic- followed by [facility]-tortuosity may be suggestive of fibromuscular dysplasia. Last saw Clemon Chambers, NP on 08/24/15- note reviewed. Follow up  duplex scan in 1 year recommended.   CAROTID DUPLEX 08/24/2015  No significant stenosis of the bilateral external or common carotid arteries. No evidence of right ICA stenosis; however, an increased velocity at the right mid ICA is noted which may be due to vessel tortuosity vs fibromuscular dysplasia. 1-39% left proximal ICA stenosis. No significant change from the exam on 08/25/14.  Migraines- takes Zonegran 75  Mg daily with Zomig as needed for abortive therapy. Followed by Dr. Domingo Cocking at [facility].    Current Outpatient Prescriptions on File Prior to Visit  Medication Sig Dispense Refill  . aspirin 81 MG tablet Take 81 mg by mouth daily.      . Coenzyme Q10 (CO Q10) 100 MG TABS Take 1 tablet by mouth every other day.     Marland Kitchen KLOR-CON M20 20 MEQ tablet TAKE ONE TABLET (20MEQ) BY MOUTH EVERY OTHER DAY. 15 tablet 6  . loratadine-pseudoephedrine (CLARITIN-D 12-HOUR) 5-120 MG per tablet Take 1 tablet by mouth daily.    . metoprolol succinate (TOPROL-XL) 25 MG 24 hr tablet TAKE 1 TABLET EVERY DAY 90 tablet 1  . rosuvastatin (CRESTOR) 5 MG tablet Take 1 tablet (5 mg total) by mouth every other day. 90 tablet 3  . triamterene-hydrochlorothiazide (MAXZIDE) 75-50 MG per tablet Take 1 tablet by mouth daily. 100 tablet 3  . zolmitriptan (ZOMIG) 5 MG tablet Take 1 tablet (5  mg total) by mouth as needed. (Patient taking differently: Take 5 mg by mouth as needed for migraine. ) 10 tablet 5   No current facility-administered medications on file prior to visit.     No Known Allergies  Past Medical History:  Diagnosis Date  . Arthritis   . Carotid artery occlusion   . CEREBROVASCULAR DISEASE   . HYPERLIPIDEMIA   . Hypertension   . Migraine     Past Surgical History:  Procedure Laterality Date  . ABDOMINAL HYSTERECTOMY  2010  . BREAST BIOPSY  1987  . Status post bilateral tubal ligation.      Family History  Problem Relation Age of Onset  . Cancer Mother 6     Breast  . Hyperlipidemia Father   . Stroke Father 22    Social History   Social History  . Marital status: Married    Spouse name: N/A  . Number of children: N/A  . Years of education: N/A   Occupational History  . Not on file.   Social History Main Topics  . Smoking status: Former Smoker    Types: Cigarettes    Quit date: 09/10/1977  . Smokeless tobacco: Never Used  . Alcohol use 0.6 oz/week    1 Standard drinks or equivalent per week  . Drug use: No  . Sexual activity: Yes   Other Topics Concern  . Not on file   Social History Narrative   She has remote history of minimal tobacco use, but has     not smoked in years.  She rarely consumes alcohol.    Desires CPR   The PMH, PSH, Social History, Family History, Medications, and allergies have been reviewed in [facility], and have been updated if relevant.    Review of Systems  HENT: Negative.   Eyes: Negative.   Respiratory: Negative.   Cardiovascular: Negative.   Gastrointestinal: Negative.   Endocrine: Negative.   Genitourinary: Negative.   Musculoskeletal: Negative.   Skin: Negative.   Allergic/Immunologic: Negative.   Neurological: Negative.   Hematological: Negative.   Psychiatric/Behavioral: Negative.   All other systems reviewed and are negative.      Objective:    BP 114/68   Pulse 86   Temp 97.9 F (36.6 C) (Oral)   Ht 5' 7.5" (1.715 m)   Wt 145 lb 8 oz (66 kg)   SpO2 97%   BMI 22.45 kg/m    Physical Exam  Constitutional: She appears well-developed and well-nourished.  HENT:  Head: Normocephalic and atraumatic.  Right Ear: External ear normal.  Left Ear: External ear normal.  Nose: Nose normal.  Mouth/Throat: Oropharynx is clear and moist.  Eyes: EOM are normal. Pupils are equal, round, and reactive to light.  Neck: Normal range of motion. Neck supple. No thyromegaly present.  Cardiovascular: Normal rate, regular rhythm, normal heart sounds and intact distal pulses. Exam reveals no gallop  and no friction rub.  No murmur heard.  Pulmonary/Chest: Effort normal and breath sounds normal.  Abdominal: Soft. Bowel sounds are normal. She exhibits no distension and no mass. There is no tenderness. There is no rebound.  Musculoskeletal: Normal range of motion.  Lymphadenopathy:  She has no cervical adenopathy.  Neurological: She is alert. She has normal reflexes. No cranial nerve deficit. She exhibits normal muscle tone. Coordination normal.  Skin: Skin is warm and dry.  Psychiatric: She has a normal mood and affect. Her behavior is normal. Judgment and thought content normal.  Assessment & Plan:   Medicare annual wellness visit, subsequent  HLD (hyperlipidemia)  Essential hypertension  Carotid stenosis, bilateral No Follow-up on file.

## 2016-05-31 NOTE — Assessment & Plan Note (Signed)
The patients weight, height, BMI and visual acuity have been recorded in the chart.  Cognitive function assessed.   I have made referrals, counseling and provided education to the patient based review of the above and I have provided the pt with a written personalized care plan for preventive services.  

## 2016-05-31 NOTE — Progress Notes (Signed)
Pre visit review using our clinic review tool, if applicable. No additional management support is needed unless otherwise documented below in the visit note. 

## 2016-05-31 NOTE — Patient Instructions (Signed)
Great to see you. Please call to schedule your bone density.

## 2016-05-31 NOTE — Assessment & Plan Note (Signed)
Deteriorated. She has increased frequency of crestor. Recheck LDL and CMET Today.

## 2016-05-31 NOTE — Assessment & Plan Note (Signed)
Well controlled.  No changes made. 

## 2016-06-05 ENCOUNTER — Encounter: Payer: Self-pay | Admitting: Family Medicine

## 2016-06-15 ENCOUNTER — Encounter: Payer: Self-pay | Admitting: Family Medicine

## 2016-06-15 ENCOUNTER — Ambulatory Visit (INDEPENDENT_AMBULATORY_CARE_PROVIDER_SITE_OTHER): Payer: Medicare Other | Admitting: Family Medicine

## 2016-06-15 VITALS — BP 140/90 | HR 77 | Temp 97.8°F | Wt 147.8 lb

## 2016-06-15 DIAGNOSIS — R300 Dysuria: Secondary | ICD-10-CM

## 2016-06-15 DIAGNOSIS — R350 Frequency of micturition: Secondary | ICD-10-CM | POA: Diagnosis not present

## 2016-06-15 DIAGNOSIS — N3090 Cystitis, unspecified without hematuria: Secondary | ICD-10-CM

## 2016-06-15 LAB — POCT URINALYSIS DIP (MANUAL ENTRY)
BILIRUBIN UA: NEGATIVE
BILIRUBIN UA: NEGATIVE
Blood, UA: NEGATIVE
Glucose, UA: NEGATIVE
Nitrite, UA: NEGATIVE
PH UA: 6
Protein Ur, POC: NEGATIVE
Spec Grav, UA: 1.01
Urobilinogen, UA: NEGATIVE

## 2016-06-15 MED ORDER — CEPHALEXIN 500 MG PO CAPS: 500.0000 mg | ORAL_CAPSULE | Freq: Three times a day (TID) | ORAL | 0 refills | Status: DC

## 2016-06-15 NOTE — Patient Instructions (Signed)

## 2016-06-15 NOTE — Progress Notes (Signed)
Subjective:    Patient ID: Christine Daniels, female    DOB: 1945/12/18, 70 y.o.   MRN: [id number]  HPI This is a 70 yo female who presents today with dysuria,hematuria, urinary frequency that started today. Has noticed different back pain and fatigue over last 2 days. ? Subjective fever this morning. No abdominal pain, no nausea, no vomiting. Took Azo with some relief. Had cystitis 8/17, prior to that, had not had in several years. Admits to poor water intake. 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   Past Medical History:  Diagnosis Date  . Arthritis   . Carotid artery occlusion   . CEREBROVASCULAR DISEASE   . HYPERLIPIDEMIA   . Hypertension   . Migraine    Past Surgical History:  Procedure Laterality Date  . ABDOMINAL HYSTERECTOMY  2010  . BREAST BIOPSY  1987  . Status post bilateral tubal ligation.     Family History  Problem Relation Age of Onset  . Cancer Mother 31    Breast  . Hyperlipidemia Father   . Stroke Father 42   Social History  Substance Use Topics  . Smoking status: Former Smoker    Types: Cigarettes    Quit date: 09/10/1977  . Smokeless tobacco: Never Used  . Alcohol use 0.6 oz/week    1 Standard drinks or equivalent per week      Review of Systems Per HPI    Objective:   Physical Exam  Constitutional: She is oriented to person, place, and time. She appears well-developed and well-nourished. No distress.  Eyes: Conjunctivae are normal.  Cardiovascular: Normal rate, regular rhythm and normal heart sounds.   Pulmonary/Chest: Effort normal and breath sounds normal.  Abdominal: She exhibits no distension. There is no tenderness.  Neurological: She is alert and oriented to person, place, and time.  Skin: Skin is warm and dry. She is not diaphoretic.  Psychiatric: She has a normal mood and affect. Her behavior is normal. Judgment and thought content normal.  Vitals reviewed.     BP 140/90   Pulse 77   Temp  97.8 F (36.6 C)   Wt 147 lb 12.8 oz (67 kg)   SpO2 97%   BMI 22.81 kg/m  Wt Readings from Last 3 Encounters:  06/15/16 147 lb 12.8 oz (67 kg)  05/31/16 145 lb 8 oz (66 kg)  05/09/16 145 lb 12 oz (66.1 kg)   Results for orders placed or performed in visit on 06/15/16  POCT urinalysis dipstick  Result Value Ref Range   Color, UA yellow yellow   Clarity, UA cloudy (A) clear   Glucose, UA negative negative   Bilirubin, UA negative negative   Ketones, POC UA negative negative   Spec Grav, UA 1.010    Blood, UA negative negative   pH, UA 6.0    Protein Ur, POC negative negative   Urobilinogen, UA negative    Nitrite, UA Negative Negative   Leukocytes, UA Trace (A) Negative        Assessment & Plan:  1. Dysuria - POCT urinalysis dipstick - Urine culture  2. Urinary frequency - POCT urinalysis dipstick - Urine culture  3. Cystitis - Provided written and verbal information regarding diagnosis and treatment. - RTC instructions reviiewed - encouraged good fluid intake and frequent urination - cephALEXin (KEFLEX) 500 MG capsule; Take 1 capsule (500 mg total) by mouth 3 (three) times daily.  Dispense: 21 capsule; Refill:  0   Clarene Reamer, FNP-BC  [facility]  06/15/2016 4:43 PM

## 2016-06-18 LAB — URINE CULTURE

## 2016-06-20 LAB — COLOGUARD

## 2016-07-18 ENCOUNTER — Telehealth: Payer: Self-pay | Admitting: *Deleted

## 2016-07-18 NOTE — Telephone Encounter (Signed)
Lm on pts vm requesting a call back. Cologuard sample is insufficient. They will contact her directly to set up arrangements to obtain another sample

## 2016-07-23 ENCOUNTER — Encounter: Payer: Self-pay | Admitting: Family Medicine

## 2016-07-24 ENCOUNTER — Encounter: Payer: Self-pay | Admitting: Family Medicine

## 2016-08-21 DIAGNOSIS — M5136 Other intervertebral disc degeneration, lumbar region: Secondary | ICD-10-CM | POA: Diagnosis not present

## 2016-08-21 DIAGNOSIS — G8929 Other chronic pain: Secondary | ICD-10-CM | POA: Diagnosis not present

## 2016-08-21 DIAGNOSIS — M542 Cervicalgia: Secondary | ICD-10-CM | POA: Diagnosis not present

## 2016-08-29 ENCOUNTER — Ambulatory Visit: Payer: Medicare Other | Admitting: Family

## 2016-08-29 ENCOUNTER — Encounter (HOSPITAL_COMMUNITY): Payer: Medicare Other

## 2016-09-19 DIAGNOSIS — G43719 Chronic migraine without aura, intractable, without status migrainosus: Secondary | ICD-10-CM | POA: Diagnosis not present

## 2016-09-19 DIAGNOSIS — G43019 Migraine without aura, intractable, without status migrainosus: Secondary | ICD-10-CM | POA: Diagnosis not present

## 2016-09-25 ENCOUNTER — Ambulatory Visit
Admission: RE | Admit: 2016-09-25 | Discharge: 2016-09-25 | Disposition: A | Discharge status: Home / Self Care | Payer: Medicare Other | Source: Ambulatory Visit | Attending: Family Medicine | Admitting: Family Medicine

## 2016-09-25 DIAGNOSIS — E2839 Other primary ovarian failure: Secondary | ICD-10-CM

## 2016-09-25 DIAGNOSIS — Z780 Asymptomatic menopausal state: Secondary | ICD-10-CM | POA: Diagnosis not present

## 2016-09-25 DIAGNOSIS — M85852 Other specified disorders of bone density and structure, left thigh: Secondary | ICD-10-CM | POA: Diagnosis not present

## 2016-09-26 ENCOUNTER — Ambulatory Visit (HOSPITAL_COMMUNITY): Payer: Medicare Other

## 2016-09-26 ENCOUNTER — Ambulatory Visit: Payer: Medicare Other | Admitting: Family

## 2016-10-05 ENCOUNTER — Encounter: Payer: Self-pay | Admitting: Family Medicine

## 2016-10-06 ENCOUNTER — Other Ambulatory Visit: Payer: Self-pay | Admitting: Cardiology

## 2016-10-08 ENCOUNTER — Encounter: Payer: Self-pay | Admitting: Family

## 2016-10-11 ENCOUNTER — Ambulatory Visit (HOSPITAL_COMMUNITY)
Admission: RE | Admit: 2016-10-11 | Discharge: 2016-10-11 | Disposition: A | Discharge status: Home / Self Care | Payer: Medicare Other | Source: Ambulatory Visit | Attending: Family | Admitting: Family

## 2016-10-11 ENCOUNTER — Ambulatory Visit (INDEPENDENT_AMBULATORY_CARE_PROVIDER_SITE_OTHER): Payer: Medicare Other | Admitting: Family

## 2016-10-11 ENCOUNTER — Encounter: Payer: Self-pay | Admitting: Family

## 2016-10-11 VITALS — BP 134/78 | HR 75 | Temp 98.2°F | Resp 18 | Ht 67.5 in | Wt 146.7 lb

## 2016-10-11 DIAGNOSIS — I773 Arterial fibromuscular dysplasia: Secondary | ICD-10-CM | POA: Diagnosis not present

## 2016-10-11 DIAGNOSIS — I6523 Occlusion and stenosis of bilateral carotid arteries: Secondary | ICD-10-CM | POA: Diagnosis not present

## 2016-10-11 LAB — VAS US CAROTID
LEFT ECA DIAS: -16 cm/s
LICADDIAS: -27 cm/s
LICAPSYS: 57 cm/s
Left CCA dist dias: -18 cm/s
Left CCA dist sys: -74 cm/s
Left CCA prox dias: 18 cm/s
Left CCA prox sys: 95 cm/s
Left ICA dist sys: -103 cm/s
Left ICA prox dias: 17 cm/s
RIGHT CCA MID DIAS: 16 cm/s
RIGHT ECA DIAS: -18 cm/s
RIGHT VERTEBRAL DIAS: 13 cm/s
Right CCA prox dias: 13 cm/s
Right CCA prox sys: 85 cm/s
Right cca dist sys: -85 cm/s

## 2016-10-11 NOTE — Progress Notes (Signed)
Chief Complaint: Follow up Extracranial Carotid Artery Stenosis   History of Present Illness  Christine Daniels is a 71 y.o. female who Dr. Scot Dock have been following since 1999 with carotid disease. Her arteries were noted to be somewhat tortuous with possibility of some fibromuscular dysplasia. She was initially referred for carotid bruit auscultated by her PCP.  Patient has not had previous CEA nor carotid artery stent.  The patient denies any history of TIA or stroke symptoms, specifically the patient denies a history of amaurosis fugax or monocular blindness, unilateral facial drooping,  hemiplegia, or receptive or expressive aphasia.  Patient denies New Medical or Surgical History.  Pt Diabetic: No Pt smoker: former smoker, quit 1979  Pt meds include: Statin : Yes Betablocker: Yes ASA: Yes Other anticoagulants/antiplatelets: no    Past Medical History:  Diagnosis Date  . Arthritis   . Carotid artery occlusion   . CEREBROVASCULAR DISEASE   . HYPERLIPIDEMIA   . Hypertension   . Migraine     Social History Social History  Substance Use Topics  . Smoking status: Former Smoker    Types: Cigarettes    Quit date: 09/10/1977  . Smokeless tobacco: Never Used  . Alcohol use 0.6 oz/week    1 Standard drinks or equivalent per week    Family History Family History  Problem Relation Age of Onset  . Cancer Mother 17    Breast  . Hyperlipidemia Father   . Stroke Father 4    Surgical History Past Surgical History:  Procedure Laterality Date  . ABDOMINAL HYSTERECTOMY  2010  . BREAST BIOPSY  1987  . Status post bilateral tubal ligation.      No Known Allergies  Current Outpatient Prescriptions  Medication Sig Dispense Refill  . aspirin 81 MG tablet Take 81 mg by mouth daily.      . cephALEXin (KEFLEX) 500 MG capsule Take 1 capsule (500 mg total) by mouth 3 (three) times daily. (Patient not taking: Reported on 10/11/2016) 21 capsule 0  . Coenzyme Q10 (CO  Q10) 100 MG TABS Take 1 tablet by mouth every other day.     Marland Kitchen KLOR-CON M20 20 MEQ tablet TAKE ONE TABLET (20MEQ) BY MOUTH EVERY OTHER DAY. 15 tablet 6  . loratadine-pseudoephedrine (CLARITIN-D 12-HOUR) 5-120 MG per tablet Take 1 tablet by mouth daily as needed.     . metoprolol succinate (TOPROL-XL) 25 MG 24 hr tablet TAKE 1 TABLET EVERY DAY 90 tablet 1  . rosuvastatin (CRESTOR) 5 MG tablet Take 1 tablet (5 mg total) by mouth every other day. 90 tablet 3  . triamterene-hydrochlorothiazide (MAXZIDE) 75-50 MG per tablet Take 1 tablet by mouth daily. 100 tablet 3  . zolmitriptan (ZOMIG) 5 MG tablet Take 1 tablet (5 mg total) by mouth as needed. (Patient taking differently: Take 5 mg by mouth as needed for migraine. ) 10 tablet 5  . zonisamide (ZONEGRAN) 100 MG capsule Take 300 mg by mouth at bedtime.     No current facility-administered medications for this visit.     Review of Systems : See HPI for pertinent positives and negatives.  Physical Examination  Vitals:   10/11/16 0827 10/11/16 0828  BP: 137/71 134/78  Pulse: 75   Resp: 18   Temp: 98.2 F (36.8 C)   TempSrc: Oral   SpO2: 99%   Weight: 146 lb 11.2 oz (66.5 kg)   Height: 5' 7.5" (1.715 m)    Body mass index is 22.64 kg/m.  General: WDWN  female in NAD GAIT: normal Eyes: PERRLA Pulmonary: Respirations are non labored, CTAB, no rales,  rhonchi, or wheezing.  Cardiac: regular rhythm, no detected murmur.  VASCULAR EXAM Carotid Bruits Left Right   Negative Negative    Radial pulses are 2+ palpable and equal.      LE Pulses LEFT RIGHT   POPLITEAL not palpable  not palpable   POSTERIOR TIBIAL  palpable   palpable    DORSALIS PEDIS  ANTERIOR TIBIAL palpable  palpable     Gastrointestinal: soft, nontender, BS WNL, no  r/g,no palpable masses.  Musculoskeletal: No muscle atrophy/wasting. M/S 5/5 throughout, Extremities without ischemic changes.  Neurologic: A&O X 3; Appropriate Affect, Speech is normal CN 2-12 intact, Pain and light touch intact in extremities, Motor exam as listed above      Assessment: Christine Daniels is a 71 y.o. female whom Dr. Scot Dock have been following since 1999 with carotid disease. Her arteries were noted to be somewhat tortuous with possibility of some fibromuscular dysplasia. She has no hx of stroke or TIA.  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 Fibromuscular dysplasia is a benign condition in which the blood vessels of the neck were found incidentally to have some minor curvatures that rarely cause any symptoms and are not associated with plaque build up. Also, this has been stable for so may years, that she does not need to follow up for another 3 years. Furthermore, her blood pressure has remained stable and within normal range.  DATA Today's carotid duplex suggests no evidence of right ICA stenosis; however, an increased velocity at the right mid ICA is noted which may be due to vessel tortuosity vs fibromuscular dysplasia. 1-39% left proximal ICA stenosis. Bilateral vertebral artery flow is antegrade (normal).  Bilateral subclavian artery waveforms are normal.  No significant change from the exam on 08-24-15.   Plan: Follow-up in 3 years with Carotid Duplex scan.   I discussed in depth with the patient the nature of atherosclerosis, and emphasized the importance of maximal medical management including strict control of blood pressure, blood glucose, and lipid levels, obtaining regular exercise, and continued cessation of smoking.  The patient is aware that without maximal  medical management the underlying atherosclerotic disease process will progress, limiting the benefit of any interventions. The patient was given information about stroke prevention and what symptoms should prompt the patient to seek immediate medical care. Thank you for allowing [place] to participate in this patient's care.  Clemon Chambers, RN, MSN, FNP-C [facility] Office: [phone]  Clinic Physician: Oneida Alar  10/11/16 9:19 AM

## 2016-10-14 ENCOUNTER — Encounter: Payer: Self-pay | Admitting: Family Medicine

## 2016-10-16 NOTE — Addendum Note (Signed)
Addended by: Lianne Cure A on: 10/16/2016 01:29 PM   Modules accepted: Orders

## 2016-10-17 ENCOUNTER — Other Ambulatory Visit: Payer: Self-pay | Admitting: Cardiology

## 2016-10-22 ENCOUNTER — Encounter: Payer: Self-pay | Admitting: Family Medicine

## 2016-10-25 ENCOUNTER — Encounter: Payer: Self-pay | Admitting: Family Medicine

## 2016-10-31 ENCOUNTER — Encounter: Payer: Self-pay | Admitting: Vascular Surgery

## 2016-10-31 NOTE — Progress Notes (Unsigned)
Dear Dr. Deborra Medina,  I have been asked to send this letter to you regarding patient Christine Daniels. (DOB: 2045-12-06).   I began following this patient in 1998 when I evaluated her with a right carotid stenosis which was asymptomatic. I last saw her in November 2012 at which time there was no significant carotid disease bilaterally. She has some mildly elevated velocities likely related to vessel tortuosity. She has been followed in our office by our nurse practitioner and was last seen on 10/11/2016. She continues to have no evidence of significant carotid stenosis but has some elevated velocities related to vessel tortuosity.   The patient is in the process of trying to get an additional small life insurance policy but reportedly the records show that she has had a previous stroke and brain surgery. We have absolutely no record of her having a stroke or having had brain surgery. I suspect that this is a diagnosis that was added through the Epic system but it would be impossible to determine where exactly this came from. Certainly we did not add this in our office and we have no record of her having had a stroke or brain surgery. We have been following her with very mild carotid disease bilaterally which is asymptomatic.  If we can provide any further information to assist with this issue I would be happy to do that.   Angelia Mould M.D. [facility].

## 2016-11-07 ENCOUNTER — Encounter: Payer: Self-pay | Admitting: Family Medicine

## 2016-11-07 ENCOUNTER — Ambulatory Visit (INDEPENDENT_AMBULATORY_CARE_PROVIDER_SITE_OTHER): Payer: Medicare Other | Admitting: Family Medicine

## 2016-11-07 VITALS — BP 134/78 | HR 78 | Temp 98.2°F | Ht 67.5 in | Wt 145.8 lb

## 2016-11-07 DIAGNOSIS — B9789 Other viral agents as the cause of diseases classified elsewhere: Secondary | ICD-10-CM

## 2016-11-07 DIAGNOSIS — J069 Acute upper respiratory infection, unspecified: Secondary | ICD-10-CM | POA: Diagnosis not present

## 2016-11-07 NOTE — Progress Notes (Signed)
Subjective:    Patient ID: Christine Daniels, female    DOB: 08-16-1946, 71 y.o.   MRN: [id number]  HPI 71 yo  Pt of Dr Hulen Shouts here with sore throat ear pain and headache with sinus pressure   Symptoms started after she was out in the yard on Saturday  Started with ST and pnd  Then a lot of sinus pressure and her teeth even hurt  All in her head Bad nasal congestion   No fever  Does not feel great  Her nasal d/c is yellow   Does not classically have allergies   Over the counter has tried claritin -not too helpful   Sunday night- a little achey   Patient Active Problem List   Diagnosis Date Noted  . Viral URI with cough 11/07/2016  . Medicare annual wellness visit, subsequent 05/31/2016  . Carotid stenosis 08/13/2012  . History of migraine headaches 02/11/2012  . HYPOKALEMIA 11/03/2009  . Essential hypertension 11/03/2009  . CEREBROVASCULAR DISEASE 11/03/2009  . HLD (hyperlipidemia) 12/30/2008   Past Medical History:  Diagnosis Date  . Arthritis   . Carotid artery occlusion   . CEREBROVASCULAR DISEASE   . HYPERLIPIDEMIA   . Hypertension   . Migraine    Past Surgical History:  Procedure Laterality Date  . ABDOMINAL HYSTERECTOMY  2010  . BREAST BIOPSY  1987  . Status post bilateral tubal ligation.     Social History  Substance Use Topics  . Smoking status: Former Smoker    Types: Cigarettes    Quit date: 09/10/1977  . Smokeless tobacco: Never Used  . Alcohol use 0.6 oz/week    1 Standard drinks or equivalent per week   Family History  Problem Relation Age of Onset  . Cancer Mother 85    Breast  . Hyperlipidemia Father   . Stroke Father 66   No Known Allergies Current Outpatient Prescriptions on File Prior to Visit  Medication Sig Dispense Refill  . aspirin 81 MG tablet Take 81 mg by mouth daily.      . Coenzyme Q10 (CO Q10) 100 MG TABS Take 1 tablet by mouth every other day.     Marland Kitchen KLOR-CON M20 20 MEQ tablet TAKE ONE TABLET (20MEQ) BY MOUTH EVERY OTHER  DAY. 15 tablet 6  . loratadine-pseudoephedrine (CLARITIN-D 12-HOUR) 5-120 MG per tablet Take 1 tablet by mouth daily as needed.     . metoprolol succinate (TOPROL-XL) 25 MG 24 hr tablet TAKE 1 TABLET EVERY DAY 90 tablet 1  . rosuvastatin (CRESTOR) 5 MG tablet Take 1 tablet (5 mg total) by mouth every other day. 90 tablet 3  . triamterene-hydrochlorothiazide (MAXZIDE) 75-50 MG per tablet Take 1 tablet by mouth daily. 100 tablet 3  . zolmitriptan (ZOMIG) 5 MG tablet Take 1 tablet (5 mg total) by mouth as needed. (Patient taking differently: Take 5 mg by mouth as needed for migraine. ) 10 tablet 5  . zonisamide (ZONEGRAN) 100 MG capsule Take 300 mg by mouth at bedtime.     No current facility-administered medications on file prior to visit.     Review of Systems  Constitutional: Positive for appetite change and fatigue. Negative for fever.  HENT: Positive for congestion, postnasal drip, rhinorrhea, sinus pressure, sneezing and sore throat. Negative for ear pain.   Eyes: Negative for pain and discharge.  Respiratory: Positive for cough. Negative for shortness of breath, wheezing and stridor.   Cardiovascular: Negative for chest pain.  Gastrointestinal: Negative for diarrhea, nausea and  vomiting.  Genitourinary: Negative for frequency, hematuria and urgency.  Musculoskeletal: Negative for arthralgias and myalgias.  Skin: Negative for rash.  Neurological: Positive for headaches. Negative for dizziness, weakness and light-headedness.  Psychiatric/Behavioral: Negative for confusion and dysphoric mood.       Objective:   Physical Exam  Constitutional: She appears well-developed and well-nourished. No distress.  HENT:  Head: Normocephalic and atraumatic.  Right Ear: External ear normal.  Left Ear: External ear normal.  Mouth/Throat: Oropharynx is clear and moist.  Nares are injected and congested  Mild maxillary sinus tenderness Clear rhinorrhea and post nasal drip   Eyes: Conjunctivae and  EOM are normal. Pupils are equal, round, and reactive to light. Right eye exhibits no discharge. Left eye exhibits no discharge.  Neck: Normal range of motion. Neck supple.  Cardiovascular: Normal rate and normal heart sounds.   Pulmonary/Chest: Effort normal and breath sounds normal. No respiratory distress. She has no wheezes. She has no rales. She exhibits no tenderness.  No rales or rhonchi  Good air exch   Lymphadenopathy:    She has no cervical adenopathy.  Neurological: She is alert.  Skin: Skin is warm and dry. No rash noted.  Psychiatric: She has a normal mood and affect.          Assessment & Plan:   Problem List Items Addressed This Visit      Respiratory   Viral URI with cough    Re assuring exam Disc symptomatic care - see instructions on AVS Update if not starting to improve in a week or if worsening

## 2016-11-07 NOTE — Patient Instructions (Addendum)
For congestion I recommend sudafed PE (store brand is fine)  Nasal saline drops or spray  If you tolerate nasal spray - flonase is ok to try  Breathe steam  Warm compresses on face  Ibuprofen is ok for congestion and body aches and sinus pain  To loosen mucous in head and chest - mucinex  Watch for fever  Extra rest and fluids  Update if not starting to improve in a week or if worsening   Upper Respiratory Infection, Adult Most upper respiratory infections (URIs) are a viral infection of the air passages leading to the lungs. A URI affects the nose, throat, and upper air passages. The most common type of URI is nasopharyngitis and is typically referred to as "the common cold." URIs run their course and usually go away on their own. Most of the time, a URI does not require medical attention, but sometimes a bacterial infection in the upper airways can follow a viral infection. This is called a secondary infection. Sinus and middle ear infections are common types of secondary upper respiratory infections. Bacterial pneumonia can also complicate a URI. A URI can worsen asthma and chronic obstructive pulmonary disease (COPD). Sometimes, these complications can require emergency medical care and may be life threatening. What are the causes? Almost all URIs are caused by viruses. A virus is a type of germ and can spread from one person to another. What increases the risk? You may be at risk for a URI if:  You smoke.  You have chronic heart or lung disease.  You have a weakened defense (immune) system.  You are very young or very old.  You have nasal allergies or asthma.  You work in crowded or poorly ventilated areas.  You work in health care facilities or schools. What are the signs or symptoms? Symptoms typically develop 2-3 days after you come in contact with a cold virus. Most viral URIs last 7-10 days. However, viral URIs from the influenza virus (flu virus) can last 14-18 days and  are typically more severe. Symptoms may include:  Runny or stuffy (congested) nose.  Sneezing.  Cough.  Sore throat.  Headache.  Fatigue.  Fever.  Loss of appetite.  Pain in your forehead, behind your eyes, and over your cheekbones (sinus pain).  Muscle aches. How is this diagnosed? Your health care provider may diagnose a URI by:  Physical exam.  Tests to check that your symptoms are not due to another condition such as:  Strep throat.  Sinusitis.  Pneumonia.  Asthma. How is this treated? A URI goes away on its own with time. It cannot be cured with medicines, but medicines may be prescribed or recommended to relieve symptoms. Medicines may help:  Reduce your fever.  Reduce your cough.  Relieve nasal congestion. Follow these instructions at home:  Take medicines only as directed by your health care provider.  Gargle warm saltwater or take cough drops to comfort your throat as directed by your health care provider.  Use a warm mist humidifier or inhale steam from a shower to increase air moisture. This may make it easier to breathe.  Drink enough fluid to keep your urine clear or pale yellow.  Eat soups and other clear broths and maintain good nutrition.  Rest as needed.  Return to work when your temperature has returned to normal or as your health care provider advises. You may need to stay home longer to avoid infecting others. You can also use a face mask  and careful hand washing to prevent spread of the virus.  Increase the usage of your inhaler if you have asthma.  Do not use any tobacco products, including cigarettes, chewing tobacco, or electronic cigarettes. If you need help quitting, ask your health care provider. How is this prevented? The best way to protect yourself from getting a cold is to practice good hygiene.  Avoid oral or hand contact with people with cold symptoms.  Wash your hands often if contact occurs. There is no clear  evidence that vitamin C, vitamin E, echinacea, or exercise reduces the chance of developing a cold. However, it is always recommended to get plenty of rest, exercise, and practice good nutrition. Contact a health care provider if:  You are getting worse rather than better.  Your symptoms are not controlled by medicine.  You have chills.  You have worsening shortness of breath.  You have brown or red mucus.  You have yellow or brown nasal discharge.  You have pain in your face, especially when you bend forward.  You have a fever.  You have swollen neck glands.  You have pain while swallowing.  You have white areas in the back of your throat. Get help right away if:  You have severe or persistent:  Headache.  Ear pain.  Sinus pain.  Chest pain.  You have chronic lung disease and any of the following:  Wheezing.  Prolonged cough.  Coughing up blood.  A change in your usual mucus.  You have a stiff neck.  You have changes in your:  Vision.  Hearing.  Thinking.  Mood. This information is not intended to replace advice given to you by your health care provider. Make sure you discuss any questions you have with your health care provider. Document Released: 02/20/2001 Document Revised: 04/29/2016 Document Reviewed: 12/02/2013 Elsevier Interactive Patient Education  2017 [employer].

## 2016-11-07 NOTE — Progress Notes (Signed)
Pre visit review using our clinic review tool, if applicable. No additional management support is needed unless otherwise documented below in the visit note. 

## 2016-11-08 NOTE — Assessment & Plan Note (Signed)
Reassuring exam Disc symptomatic care - see instructions on AVS Update if not starting to improve in a week or if worsening   

## 2016-11-12 ENCOUNTER — Encounter: Payer: Self-pay | Admitting: Family Medicine

## 2016-11-12 MED ORDER — AMOXICILLIN-POT CLAVULANATE 875-125 MG PO TABS: 1.0000 | ORAL_TABLET | Freq: Two times a day (BID) | ORAL | 0 refills | Status: DC

## 2016-11-22 ENCOUNTER — Other Ambulatory Visit: Payer: Self-pay | Admitting: Family Medicine

## 2016-11-28 ENCOUNTER — Other Ambulatory Visit: Payer: Self-pay | Admitting: Family Medicine

## 2016-11-28 ENCOUNTER — Encounter: Payer: Self-pay | Admitting: Family Medicine

## 2016-11-28 NOTE — Telephone Encounter (Signed)
Left voicemail requesting pt to call the office back 

## 2016-11-28 NOTE — Telephone Encounter (Signed)
Please ask her if the symptoms are the same or worsening?  Refill for 5 more days If not resolved please f/u with pcp  Thanks

## 2016-11-29 MED ORDER — AMOXICILLIN-POT CLAVULANATE 875-125 MG PO TABS: 1.0000 | ORAL_TABLET | Freq: Two times a day (BID) | ORAL | 0 refills | Status: DC

## 2017-01-17 DIAGNOSIS — M5136 Other intervertebral disc degeneration, lumbar region: Secondary | ICD-10-CM | POA: Diagnosis not present

## 2017-01-17 DIAGNOSIS — M542 Cervicalgia: Secondary | ICD-10-CM | POA: Diagnosis not present

## 2017-01-17 DIAGNOSIS — G8929 Other chronic pain: Secondary | ICD-10-CM | POA: Diagnosis not present

## 2017-02-25 DIAGNOSIS — Z1231 Encounter for screening mammogram for malignant neoplasm of breast: Secondary | ICD-10-CM | POA: Diagnosis not present

## 2017-02-25 DIAGNOSIS — Z6821 Body mass index (BMI) 21.0-21.9, adult: Secondary | ICD-10-CM | POA: Diagnosis not present

## 2017-02-25 DIAGNOSIS — Z01419 Encounter for gynecological examination (general) (routine) without abnormal findings: Secondary | ICD-10-CM | POA: Diagnosis not present

## 2017-03-12 DIAGNOSIS — G43019 Migraine without aura, intractable, without status migrainosus: Secondary | ICD-10-CM | POA: Diagnosis not present

## 2017-03-12 DIAGNOSIS — G43719 Chronic migraine without aura, intractable, without status migrainosus: Secondary | ICD-10-CM | POA: Diagnosis not present

## 2017-03-18 NOTE — Progress Notes (Signed)
HPI: FU hypertension; also with cerebrovascular disease followed by vascular surgery. Since last seen, the patient denies any dyspnea on exertion, orthopnea, PND, pedal edema, palpitations, syncope or chest pain.   Current Outpatient Prescriptions  Medication Sig Dispense Refill  . aspirin 81 MG tablet Take 81 mg by mouth daily.      . Coenzyme Q10 (CO Q10) 100 MG TABS Take 1 tablet by mouth every other day.     Marland Kitchen KLOR-CON M20 20 MEQ tablet TAKE ONE TABLET (20MEQ) BY MOUTH EVERY OTHER DAY. 15 tablet 6  . loratadine-pseudoephedrine (CLARITIN-D 12-HOUR) 5-120 MG per tablet Take 1 tablet by mouth daily as needed.     . metoprolol succinate (TOPROL-XL) 25 MG 24 hr tablet TAKE 1 TABLET EVERY DAY 90 tablet 1  . rosuvastatin (CRESTOR) 5 MG tablet Take 1 tablet (5 mg total) by mouth every other day. 90 tablet 3  . triamterene-hydrochlorothiazide (MAXZIDE) 75-50 MG per tablet Take 1 tablet by mouth daily. 100 tablet 3  . zolmitriptan (ZOMIG) 5 MG tablet Take 1 tablet (5 mg total) by mouth as needed. (Patient taking differently: Take 5 mg by mouth as needed for migraine. ) 10 tablet 5  . zonisamide (ZONEGRAN) 100 MG capsule Take 300 mg by mouth at bedtime.     No current facility-administered medications for this visit.      Past Medical History:  Diagnosis Date  . Arthritis   . Carotid artery occlusion   . CEREBROVASCULAR DISEASE   . HYPERLIPIDEMIA   . Hypertension   . Migraine     Past Surgical History:  Procedure Laterality Date  . ABDOMINAL HYSTERECTOMY  2010  . BREAST BIOPSY  1987  . Status post bilateral tubal ligation.      Social History   Social History  . Marital status: Married    Spouse name: N/A  . Number of children: N/A  . Years of education: N/A   Occupational History  . Not on file.   Social History Main Topics  . Smoking status: Former Smoker    Types: Cigarettes    Quit date: 09/10/1977  . Smokeless tobacco: Never Used  . Alcohol use 0.6 oz/week    1 Standard drinks or equivalent per week  . Drug use: No  . Sexual activity: Yes   Other Topics Concern  . Not on file   Social History Narrative   She has remote history of minimal tobacco use, but has     not smoked in years.  She rarely consumes alcohol.    Desires CPR    Family History  Problem Relation Age of Onset  . Cancer Mother 21       Breast  . Hyperlipidemia Father   . Stroke Father 9    ROS: no fevers or chills, productive cough, hemoptysis, dysphasia, odynophagia, melena, hematochezia, dysuria, hematuria, rash, seizure activity, orthopnea, PND, pedal edema, claudication. Remaining systems are negative.  Physical Exam: Well-developed well-nourished in no acute distress.  Skin is warm and dry.  HEENT is normal.  Neck is supple.  Chest is clear to auscultation with normal expansion.  Cardiovascular exam is regular rate and rhythm.  Abdominal exam nontender or distended. No masses palpated. Extremities show no edema. neuro grossly intact  ECG- Sinus rhythm at a rate of 66. No ST changes. personally reviewed  A/P  1 Hypertension-blood pressure is elevated. Increase Toprol to 50 mg daily and follow.  2 hyperlipidemia-continue statin. Lipids and liver monitored by  primary care.  3 carotid artery disease-continue aspirin and statin. Followed by vascular surgery.  Kirk Ruths, MD

## 2017-03-28 ENCOUNTER — Ambulatory Visit (INDEPENDENT_AMBULATORY_CARE_PROVIDER_SITE_OTHER): Payer: Medicare Other | Admitting: Cardiology

## 2017-03-28 ENCOUNTER — Encounter: Payer: Self-pay | Admitting: Cardiology

## 2017-03-28 ENCOUNTER — Other Ambulatory Visit: Payer: Self-pay | Admitting: Cardiology

## 2017-03-28 VITALS — BP 138/86 | HR 66 | Ht 69.0 in | Wt 146.0 lb

## 2017-03-28 DIAGNOSIS — I10 Essential (primary) hypertension: Secondary | ICD-10-CM

## 2017-03-28 DIAGNOSIS — I739 Peripheral vascular disease, unspecified: Secondary | ICD-10-CM

## 2017-03-28 DIAGNOSIS — I779 Disorder of arteries and arterioles, unspecified: Secondary | ICD-10-CM

## 2017-03-28 DIAGNOSIS — E7800 Pure hypercholesterolemia, unspecified: Secondary | ICD-10-CM

## 2017-03-28 DIAGNOSIS — H524 Presbyopia: Secondary | ICD-10-CM | POA: Diagnosis not present

## 2017-03-28 MED ORDER — METOPROLOL SUCCINATE ER 50 MG PO TB24: 50.0000 mg | ORAL_TABLET | Freq: Every day | ORAL | 3 refills | Status: DC

## 2017-03-28 NOTE — Patient Instructions (Signed)
Medication Instructions:   INCREASE METOPROLOL TO 50 MG ONCE DAILY= 2 OF THE 25 MG TABLETS ONCE DAILY  Follow-Up:  Your physician wants you to follow-up in: ONE YEAR WITH DR CRENSHAW You will receive a reminder letter in the mail two months in advance. If you don't receive a letter, please call our office to schedule the follow-up appointment.   If you need a refill on your cardiac medications before your next appointment, please call your pharmacy.    

## 2017-04-19 ENCOUNTER — Telehealth: Payer: Self-pay | Admitting: Cardiology

## 2017-04-19 NOTE — Telephone Encounter (Signed)
The patient called in to state that she has been having headaches and overall feelings of fatigue which she attributes to Metoprolol. She stated that she felt fine on the 25 mg but when it was increased to 50 mg she started having symptoms. It was recently increased at her visit on 7/19. She stated that her blood pressure has been in the 120/70's (she did not have any accurate numbers). Will route to the provider for his recommendation.

## 2017-04-19 NOTE — Telephone Encounter (Signed)
Left message to call back  

## 2017-04-19 NOTE — Telephone Encounter (Signed)
Follow up ° ° ° ° ° °Returning a call to the nurse °

## 2017-04-19 NOTE — Telephone Encounter (Signed)
Change toprol to 25 mg daily and follow bp Kirk Ruths

## 2017-04-19 NOTE — Telephone Encounter (Signed)
Patient has made aware to decrease her Toprol XL to 25 mg tablet daily and to keep a log of her blood pressures. She has been instructed to call back if her symptoms do not get any better. She verbalized her understanding.

## 2017-04-19 NOTE — Telephone Encounter (Signed)
Christine Daniels is calling to find out what can she do about the Side Effects in which she is experiencing with the Metoprolol 50mg . Please call

## 2017-05-13 ENCOUNTER — Encounter: Payer: Self-pay | Admitting: Family Medicine

## 2017-05-27 ENCOUNTER — Other Ambulatory Visit: Payer: Self-pay | Admitting: Cardiology

## 2017-05-27 NOTE — Telephone Encounter (Signed)
Rx(s) sent to pharmacy electronically.  

## 2017-05-28 ENCOUNTER — Encounter: Payer: Self-pay | Admitting: Family Medicine

## 2017-05-30 DIAGNOSIS — M542 Cervicalgia: Secondary | ICD-10-CM | POA: Diagnosis not present

## 2017-06-05 ENCOUNTER — Ambulatory Visit (INDEPENDENT_AMBULATORY_CARE_PROVIDER_SITE_OTHER): Payer: Medicare Other | Admitting: Family Medicine

## 2017-06-05 VITALS — BP 130/80 | HR 67 | Ht 69.0 in | Wt 145.0 lb

## 2017-06-05 DIAGNOSIS — I10 Essential (primary) hypertension: Secondary | ICD-10-CM

## 2017-06-05 DIAGNOSIS — Z23 Encounter for immunization: Secondary | ICD-10-CM | POA: Diagnosis not present

## 2017-06-05 DIAGNOSIS — E785 Hyperlipidemia, unspecified: Secondary | ICD-10-CM

## 2017-06-05 LAB — LIPID PANEL
CHOL/HDL RATIO: 4
Cholesterol: 152 mg/dL (ref 0–200)
HDL: 42.4 mg/dL (ref >39.00)
LDL CALC: 87 mg/dL (ref 0–99)
NonHDL: 109.44
Triglycerides: 110 mg/dL (ref 0.0–149.0)
VLDL: 22 mg/dL (ref 0.0–40.0)

## 2017-06-05 LAB — COMPREHENSIVE METABOLIC PANEL
ALT: 18 U/L (ref 0–35)
AST: 21 U/L (ref 0–37)
Albumin: 4.3 g/dL (ref 3.5–5.2)
Alkaline Phosphatase: 63 U/L (ref 39–117)
BUN: 23 mg/dL (ref 6–23)
CHLORIDE: 98 meq/L (ref 96–112)
CO2: 33 mEq/L — ABNORMAL HIGH (ref 19–32)
Calcium: 9.7 mg/dL (ref 8.4–10.5)
Creatinine, Ser: 0.97 mg/dL (ref 0.40–1.20)
GFR: 60.27 mL/min (ref >60.00)
GLUCOSE: 111 mg/dL — AB (ref 70–99)
POTASSIUM: 3.2 meq/L — AB (ref 3.5–5.1)
SODIUM: 139 meq/L (ref 135–145)
Total Bilirubin: 0.9 mg/dL (ref 0.2–1.2)
Total Protein: 6.5 g/dL (ref 6.0–8.3)

## 2017-06-05 NOTE — Progress Notes (Signed)
   Subjective:   Patient ID: Christine Daniels, female    DOB: Jul 03, 1946, 71 y.o.   MRN: [id number]  Christine Daniels is a pleasant 71 y.o. year old female who presents to clinic today with Follow-up  on 06/05/2017  HPI:  Saw Dr. Stanford Breed on 03/29/17. Note reviewed. BP was elevated so Toprol increased to 50 mg daily but her ankles started swelling so she decreased back to 25 mg daily. Advised to continue ASA and statin.  Has been normotensive at home.  She thinks she was just busy running around that day. Lab Results  Component Value Date   CHOL 222 (H) 03/22/2016   HDL 43.60 03/22/2016   LDLCALC 150 (H) 03/22/2016   LDLDIRECT 166.0 05/31/2016   TRIG 142.0 03/22/2016   CHOLHDL 5 03/22/2016   Lab Results  Component Value Date   ALT 19 05/31/2016   AST 22 05/31/2016   ALKPHOS 66 05/31/2016   BILITOT 0.6 05/31/2016      Review of Systems  Constitutional: Negative.   HENT: Negative.   Eyes: Negative.   Respiratory: Negative.   Cardiovascular: Negative.   Gastrointestinal: Negative.   Endocrine: Negative.   Genitourinary: Negative.   Musculoskeletal: Negative.   Allergic/Immunologic: Negative.   Neurological: Negative.   Hematological: Negative.   Psychiatric/Behavioral: Negative.   All other systems reviewed and are negative.      Objective:    BP 130/80   Pulse 67   Ht 5\' 9"  (1.753 m)   Wt 145 lb (65.8 kg)   SpO2 97%   BMI 21.41 kg/m    Physical Exam   General:  Well-developed,well-nourished,in no acute distress; alert,appropriate and cooperative throughout examination Head:  normocephalic and atraumatic.   Eyes:  vision grossly intact, PERRL Ears:  R ear normal and L ear normal externally, TMs clear bilaterally Nose:  no external deformity.   Mouth:  good dentition.   Neck:  No deformities, masses, or tenderness noted. Lungs:  Normal respiratory effort, chest expands symmetrically. Lungs are clear to auscultation, no crackles or wheezes. Heart:   Normal rate and regular rhythm. S1 and S2 normal without gallop, murmur, click, rub or other extra sounds. Abdomen:  Bowel sounds positive,abdomen soft and non-tender without masses, organomegaly or hernias noted Msk:  No deformity or scoliosis noted of thoracic or lumbar spine.   Extremities:  No clubbing, cyanosis, edema, or deformity noted with normal full range of motion of all joints.   Neurologic:  alert & oriented X3 and gait normal.   Skin:  Intact without suspicious lesions or rashes Psych:  Cognition and judgment appear intact. Alert and cooperative with normal attention span and concentration. No apparent delusions, illusions, hallucinations       Assessment & Plan:   Essential hypertension  Hyperlipidemia, unspecified hyperlipidemia type No Follow-up on file.

## 2017-06-05 NOTE — Assessment & Plan Note (Signed)
Continue current rx. Check lipid panel and CMET today.

## 2017-06-05 NOTE — Assessment & Plan Note (Signed)
Well controlled with lower dose toprol. No changes made today.

## 2017-06-10 ENCOUNTER — Encounter: Payer: Self-pay | Admitting: Family Medicine

## 2017-07-08 ENCOUNTER — Ambulatory Visit (INDEPENDENT_AMBULATORY_CARE_PROVIDER_SITE_OTHER): Payer: Medicare Other | Admitting: Family Medicine

## 2017-07-08 ENCOUNTER — Encounter: Payer: Self-pay | Admitting: Family Medicine

## 2017-07-08 VITALS — BP 122/86 | HR 79 | Temp 98.2°F | Ht 69.0 in | Wt 145.1 lb

## 2017-07-08 DIAGNOSIS — R300 Dysuria: Secondary | ICD-10-CM | POA: Diagnosis not present

## 2017-07-08 LAB — POCT URINALYSIS DIPSTICK
Bilirubin, UA: NEGATIVE
GLUCOSE UA: NEGATIVE
Ketones, UA: NEGATIVE
NITRITE UA: NEGATIVE
Protein, UA: 15
RBC UA: NEGATIVE
Spec Grav, UA: 1.015 (ref 1.010–1.025)
UROBILINOGEN UA: 0.2 U/dL
pH, UA: 6 (ref 5.0–8.0)

## 2017-07-08 NOTE — Progress Notes (Signed)
SUBJECTIVE: Christine Daniels is a 71 y.o. female who complains of urinary frequency, urgency and dysuria x 5 days, without flank pain, fever, chills, or abnormal vaginal discharge or bleeding.   Current Outpatient Prescriptions on File Prior to Visit  Medication Sig Dispense Refill  . aspirin 81 MG tablet Take 81 mg by mouth daily.      . Coenzyme Q10 (CO Q10) 100 MG TABS Take 1 tablet by mouth every other day.     Marland Kitchen KLOR-CON M20 20 MEQ tablet TAKE ONE TABLET (20MEQ) BY MOUTH EVERY OTHER DAY. 15 tablet 6  . loratadine-pseudoephedrine (CLARITIN-D 12-HOUR) 5-120 MG per tablet Take 1 tablet by mouth daily as needed.     . metoprolol succinate (TOPROL-XL) 25 MG 24 hr tablet TAKE 1 TABLET EVERY DAY 90 tablet 3  . rosuvastatin (CRESTOR) 5 MG tablet TAKE 1 TABLET BY MOUTH EVERY OTHER DAY 90 tablet 3  . triamterene-hydrochlorothiazide (MAXZIDE) 75-50 MG per tablet Take 1 tablet by mouth daily. 100 tablet 3  . zolmitriptan (ZOMIG) 5 MG tablet Take 1 tablet (5 mg total) by mouth as needed. (Patient taking differently: Take 5 mg by mouth as needed for migraine. ) 10 tablet 5  . zonisamide (ZONEGRAN) 100 MG capsule Take 300 mg by mouth at bedtime.     No current facility-administered medications on file prior to visit.     No Known Allergies  Past Medical History:  Diagnosis Date  . Arthritis   . Carotid artery occlusion   . CEREBROVASCULAR DISEASE   . HYPERLIPIDEMIA   . Hypertension   . Migraine     Past Surgical History:  Procedure Laterality Date  . ABDOMINAL HYSTERECTOMY  2010  . BREAST BIOPSY  1987  . Status post bilateral tubal ligation.      Family History  Problem Relation Age of Onset  . Cancer Mother 19       Breast  . Hyperlipidemia Father   . Stroke Father 55    Social History   Social History  . Marital status: Married    Spouse name: N/A  . Number of children: N/A  . Years of education: N/A   Occupational History  . Not on file.   Social History Main Topics  .  Smoking status: Former Smoker    Types: Cigarettes    Quit date: 09/10/1977  . Smokeless tobacco: Never Used  . Alcohol use 0.6 oz/week    1 Standard drinks or equivalent per week  . Drug use: No  . Sexual activity: Yes   Other Topics Concern  . Not on file   Social History Narrative   She has remote history of minimal tobacco use, but has     not smoked in years.  She rarely consumes alcohol.    Desires CPR   The PMH, PSH, Social History, Family History, Medications, and allergies have been reviewed in [facility], and have been updated if relevant.  OBJECTIVE: BP 122/86 (BP Location: Right Arm, Patient Position: Sitting, Cuff Size: Normal)   Pulse 79   Temp 98.2 F (36.8 C) (Oral)   Ht 5\' 9"  (1.753 m)   Wt 145 lb 1.9 oz (65.8 kg)   SpO2 100%   BMI 21.43 kg/m    Appears well, in no apparent distress.  Vital signs are normal. The abdomen is soft without tenderness, guarding, mass, rebound or organomegaly. No CVA tenderness or inguinal adenopathy noted. Urine dipstick shows positive for WBC's.    ASSESSMENT: ? Resolving UTI.  She is feeling better today, only leukocytes on UA.  PLAN: Treatment per orders -send for cx, no abx today, also push fluids, may use Pyridium OTC prn. Call or return to clinic prn if these symptoms worsen or fail to improve as anticipated.

## 2017-07-08 NOTE — Progress Notes (Signed)
Prepared for C&S per TA/thx dmf

## 2017-07-09 ENCOUNTER — Encounter: Payer: Self-pay | Admitting: Family Medicine

## 2017-07-10 ENCOUNTER — Other Ambulatory Visit: Payer: Self-pay | Admitting: Family Medicine

## 2017-07-10 MED ORDER — CIPROFLOXACIN HCL 250 MG PO TABS: 250.0000 mg | ORAL_TABLET | Freq: Two times a day (BID) | ORAL | 0 refills | Status: DC

## 2017-07-11 LAB — URINE CULTURE
MICRO NUMBER:: 81209335
SPECIMEN QUALITY:: ADEQUATE

## 2017-07-22 DIAGNOSIS — M50822 Other cervical disc disorders at C5-C6 level: Secondary | ICD-10-CM | POA: Diagnosis not present

## 2017-07-22 DIAGNOSIS — M25511 Pain in right shoulder: Secondary | ICD-10-CM | POA: Diagnosis not present

## 2017-09-20 ENCOUNTER — Other Ambulatory Visit: Payer: Self-pay | Admitting: Cardiology

## 2017-10-11 DIAGNOSIS — M5030 Other cervical disc degeneration, unspecified cervical region: Secondary | ICD-10-CM | POA: Insufficient documentation

## 2017-10-16 DIAGNOSIS — G43719 Chronic migraine without aura, intractable, without status migrainosus: Secondary | ICD-10-CM | POA: Diagnosis not present

## 2017-10-16 DIAGNOSIS — G43019 Migraine without aura, intractable, without status migrainosus: Secondary | ICD-10-CM | POA: Diagnosis not present

## 2017-10-25 DIAGNOSIS — M25511 Pain in right shoulder: Secondary | ICD-10-CM | POA: Diagnosis not present

## 2017-11-04 DIAGNOSIS — M5136 Other intervertebral disc degeneration, lumbar region: Secondary | ICD-10-CM | POA: Insufficient documentation

## 2018-01-20 ENCOUNTER — Other Ambulatory Visit: Payer: Self-pay | Admitting: Cardiology

## 2018-01-20 NOTE — Telephone Encounter (Signed)
Rx request sent to pharmacy.  

## 2018-01-28 DIAGNOSIS — M5030 Other cervical disc degeneration, unspecified cervical region: Secondary | ICD-10-CM | POA: Diagnosis not present

## 2018-02-12 NOTE — Progress Notes (Deleted)
Subjective:   Christine Daniels is a 72 y.o. female who presents for Medicare Annual (Subsequent) preventive examination.  Review of Systems: No ROS.  Medicare Wellness Visit. Additional risk factors are reflected in the social history.   Sleep patterns:    Home Safety/Smoke Alarms: Feels safe in home. Smoke alarms in place.  Living environment; residence and Firearm Safety:    Female:         Mammo-       Dexa scan- utd       CCS-     Objective:     Vitals: There were no vitals taken for this visit.  There is no height or weight on file to calculate BMI.  Advanced Directives 08/24/2015 08/25/2014  Does Patient Have a Medical Advance Directive? Yes Yes  Type of Paramedic of [place];Living will Living will;Healthcare Power of Attorney  Does patient want to make changes to medical advance directive? - No - Patient declined  Copy of [place] in Chart? - No - copy requested    Tobacco Social History   Tobacco Use  Smoking Status Former Smoker  . Types: Cigarettes  . Last attempt to quit: 09/10/1977  . Years since quitting: 40.4  Smokeless Tobacco Never Used     Counseling given: Not Answered   Clinical Intake:                       Past Medical History:  Diagnosis Date  . Arthritis   . Carotid artery occlusion   . CEREBROVASCULAR DISEASE   . HYPERLIPIDEMIA   . Hypertension   . Migraine    Past Surgical History:  Procedure Laterality Date  . ABDOMINAL HYSTERECTOMY  2010  . BREAST BIOPSY  1987  . Status post bilateral tubal ligation.     Family History  Problem Relation Age of Onset  . Cancer Mother 27       Breast  . Hyperlipidemia Father   . Stroke Father 8   Social History   Socioeconomic History  . Marital status: Married    Spouse name: Not on file  . Number of children: Not on file  . Years of education: Not on file  . Highest education level: Not on file  Occupational History  . Not  on file  Social Needs  . Financial resource strain: Not on file  . Food insecurity:    Worry: Not on file    Inability: Not on file  . Transportation needs:    Medical: Not on file    Non-medical: Not on file  Tobacco Use  . Smoking status: Former Smoker    Types: Cigarettes    Last attempt to quit: 09/10/1977    Years since quitting: 40.4  . Smokeless tobacco: Never Used  Substance and Sexual Activity  . Alcohol use: Yes    Alcohol/week: 0.6 oz    Types: 1 Standard drinks or equivalent per week  . Drug use: No  . Sexual activity: Yes  Lifestyle  . Physical activity:    Days per week: Not on file    Minutes per session: Not on file  . Stress: Not on file  Relationships  . Social connections:    Talks on phone: Not on file    Gets together: Not on file    Attends religious service: Not on file    Active member of club or organization: Not on file    Attends  meetings of clubs or organizations: Not on file    Relationship status: Not on file  Other Topics Concern  . Not on file  Social History Narrative   She has remote history of minimal tobacco use, but has     not smoked in years.  She rarely consumes alcohol.    Desires CPR    Outpatient Encounter Medications as of 02/19/2018  Medication Sig  . aspirin 81 MG tablet Take 81 mg by mouth daily.    . ciprofloxacin (CIPRO) 250 MG tablet Take 1 tablet (250 mg total) by mouth 2 (two) times daily.  . Coenzyme Q10 (CO Q10) 100 MG TABS Take 1 tablet by mouth every other day.   . loratadine-pseudoephedrine (CLARITIN-D 12-HOUR) 5-120 MG per tablet Take 1 tablet by mouth daily as needed.   . metoprolol succinate (TOPROL-XL) 25 MG 24 hr tablet TAKE 1 TABLET EVERY DAY  . potassium chloride SA (KLOR-CON M20) 20 MEQ tablet Take 1 tablet (20 mEq total) by mouth every other day.  . rosuvastatin (CRESTOR) 5 MG tablet TAKE 1 TABLET BY MOUTH EVERY OTHER DAY  . triamterene-hydrochlorothiazide (MAXZIDE) 75-50 MG per tablet Take 1 tablet by  mouth daily.  Marland Kitchen zolmitriptan (ZOMIG) 5 MG tablet Take 1 tablet (5 mg total) by mouth as needed. (Patient taking differently: Take 5 mg by mouth as needed for migraine. )  . zonisamide (ZONEGRAN) 100 MG capsule Take 300 mg by mouth at bedtime.   No facility-administered encounter medications on file as of 02/19/2018.     Activities of Daily Living In your present state of health, do you have any difficulty performing the following activities: 07/08/2017  Hearing? N  Vision? N  Difficulty concentrating or making decisions? N  Walking or climbing stairs? N  Dressing or bathing? N  Doing errands, shopping? N  Some recent data might be hidden    Patient Care Team: Lucille Passy, MD as PCP - General (Family Medicine) Orie Rout, MD as Referring Physician (Specialist) Suella Broad, MD (Physical Medicine and Rehabilitation) Stanford Breed, Denice Bors, MD as Consulting Physician (Cardiology) Angelia Mould, MD as Consulting Physician (Vascular Surgery) Vania Rea, MD as Consulting Physician (Obstetrics and Gynecology) Levy Sjogren, MD as Referring Physician (Dermatology)    Assessment:   This is a routine wellness examination for Christine Daniels. nocpe  Exercise Activities and Dietary recommendations   Diet (meal preparation, eat out, water intake, caffeinated beverages, dairy products, fruits and vegetables): {Desc; diets:16563} Breakfast: Lunch:  Dinner:      Goals    None      Fall Risk Fall Risk  07/08/2017 05/31/2016  Falls in the past year? No No    Depression Screen PHQ 2/9 Scores 07/08/2017 05/31/2016  PHQ - 2 Score 0 0     Cognitive Function        Immunization History  Administered Date(s) Administered  . Influenza,inj,Quad PF,6+ Mos 06/05/2013, 06/21/2014, 05/31/2016, 06/05/2017  . Pneumococcal Conjugate-13 07/27/2014  . Pneumococcal Polysaccharide-23 06/05/2013  . Tdap 02/11/2012   Screening Tests Health Maintenance  Topic Date Due  .  COLONOSCOPY  12/25/1996  . MAMMOGRAM  01/24/2017  . INFLUENZA VACCINE  04/10/2018  . TETANUS/TDAP  02/10/2022  . DEXA SCAN  Completed  . Hepatitis C Screening  Completed  . PNA vac Low Risk Adult  Completed       Plan:   ***   I have personally reviewed and noted the following in the patient's chart:   . Medical and  social history . Use of alcohol, tobacco or illicit drugs  . Current medications and supplements . Functional ability and status . Nutritional status . Physical activity . Advanced directives . List of other physicians . Hospitalizations, surgeries, and ER visits in previous 12 months . Vitals . Screenings to include cognitive, depression, and falls . Referrals and appointments  In addition, I have reviewed and discussed with patient certain preventive protocols, quality metrics, and best practice recommendations. A written personalized care plan for preventive services as well as general preventive health recommendations were provided to patient.     Shela Nevin, [place]  02/12/2018

## 2018-02-19 ENCOUNTER — Encounter: Payer: Medicare Other | Admitting: Behavioral Health

## 2018-02-19 ENCOUNTER — Ambulatory Visit (INDEPENDENT_AMBULATORY_CARE_PROVIDER_SITE_OTHER): Payer: Medicare Other | Admitting: Family Medicine

## 2018-02-19 ENCOUNTER — Encounter: Payer: Self-pay | Admitting: Family Medicine

## 2018-02-19 VITALS — BP 110/60 | HR 70 | Temp 97.8°F | Ht 69.0 in | Wt 143.6 lb

## 2018-02-19 DIAGNOSIS — Z8669 Personal history of other diseases of the nervous system and sense organs: Secondary | ICD-10-CM

## 2018-02-19 DIAGNOSIS — I6523 Occlusion and stenosis of bilateral carotid arteries: Secondary | ICD-10-CM | POA: Diagnosis not present

## 2018-02-19 DIAGNOSIS — E785 Hyperlipidemia, unspecified: Secondary | ICD-10-CM | POA: Diagnosis not present

## 2018-02-19 DIAGNOSIS — I10 Essential (primary) hypertension: Secondary | ICD-10-CM | POA: Diagnosis not present

## 2018-02-19 LAB — COMPREHENSIVE METABOLIC PANEL
ALBUMIN: 4.5 g/dL (ref 3.5–5.2)
ALK PHOS: 75 U/L (ref 39–117)
ALT: 15 U/L (ref 0–35)
AST: 21 U/L (ref 0–37)
BUN: 25 mg/dL — ABNORMAL HIGH (ref 6–23)
CO2: 33 mEq/L — ABNORMAL HIGH (ref 19–32)
Calcium: 10.1 mg/dL (ref 8.4–10.5)
Chloride: 96 mEq/L (ref 96–112)
Creatinine, Ser: 1.01 mg/dL (ref 0.40–1.20)
GFR: 57.4 mL/min — AB (ref >60.00)
Glucose, Bld: 103 mg/dL — ABNORMAL HIGH (ref 70–99)
POTASSIUM: 3.5 meq/L (ref 3.5–5.1)
Sodium: 138 mEq/L (ref 135–145)
TOTAL PROTEIN: 6.8 g/dL (ref 6.0–8.3)
Total Bilirubin: 0.8 mg/dL (ref 0.2–1.2)

## 2018-02-19 LAB — CBC WITH DIFFERENTIAL/PLATELET
BASOS PCT: 0.6 % (ref 0.0–3.0)
Basophils Absolute: 0.1 10*3/uL (ref 0.0–0.1)
EOS PCT: 3.6 % (ref 0.0–5.0)
Eosinophils Absolute: 0.3 10*3/uL (ref 0.0–0.7)
HCT: 43.5 % (ref 36.0–46.0)
HEMOGLOBIN: 14.7 g/dL (ref 12.0–15.0)
LYMPHS ABS: 2.1 10*3/uL (ref 0.7–4.0)
Lymphocytes Relative: 24 % (ref 12.0–46.0)
MCHC: 33.9 g/dL (ref 30.0–36.0)
MCV: 92.1 fl (ref 78.0–100.0)
MONOS PCT: 12.1 % — AB (ref 3.0–12.0)
Monocytes Absolute: 1.1 10*3/uL — ABNORMAL HIGH (ref 0.1–1.0)
Neutro Abs: 5.2 10*3/uL (ref 1.4–7.7)
Neutrophils Relative %: 59.7 % (ref 43.0–77.0)
Platelets: 324 10*3/uL (ref 150.0–400.0)
RBC: 4.72 Mil/uL (ref 3.87–5.11)
RDW: 12.9 % (ref 11.5–15.5)
WBC: 8.7 10*3/uL (ref 4.0–10.5)

## 2018-02-19 LAB — LIPID PANEL
CHOLESTEROL: 156 mg/dL (ref 0–200)
HDL: 42.3 mg/dL (ref >39.00)
LDL Cholesterol: 90 mg/dL (ref 0–99)
NonHDL: 113.4
TRIGLYCERIDES: 116 mg/dL (ref 0.0–149.0)
Total CHOL/HDL Ratio: 4
VLDL: 23.2 mg/dL (ref 0.0–40.0)

## 2018-02-19 LAB — TSH: TSH: 2.32 u[IU]/mL (ref 0.35–4.50)

## 2018-02-19 MED ORDER — ZOLMITRIPTAN 2.5 MG PO TBDP: 2.5000 mg | ORAL_TABLET | ORAL | 3 refills | Status: DC | PRN

## 2018-02-19 NOTE — Assessment & Plan Note (Signed)
Followed by vascular. Has appt scheduled with them for follow up.

## 2018-02-19 NOTE — Progress Notes (Signed)
Subjective:   Patient ID: Christine Daniels, female    DOB: 1946-07-22, 72 y.o.   MRN: [id number]  Christine Daniels is a pleasant 72 y.o. year old female with h/o HLD, HTN, CVA, carotid stenosis, who presents to clinic today with Follow-up (Patient is here today for AWV? She has been fasting for 4 hours.  She is compliant with her medications. She is scheduled to have Mammogram on 20th.  She agrees to [employer].  )  on 02/19/2018  HPI:  Seeing Glenard Haring, RN later today for annual medicare wellness visit.  Health Maintenance  Topic Date Due  . COLONOSCOPY  12/25/1996  . MAMMOGRAM  01/24/2017  . INFLUENZA VACCINE  04/10/2018  . TETANUS/TDAP  02/10/2022  . DEXA SCAN  Completed  . Hepatitis C Screening  Completed  . PNA vac Low Risk Adult  Completed     HTN- has been well controlled on current dose of Maxzide and Toprol XL. Followed by Dr. Stanford Breed.  Last saw Dr. Stanford Breed on 03/28/17- note reviewed. Toprol increased to 50 mg daily. Lab Results  Component Value Date   CREATININE 0.97 06/05/2017   Migraines- still getting approximately 3- 4 per month but they have improved.  Taking Zonegran for preventative therapy and zomig (low dose) for abortive therapy.   HLD- On crestor 5 mg daily.  Very active.  Wt Readings from Last 3 Encounters:  02/19/18 143 lb 9.6 oz (65.1 kg)  07/08/17 145 lb 1.9 oz (65.8 kg)  06/05/17 145 lb (65.8 kg)    Lab Results  Component Value Date   CHOL 152 06/05/2017   HDL 42.40 06/05/2017   LDLCALC 87 06/05/2017   LDLDIRECT 166.0 05/31/2016   TRIG 110.0 06/05/2017   CHOLHDL 4 06/05/2017   Lab Results  Component Value Date   ALT 18 06/05/2017   AST 21 06/05/2017   ALKPHOS 63 06/05/2017   BILITOT 0.9 06/05/2017    Current Outpatient Medications on File Prior to Visit  Medication Sig Dispense Refill  . aspirin 81 MG tablet Take 81 mg by mouth daily.      . Coenzyme Q10 (CO Q10) 100 MG TABS Take 1 tablet by mouth every other day.     .  loratadine-pseudoephedrine (CLARITIN-D 12-HOUR) 5-120 MG per tablet Take 1 tablet by mouth daily as needed.     . metoprolol succinate (TOPROL-XL) 25 MG 24 hr tablet TAKE 1 TABLET EVERY DAY 90 tablet 3  . potassium chloride SA (KLOR-CON M20) 20 MEQ tablet Take 1 tablet (20 mEq total) by mouth every other day. 15 tablet 3  . rosuvastatin (CRESTOR) 5 MG tablet TAKE 1 TABLET BY MOUTH EVERY OTHER DAY 90 tablet 3  . triamterene-hydrochlorothiazide (MAXZIDE) 75-50 MG per tablet Take 1 tablet by mouth daily. 100 tablet 3  . zonisamide (ZONEGRAN) 100 MG capsule Take 300 mg by mouth at bedtime.     No current facility-administered medications on file prior to visit.     No Known Allergies  Past Medical History:  Diagnosis Date  . Arthritis   . Carotid artery occlusion   . CEREBROVASCULAR DISEASE   . HYPERLIPIDEMIA   . Hypertension   . Migraine     Past Surgical History:  Procedure Laterality Date  . ABDOMINAL HYSTERECTOMY  2010  . BREAST BIOPSY  1987  . Status post bilateral tubal ligation.      Family History  Problem Relation Age of Onset  . Cancer Mother 47  Breast  . Hyperlipidemia Father   . Stroke Father 75    Social History   Socioeconomic History  . Marital status: Married    Spouse name: Not on file  . Number of children: Not on file  . Years of education: Not on file  . Highest education level: Not on file  Occupational History  . Not on file  Social Needs  . Financial resource strain: Not on file  . Food insecurity:    Worry: Not on file    Inability: Not on file  . Transportation needs:    Medical: Not on file    Non-medical: Not on file  Tobacco Use  . Smoking status: Former Smoker    Types: Cigarettes    Last attempt to quit: 09/10/1977    Years since quitting: 40.4  . Smokeless tobacco: Never Used  Substance and Sexual Activity  . Alcohol use: Yes    Alcohol/week: 0.6 oz    Types: 1 Standard drinks or equivalent per week  . Drug use: No  .  Sexual activity: Yes  Lifestyle  . Physical activity:    Days per week: Not on file    Minutes per session: Not on file  . Stress: Not on file  Relationships  . Social connections:    Talks on phone: Not on file    Gets together: Not on file    Attends religious service: Not on file    Active member of club or organization: Not on file    Attends meetings of clubs or organizations: Not on file    Relationship status: Not on file  . Intimate partner violence:    Fear of current or ex partner: Not on file    Emotionally abused: Not on file    Physically abused: Not on file    Forced sexual activity: Not on file  Other Topics Concern  . Not on file  Social History Narrative   She has remote history of minimal tobacco use, but has     not smoked in years.  She rarely consumes alcohol.    Desires CPR   The PMH, PSH, Social History, Family History, Medications, and allergies have been reviewed in [facility], and have been updated if relevant.    Review of Systems  HENT: Negative.   Eyes: Negative.   Respiratory: Negative.   Cardiovascular: Negative.   Gastrointestinal: Negative.   Endocrine: Negative.   Genitourinary: Negative.   Musculoskeletal: Negative.   Skin: Negative.   Allergic/Immunologic: Negative.   Neurological: Negative.   Hematological: Negative.   Psychiatric/Behavioral: Negative.   All other systems reviewed and are negative.      Objective:    BP 110/60 (BP Location: Left Arm, Patient Position: Sitting, Cuff Size: Normal)   Pulse 70   Temp 97.8 F (36.6 C) (Oral)   Ht 5\' 9"  (1.753 m)   Wt 143 lb 9.6 oz (65.1 kg)   SpO2 97%   BMI 21.21 kg/m    Physical Exam  Constitutional: She appears well-developed and well-nourished.  HENT:  Head: Normocephalic and atraumatic.  Right Ear: External ear normal.  Left Ear: External ear normal.  Nose: Nose normal.  Mouth/Throat: Oropharynx is clear and moist.  Eyes: EOM are normal. Pupils are equal, round, and  reactive to light.  Neck: Normal range of motion. Neck supple. No thyromegaly present.  Cardiovascular: Normal rate, regular rhythm, normal heart sounds and intact distal pulses. Exam reveals no gallop and no friction rub.  No murmur heard.  Pulmonary/Chest: Effort normal and breath sounds normal.  Abdominal: Soft. Bowel sounds are normal. She exhibits no distension and no mass. There is no tenderness. There is no rebound.  Musculoskeletal: Normal range of motion.  Lymphadenopathy:  She has no cervical adenopathy.  Neurological: She is alert. She has normal reflexes. No cranial nerve deficit. She exhibits normal muscle tone. Coordination normal.  Skin: Skin is warm and dry.  Psychiatric: She has a normal mood and affect. Her behavior is normal. Judgment and thought content normal.        Assessment & Plan:   Hyperlipidemia, unspecified hyperlipidemia type - Plan: CBC with Differential/Platelet, Comprehensive metabolic panel, Lipid panel, TSH  Bilateral carotid artery stenosis  Essential hypertension  History of migraine headaches No follow-ups on file.

## 2018-02-19 NOTE — Patient Instructions (Signed)
Great to see you. I will call you with your lab results from today and you can view them online.   

## 2018-02-19 NOTE — Assessment & Plan Note (Signed)
Well controlled.  No changes made. 

## 2018-02-19 NOTE — Assessment & Plan Note (Signed)
Stable on current rxs. Does not overuse zomig and is taking low dose. Labs today.

## 2018-02-19 NOTE — Assessment & Plan Note (Signed)
Continue crestor. Check labs today. 

## 2018-02-27 DIAGNOSIS — Z1231 Encounter for screening mammogram for malignant neoplasm of breast: Secondary | ICD-10-CM | POA: Diagnosis not present

## 2018-03-03 DIAGNOSIS — Z1211 Encounter for screening for malignant neoplasm of colon: Secondary | ICD-10-CM | POA: Diagnosis not present

## 2018-03-03 DIAGNOSIS — Z1212 Encounter for screening for malignant neoplasm of rectum: Secondary | ICD-10-CM | POA: Diagnosis not present

## 2018-03-25 ENCOUNTER — Encounter: Payer: Self-pay | Admitting: Family Medicine

## 2018-03-29 ENCOUNTER — Other Ambulatory Visit: Payer: Self-pay | Admitting: Cardiology

## 2018-04-03 DIAGNOSIS — H524 Presbyopia: Secondary | ICD-10-CM | POA: Diagnosis not present

## 2018-04-15 NOTE — Progress Notes (Signed)
Subjective:   Christine Daniels is a 72 y.o. female who presents for Medicare Annual (Subsequent) preventive examination. Pt still works full time for [employer] in Therapist, art.  Review of Systems: No ROS.  Medicare Wellness Visit. Additional risk factors are reflected in the social history. Cardiac Risk Factors include: advanced age (>47men, >39 women);dyslipidemia;hypertension Sleep patterns: occasional trouble staying asleep.  Home Safety/Smoke Alarms: Feels safe in home. Smoke alarms in place.  Living environment; residence and Firearm Safety: Lives with husband. 2 story home. 1 dog.  Eye- Dr.Scott yearly   Female:        Mammo-utd       Dexa scan-utd        CCS- cologuard 02/08/18-normal per pt     Objective:     Vitals: BP (!) 144/80 (BP Location: Left Arm, Patient Position: Sitting, Cuff Size: Normal)   Pulse 74   Ht 5\' 9"  (1.753 m)   Wt 142 lb (64.4 kg)   SpO2 98%   BMI 20.97 kg/m   Body mass index is 20.97 kg/m.  Advanced Directives 04/16/2018 08/24/2015 08/25/2014  Does Patient Have a Medical Advance Directive? Yes Yes Yes  Type of Paramedic of [place];Living will [place];Living will Living will;Healthcare Power of Attorney  Does patient want to make changes to medical advance directive? - - No - Patient declined  Copy of [place] in Chart? No - copy requested - No - copy requested    Tobacco Social History   Tobacco Use  Smoking Status Former Smoker  . Types: Cigarettes  . Last attempt to quit: 09/10/1977  . Years since quitting: 40.6  Smokeless Tobacco Never Used     Counseling given: Not Answered   Clinical Intake: Pain : No/denies pain    Past Medical History:  Diagnosis Date  . Arthritis   . Carotid artery occlusion   . CEREBROVASCULAR DISEASE   . HYPERLIPIDEMIA   . Hypertension   . Migraine    Past Surgical History:  Procedure Laterality Date  . ABDOMINAL  HYSTERECTOMY  2010  . BREAST BIOPSY  1987  . Status post bilateral tubal ligation.     Family History  Problem Relation Age of Onset  . Cancer Mother 39       Breast  . Hyperlipidemia Father   . Stroke Father 35   Social History   Socioeconomic History  . Marital status: Married    Spouse name: Not on file  . Number of children: Not on file  . Years of education: Not on file  . Highest education level: Not on file  Occupational History  . Not on file  Social Needs  . Financial resource strain: Not on file  . Food insecurity:    Worry: Not on file    Inability: Not on file  . Transportation needs:    Medical: Not on file    Non-medical: Not on file  Tobacco Use  . Smoking status: Former Smoker    Types: Cigarettes    Last attempt to quit: 09/10/1977    Years since quitting: 40.6  . Smokeless tobacco: Never Used  Substance and Sexual Activity  . Alcohol use: Yes    Alcohol/week: 0.6 oz    Types: 1 Standard drinks or equivalent per week  . Drug use: No  . Sexual activity: Yes  Lifestyle  . Physical activity:    Days per week: Not on file    Minutes  per session: Not on file  . Stress: Not on file  Relationships  . Social connections:    Talks on phone: Not on file    Gets together: Not on file    Attends religious service: Not on file    Active member of club or organization: Not on file    Attends meetings of clubs or organizations: Not on file    Relationship status: Not on file  Other Topics Concern  . Not on file  Social History Narrative   She has remote history of minimal tobacco use, but has     not smoked in years.  She rarely consumes alcohol.    Desires CPR    Outpatient Encounter Medications as of 04/16/2018  Medication Sig  . aspirin 81 MG tablet Take 81 mg by mouth daily.    . Coenzyme Q10 (CO Q10) 100 MG TABS Take 1 tablet by mouth every other day.   . loratadine-pseudoephedrine (CLARITIN-D 12-HOUR) 5-120 MG per tablet Take 1 tablet by mouth  daily as needed.   . metoprolol succinate (TOPROL-XL) 25 MG 24 hr tablet Take 1 tablet (25 mg total) by mouth daily. KEEP OV.  Marland Kitchen potassium chloride SA (KLOR-CON M20) 20 MEQ tablet Take 1 tablet (20 mEq total) by mouth every other day.  . rosuvastatin (CRESTOR) 5 MG tablet TAKE 1 TABLET BY MOUTH EVERY OTHER DAY  . triamterene-hydrochlorothiazide (MAXZIDE) 75-50 MG per tablet Take 1 tablet by mouth daily.  Marland Kitchen zolmitriptan (ZOMIG-ZMT) 2.5 MG disintegrating tablet Take 1 tablet (2.5 mg total) by mouth as needed for migraine.  Marland Kitchen zonisamide (ZONEGRAN) 100 MG capsule Take 300 mg by mouth at bedtime.   No facility-administered encounter medications on file as of 04/16/2018.     Activities of Daily Living In your present state of health, do you have any difficulty performing the following activities: 04/16/2018 07/08/2017  Hearing? N N  Vision? N N  Difficulty concentrating or making decisions? N N  Walking or climbing stairs? N N  Dressing or bathing? N N  Doing errands, shopping? N N  Preparing Food and eating ? N -  Using the Toilet? N -  In the past six months, have you accidently leaked urine? N -  Do you have problems with loss of bowel control? N -  Managing your Medications? N -  Managing your Finances? N -  Housekeeping or managing your Housekeeping? N -  Some recent data might be hidden    Patient Care Team: Lucille Passy, MD as PCP - General (Family Medicine) Orie Rout, MD as Referring Physician (Specialist) Suella Broad, MD (Physical Medicine and Rehabilitation) Stanford Breed, Denice Bors, MD as Consulting Physician (Cardiology) Angelia Mould, MD as Consulting Physician (Vascular Surgery) Vania Rea, MD as Consulting Physician (Obstetrics and Gynecology) Levy Sjogren, MD as Referring Physician (Dermatology)    Assessment:   This is a routine wellness examination for Christine Daniels. Physical assessment deferred to PCP.  Exercise Activities and Dietary  recommendations Current Exercise Habits: Home exercise routine, Type of exercise: walking, Time (Minutes): 25, Frequency (Times/Week): 3, Weekly Exercise (Minutes/Week): 75, Exercise limited by: None identified Diet (meal preparation, eat out, water intake, caffeinated beverages, dairy products, fruits and vegetables): in general, a "healthy" diet  , well balanced   Goals    None      Fall Risk Fall Risk  04/16/2018 07/08/2017 05/31/2016  Falls in the past year? No No No    Depression Screen PHQ 2/9 Scores 04/16/2018 07/08/2017  05/31/2016  PHQ - 2 Score 0 0 0     Cognitive Function Ad8 score reviewed for issues:  Issues making decisions:no  Less interest in hobbies / activities:no  Repeats questions, stories (family complaining):no  Trouble using ordinary gadgets (microwave, computer, phone):no  Forgets the month or year:no   Mismanaging finances: no  Remembering appts:no  Daily problems with thinking and/or memory:no Ad8 score is=0         Immunization History  Administered Date(s) Administered  . Influenza,inj,Quad PF,6+ Mos 06/05/2013, 06/21/2014, 05/31/2016, 06/05/2017  . Influenza,inj,quad, With Preservative 06/10/2017  . Pneumococcal Conjugate-13 07/27/2014  . Pneumococcal Polysaccharide-23 06/05/2013  . Tdap 02/11/2012    Screening Tests Health Maintenance  Topic Date Due  . INFLUENZA VACCINE  04/10/2018  . MAMMOGRAM  03/25/2020  . TETANUS/TDAP  02/10/2022  . COLONOSCOPY  02/09/2028  . DEXA SCAN  Completed  . Hepatitis C Screening  Completed  . PNA vac Low Risk Adult  Completed      Plan:    Please schedule your next medicare wellness visit with me in 1 yr.  Continue to eat heart healthy diet (full of fruits, vegetables, whole grains, lean protein, water--limit salt, fat, and sugar intake) and increase physical activity as tolerated.  Bring a copy of your living will and/or healthcare power of attorney to your next office visit.  Please bring a  copy of your cologuard report to next visit.   I have personally reviewed and noted the following in the patient's chart:   . Medical and social history . Use of alcohol, tobacco or illicit drugs  . Current medications and supplements . Functional ability and status . Nutritional status . Physical activity . Advanced directives . List of other physicians . Hospitalizations, surgeries, and ER visits in previous 12 months . Vitals . Screenings to include cognitive, depression, and falls . Referrals and appointments  In addition, I have reviewed and discussed with patient certain preventive protocols, quality metrics, and best practice recommendations. A written personalized care plan for preventive services as well as general preventive health recommendations were provided to patient.     Shela Nevin, [place]  04/16/2018

## 2018-04-16 ENCOUNTER — Encounter: Payer: Self-pay | Admitting: Behavioral Health

## 2018-04-16 ENCOUNTER — Ambulatory Visit: Payer: Medicare Other | Admitting: Behavioral Health

## 2018-04-16 VITALS — BP 144/80 | HR 74 | Ht 69.0 in | Wt 142.0 lb

## 2018-04-16 DIAGNOSIS — Z0000 Encounter for general adult medical examination without abnormal findings: Secondary | ICD-10-CM

## 2018-04-16 NOTE — Progress Notes (Signed)
I reviewed health advisor's note, was available for consultation, and agree with documentation and plan.  

## 2018-04-16 NOTE — Progress Notes (Signed)
HPI: FU hypertension; also with cerebrovascular disease followed by vascular surgery. Since last seen,  she denies dyspnea, chest pain, palpitations or syncope.  Current Outpatient Medications  Medication Sig Dispense Refill  . aspirin 81 MG tablet Take 81 mg by mouth daily.      . Coenzyme Q10 (CO Q10) 100 MG TABS Take 1 tablet by mouth every other day.     . loratadine-pseudoephedrine (CLARITIN-D 12-HOUR) 5-120 MG per tablet Take 1 tablet by mouth daily as needed.     . metoprolol succinate (TOPROL-XL) 25 MG 24 hr tablet Take 1 tablet (25 mg total) by mouth daily. KEEP OV. 90 tablet 0  . potassium chloride SA (KLOR-CON M20) 20 MEQ tablet Take 1 tablet (20 mEq total) by mouth every other day. 15 tablet 3  . rosuvastatin (CRESTOR) 5 MG tablet TAKE 1 TABLET BY MOUTH EVERY OTHER DAY 90 tablet 3  . triamterene-hydrochlorothiazide (MAXZIDE) 75-50 MG per tablet Take 1 tablet by mouth daily. 100 tablet 3  . zolmitriptan (ZOMIG-ZMT) 2.5 MG disintegrating tablet Take 1 tablet (2.5 mg total) by mouth as needed for migraine. 10 tablet 3  . zonisamide (ZONEGRAN) 100 MG capsule Take 300 mg by mouth at bedtime.     No current facility-administered medications for this visit.      Past Medical History:  Diagnosis Date  . Arthritis   . Carotid artery occlusion   . CEREBROVASCULAR DISEASE   . HYPERLIPIDEMIA   . Hypertension   . Migraine     Past Surgical History:  Procedure Laterality Date  . ABDOMINAL HYSTERECTOMY  2010  . BREAST BIOPSY  1987  . Status post bilateral tubal ligation.      Social History   Socioeconomic History  . Marital status: Married    Spouse name: Not on file  . Number of children: Not on file  . Years of education: Not on file  . Highest education level: Not on file  Occupational History  . Not on file  Social Needs  . Financial resource strain: Not on file  . Food insecurity:    Worry: Not on file    Inability: Not on file  . Transportation needs:      Medical: Not on file    Non-medical: Not on file  Tobacco Use  . Smoking status: Former Smoker    Types: Cigarettes    Last attempt to quit: 09/10/1977    Years since quitting: 40.6  . Smokeless tobacco: Never Used  Substance and Sexual Activity  . Alcohol use: Yes    Alcohol/week: 1.0 standard drinks    Types: 1 Standard drinks or equivalent per week  . Drug use: No  . Sexual activity: Yes  Lifestyle  . Physical activity:    Days per week: Not on file    Minutes per session: Not on file  . Stress: Not on file  Relationships  . Social connections:    Talks on phone: Not on file    Gets together: Not on file    Attends religious service: Not on file    Active member of club or organization: Not on file    Attends meetings of clubs or organizations: Not on file    Relationship status: Not on file  . Intimate partner violence:    Fear of current or ex partner: Not on file    Emotionally abused: Not on file    Physically abused: Not on file    Forced sexual  activity: Not on file  Other Topics Concern  . Not on file  Social History Narrative   She has remote history of minimal tobacco use, but has     not smoked in years.  She rarely consumes alcohol.    Desires CPR    Family History  Problem Relation Age of Onset  . Cancer Mother 91       Breast  . Hyperlipidemia Father   . Stroke Father 96    ROS: no fevers or chills, productive cough, hemoptysis, dysphasia, odynophagia, melena, hematochezia, dysuria, hematuria, rash, seizure activity, orthopnea, PND, pedal edema, claudication. Remaining systems are negative.  Physical Exam: Well-developed well-nourished in no acute distress.  Skin is warm and dry.  HEENT is normal.  Neck is supple.  Chest is clear to auscultation with normal expansion.  Cardiovascular exam is regular rate and rhythm.  Abdominal exam nontender or distended. No masses palpated. Extremities show no edema. neuro grossly intact  ECG-normal  sinus rhythm at a rate of 73.  No ST changes.  Personally reviewed  A/P  1 hypertension-blood pressure is elevated.  Add Cozaar 50 mg daily.  Follow blood pressure and adjust regimen as needed.  Check potassium and renal function in 1 week.  2 hyperlipidemia-continue present medications.  Lipids and liver monitored by primary care.  3 carotid artery disease-followed by vascular surgery.  Kirk Ruths, MD

## 2018-04-16 NOTE — Patient Instructions (Signed)
Please schedule your next medicare wellness visit with me in 1 yr.  Continue to eat heart healthy diet (full of fruits, vegetables, whole grains, lean protein, water--limit salt, fat, and sugar intake) and increase physical activity as tolerated.  Bring a copy of your living will and/or healthcare power of attorney to your next office visit.  Please bring a copy of your cologuard report to next visit.    Christine Daniels , Thank you for taking time to come for your Medicare Wellness Visit. I appreciate your ongoing commitment to your health goals. Please review the following plan we discussed and let me know if I can assist you in the future.   These are the goals we discussed: Goals    . Maintain healthy active lifestyle.       This is a list of the screening recommended for you and due dates:  Health Maintenance  Topic Date Due  . Flu Shot  04/10/2018  . Mammogram  03/25/2020  . Tetanus Vaccine  02/10/2022  . Colon Cancer Screening  02/09/2028  . DEXA scan (bone density measurement)  Completed  .  Hepatitis C: One time screening is recommended by Center for Disease Control  (CDC) for  adults born from [birth year] through 1964.   Completed  . Pneumonia vaccines  Completed    Health Maintenance for Postmenopausal Women Menopause is a normal process in which your reproductive ability comes to an end. This process happens gradually over a span of months to years, usually between the ages of 15 and 55. Menopause is complete when you have missed 12 consecutive menstrual periods. It is important to talk with your health care provider about some of the most common conditions that affect postmenopausal women, such as heart disease, cancer, and bone loss (osteoporosis). Adopting a healthy lifestyle and getting preventive care can help to promote your health and wellness. Those actions can also lower your chances of developing some of these common conditions. What should I know about menopause? During  menopause, you may experience a number of symptoms, such as:  Moderate-to-severe hot flashes.  Night sweats.  Decrease in sex drive.  Mood swings.  Headaches.  Tiredness.  Irritability.  Memory problems.  Insomnia.  Choosing to treat or not to treat menopausal changes is an individual decision that you make with your health care provider. What should I know about hormone replacement therapy and supplements? Hormone therapy products are effective for treating symptoms that are associated with menopause, such as hot flashes and night sweats. Hormone replacement carries certain risks, especially as you become older. If you are thinking about using estrogen or estrogen with progestin treatments, discuss the benefits and risks with your health care provider. What should I know about heart disease and stroke? Heart disease, heart attack, and stroke become more likely as you age. This may be due, in part, to the hormonal changes that your body experiences during menopause. These can affect how your body processes dietary fats, triglycerides, and cholesterol. Heart attack and stroke are both medical emergencies. There are many things that you can do to help prevent heart disease and stroke:  Have your blood pressure checked at least every 1-2 years. High blood pressure causes heart disease and increases the risk of stroke.  If you are 77-40 years old, ask your health care provider if you should take aspirin to prevent a heart attack or a stroke.  Do not use any tobacco products, including cigarettes, chewing tobacco, or electronic cigarettes. If  you need help quitting, ask your health care provider.  It is important to eat a healthy diet and maintain a healthy weight. ? Be sure to include plenty of vegetables, fruits, low-fat dairy products, and lean protein. ? Avoid eating foods that are high in solid fats, added sugars, or salt (sodium).  Get regular exercise. This is one of the most  important things that you can do for your health. ? Try to exercise for at least 150 minutes each week. The type of exercise that you do should increase your heart rate and make you sweat. This is known as moderate-intensity exercise. ? Try to do strengthening exercises at least twice each week. Do these in addition to the moderate-intensity exercise.  Know your numbers.Ask your health care provider to check your cholesterol and your blood glucose. Continue to have your blood tested as directed by your health care provider.  What should I know about cancer screening? There are several types of cancer. Take the following steps to reduce your risk and to catch any cancer development as early as possible. Breast Cancer  Practice breast self-awareness. ? This means understanding how your breasts normally appear and feel. ? It also means doing regular breast self-exams. Let your health care provider know about any changes, no matter how small.  If you are 49 or older, have a clinician do a breast exam (clinical breast exam or CBE) every year. Depending on your age, family history, and medical history, it may be recommended that you also have a yearly breast X-ray (mammogram).  If you have a family history of breast cancer, talk with your health care provider about genetic screening.  If you are at high risk for breast cancer, talk with your health care provider about having an MRI and a mammogram every year.  Breast cancer (BRCA) gene test is recommended for women who have family members with BRCA-related cancers. Results of the assessment will determine the need for genetic counseling and BRCA1 and for BRCA2 testing. BRCA-related cancers include these types: ? Breast. This occurs in males or females. ? Ovarian. ? Tubal. This may also be called fallopian tube cancer. ? Cancer of the abdominal or pelvic lining (peritoneal cancer). ? Prostate. ? Pancreatic.  Cervical, Uterine, and Ovarian  Cancer Your health care provider may recommend that you be screened regularly for cancer of the pelvic organs. These include your ovaries, uterus, and vagina. This screening involves a pelvic exam, which includes checking for microscopic changes to the surface of your cervix (Pap test).  For women ages 21-65, health care providers may recommend a pelvic exam and a Pap test every three years. For women ages 64-65, they may recommend the Pap test and pelvic exam, combined with testing for human papilloma virus (HPV), every five years. Some types of HPV increase your risk of cervical cancer. Testing for HPV may also be done on women of any age who have unclear Pap test results.  Other health care providers may not recommend any screening for nonpregnant women who are considered low risk for pelvic cancer and have no symptoms. Ask your health care provider if a screening pelvic exam is right for you.  If you have had past treatment for cervical cancer or a condition that could lead to cancer, you need Pap tests and screening for cancer for at least 20 years after your treatment. If Pap tests have been discontinued for you, your risk factors (such as having a new sexual partner) need to  be reassessed to determine if you should start having screenings again. Some women have medical problems that increase the chance of getting cervical cancer. In these cases, your health care provider may recommend that you have screening and Pap tests more often.  If you have a family history of uterine cancer or ovarian cancer, talk with your health care provider about genetic screening.  If you have vaginal bleeding after reaching menopause, tell your health care provider.  There are currently no reliable tests available to screen for ovarian cancer.  Lung Cancer Lung cancer screening is recommended for adults 62-20 years old who are at high risk for lung cancer because of a history of smoking. A yearly low-dose CT scan  of the lungs is recommended if you:  Currently smoke.  Have a history of at least 30 pack-years of smoking and you currently smoke or have quit within the past 15 years. A pack-year is smoking an average of one pack of cigarettes per day for one year.  Yearly screening should:  Continue until it has been 15 years since you quit.  Stop if you develop a health problem that would prevent you from having lung cancer treatment.  Colorectal Cancer  This type of cancer can be detected and can often be prevented.  Routine colorectal cancer screening usually begins at age 35 and continues through age 20.  If you have risk factors for colon cancer, your health care provider may recommend that you be screened at an earlier age.  If you have a family history of colorectal cancer, talk with your health care provider about genetic screening.  Your health care provider may also recommend using home test kits to check for hidden blood in your stool.  A small camera at the end of a tube can be used to examine your colon directly (sigmoidoscopy or colonoscopy). This is done to check for the earliest forms of colorectal cancer.  Direct examination of the colon should be repeated every 5-10 years until age 1. However, if early forms of precancerous polyps or small growths are found or if you have a family history or genetic risk for colorectal cancer, you may need to be screened more often.  Skin Cancer  Check your skin from head to toe regularly.  Monitor any moles. Be sure to tell your health care provider: ? About any new moles or changes in moles, especially if there is a change in a mole's shape or color. ? If you have a mole that is larger than the size of a pencil eraser.  If any of your family members has a history of skin cancer, especially at a young age, talk with your health care provider about genetic screening.  Always use sunscreen. Apply sunscreen liberally and repeatedly  throughout the day.  Whenever you are outside, protect yourself by wearing long sleeves, pants, a wide-brimmed hat, and sunglasses.  What should I know about osteoporosis? Osteoporosis is a condition in which bone destruction happens more quickly than new bone creation. After menopause, you may be at an increased risk for osteoporosis. To help prevent osteoporosis or the bone fractures that can happen because of osteoporosis, the following is recommended:  If you are 9-40 years old, get at least 1,000 mg of calcium and at least 600 mg of vitamin D per day.  If you are older than age 39 but younger than age 6, get at least 1,200 mg of calcium and at least 600 mg of vitamin D  per day.  If you are older than age 42, get at least 1,200 mg of calcium and at least 800 mg of vitamin D per day.  Smoking and excessive alcohol intake increase the risk of osteoporosis. Eat foods that are rich in calcium and vitamin D, and do weight-bearing exercises several times each week as directed by your health care provider. What should I know about how menopause affects my mental health? Depression may occur at any age, but it is more common as you become older. Common symptoms of depression include:  Low or sad mood.  Changes in sleep patterns.  Changes in appetite or eating patterns.  Feeling an overall lack of motivation or enjoyment of activities that you previously enjoyed.  Frequent crying spells.  Talk with your health care provider if you think that you are experiencing depression. What should I know about immunizations? It is important that you get and maintain your immunizations. These include:  Tetanus, diphtheria, and pertussis (Tdap) booster vaccine.  Influenza every year before the flu season begins.  Pneumonia vaccine.  Shingles vaccine.  Your health care provider may also recommend other immunizations. This information is not intended to replace advice given to you by your health  care provider. Make sure you discuss any questions you have with your health care provider. Document Released: 10/19/2005 Document Revised: 03/16/2016 Document Reviewed: 05/31/2015 Elsevier Interactive Patient Education  2018 [employer].

## 2018-04-18 ENCOUNTER — Encounter: Payer: Self-pay | Admitting: Cardiology

## 2018-04-18 ENCOUNTER — Ambulatory Visit: Payer: Medicare Other | Admitting: Cardiology

## 2018-04-18 VITALS — BP 150/90 | HR 73 | Ht 69.0 in | Wt 139.4 lb

## 2018-04-18 DIAGNOSIS — E7800 Pure hypercholesterolemia, unspecified: Secondary | ICD-10-CM

## 2018-04-18 DIAGNOSIS — I6529 Occlusion and stenosis of unspecified carotid artery: Secondary | ICD-10-CM

## 2018-04-18 DIAGNOSIS — I10 Essential (primary) hypertension: Secondary | ICD-10-CM

## 2018-04-18 MED ORDER — LOSARTAN POTASSIUM 50 MG PO TABS: 50.0000 mg | ORAL_TABLET | Freq: Every day | ORAL | 3 refills | Status: DC

## 2018-04-18 NOTE — Patient Instructions (Signed)
Medication Instructions:   START LOSARTAN 50 MG ONCE DAILY  Labwork:  Your physician recommends that you return for lab work in: [place]:  Your physician wants you to follow-up in: [place] will receive a reminder letter in the mail two months in advance. If you don't receive a letter, please call our office to schedule the follow-up appointment.   If you need a refill on your cardiac medications before your next appointment, please call your pharmacy.

## 2018-04-24 DIAGNOSIS — I10 Essential (primary) hypertension: Secondary | ICD-10-CM | POA: Diagnosis not present

## 2018-04-25 ENCOUNTER — Telehealth: Payer: Self-pay | Admitting: Cardiology

## 2018-04-25 ENCOUNTER — Other Ambulatory Visit: Payer: Self-pay | Admitting: Cardiology

## 2018-04-25 LAB — BASIC METABOLIC PANEL
BUN / CREAT RATIO: 26 (ref 12–28)
BUN: 38 mg/dL — AB (ref 8–27)
CALCIUM: 9.8 mg/dL (ref 8.7–10.3)
CHLORIDE: 96 mmol/L (ref 96–106)
CO2: 25 mmol/L (ref 20–29)
CREATININE: 1.48 mg/dL — AB (ref 0.57–1.00)
GFR, EST AFRICAN AMERICAN: 41 mL/min/{1.73_m2} — AB (ref >59)
GFR, EST NON AFRICAN AMERICAN: 35 mL/min/{1.73_m2} — AB (ref >59)
Glucose: 92 mg/dL (ref 65–99)
Potassium: 4 mmol/L (ref 3.5–5.2)
Sodium: 139 mmol/L (ref 134–144)

## 2018-04-25 MED ORDER — LOSARTAN POTASSIUM 25 MG PO TABS: 25.0000 mg | ORAL_TABLET | Freq: Every day | ORAL | Status: DC

## 2018-04-25 NOTE — Telephone Encounter (Signed)
Spoke with pt.   Pt sts that here BP this morning was 100/50. Pt is asymptomatic.  She has not taken her morning medications. Pt sts that she did not see Dr.Crenshaws my chart response and has still ben taking losartan 50mg  daily.  Adv pt per Dr.Crenshaw's recommendation she should.  Lelon Perla, MD        8:44 PM  Decrease cozaar to 25 mg daily and follow BP  Kirk Ruths   Adv pt to cut her 50mg  tab in half and take 25mg  daily. Adv pt to liberalized fluids and hydrate. She should continue to monitor her BP daily, and call the office if her BP continues to run low.  Pt agreeable with plan and verbalized understanding. Update fwd to Dr.Crenshaw.

## 2018-04-25 NOTE — Telephone Encounter (Signed)
New Message       Pt c/o medication issue:  1. Name of Medication: Losartan 50 mg  2. How are you currently taking this medication (dosage and times per day)? Once a day  3. Are you having a reaction (difficulty breathing--STAT)? No  4. What is your medication issue? Muscle cramps, back pain. bp running low      100/50

## 2018-04-29 ENCOUNTER — Other Ambulatory Visit: Payer: Self-pay | Admitting: *Deleted

## 2018-04-29 DIAGNOSIS — N289 Disorder of kidney and ureter, unspecified: Secondary | ICD-10-CM

## 2018-04-30 ENCOUNTER — Telehealth: Payer: Self-pay | Admitting: Cardiology

## 2018-04-30 NOTE — Telephone Encounter (Signed)
SPOKE TO PATIENT , RESULT OF BMP --- GIVEN --- AWARE TO STOP COZAAR  RECHECK BMP IN 7 DAYS.

## 2018-04-30 NOTE — Telephone Encounter (Signed)
New Message ° ° °Pt returning call for nurse °

## 2018-05-08 ENCOUNTER — Encounter: Payer: Self-pay | Admitting: Family Medicine

## 2018-05-08 MED ORDER — ZONISAMIDE 100 MG PO CAPS: 300.0000 mg | ORAL_CAPSULE | Freq: Every day | ORAL | 0 refills | Status: DC

## 2018-05-09 DIAGNOSIS — N289 Disorder of kidney and ureter, unspecified: Secondary | ICD-10-CM | POA: Diagnosis not present

## 2018-05-09 LAB — BASIC METABOLIC PANEL
BUN/Creatinine Ratio: 24 (ref 12–28)
BUN: 26 mg/dL (ref 8–27)
CALCIUM: 9.9 mg/dL (ref 8.7–10.3)
CHLORIDE: 94 mmol/L — AB (ref 96–106)
CO2: 27 mmol/L (ref 20–29)
Creatinine, Ser: 1.1 mg/dL — ABNORMAL HIGH (ref 0.57–1.00)
GFR, EST AFRICAN AMERICAN: 58 mL/min/{1.73_m2} — AB (ref >59)
GFR, EST NON AFRICAN AMERICAN: 51 mL/min/{1.73_m2} — AB (ref >59)
Glucose: 89 mg/dL (ref 65–99)
POTASSIUM: 3.5 mmol/L (ref 3.5–5.2)
Sodium: 139 mmol/L (ref 134–144)

## 2018-06-06 ENCOUNTER — Encounter: Payer: Self-pay | Admitting: Family Medicine

## 2018-06-25 ENCOUNTER — Other Ambulatory Visit: Payer: Self-pay | Admitting: Cardiology

## 2018-06-25 NOTE — Telephone Encounter (Signed)
Rx request sent to pharmacy.  

## 2018-08-03 ENCOUNTER — Other Ambulatory Visit: Payer: Self-pay | Admitting: Family Medicine

## 2018-08-09 ENCOUNTER — Other Ambulatory Visit: Payer: Self-pay | Admitting: Family Medicine

## 2018-09-16 ENCOUNTER — Other Ambulatory Visit: Payer: Self-pay | Admitting: Cardiology

## 2018-10-16 DIAGNOSIS — M5030 Other cervical disc degeneration, unspecified cervical region: Secondary | ICD-10-CM | POA: Diagnosis not present

## 2018-11-02 ENCOUNTER — Other Ambulatory Visit: Payer: Self-pay | Admitting: Family Medicine

## 2018-12-14 ENCOUNTER — Other Ambulatory Visit: Payer: Self-pay | Admitting: Cardiology

## 2018-12-15 NOTE — Telephone Encounter (Signed)
Toprol XL 25 MG refilled.

## 2018-12-15 NOTE — Telephone Encounter (Signed)
Toprol XL 25 refilled.

## 2019-01-26 ENCOUNTER — Other Ambulatory Visit: Payer: Self-pay | Admitting: Family Medicine

## 2019-01-26 NOTE — Telephone Encounter (Signed)
Was advised last time that needed appt for future fills/pt must schedule virtual visit/thx dmf

## 2019-02-11 ENCOUNTER — Other Ambulatory Visit: Payer: Self-pay

## 2019-02-11 ENCOUNTER — Encounter: Payer: Self-pay | Admitting: Family Medicine

## 2019-02-11 ENCOUNTER — Other Ambulatory Visit: Payer: Self-pay | Admitting: Family Medicine

## 2019-02-11 MED ORDER — ZONISAMIDE 100 MG PO CAPS: ORAL_CAPSULE | ORAL | 0 refills | Status: DC

## 2019-02-13 ENCOUNTER — Encounter: Payer: Self-pay | Admitting: Family Medicine

## 2019-02-16 ENCOUNTER — Other Ambulatory Visit: Payer: Self-pay

## 2019-02-16 MED ORDER — ZONISAMIDE 100 MG PO CAPS: ORAL_CAPSULE | ORAL | 0 refills | Status: DC

## 2019-02-25 ENCOUNTER — Ambulatory Visit: Payer: Medicare Other | Admitting: Family Medicine

## 2019-02-25 ENCOUNTER — Encounter: Payer: Self-pay | Admitting: Family Medicine

## 2019-02-27 NOTE — Progress Notes (Signed)
Virtual Visit via Video   Due to the COVID-19 pandemic, this visit was completed with telemedicine (audio/video) technology to reduce patient and provider exposure as well as to preserve personal protective equipment.   I connected with Christine Daniels by a video enabled telemedicine application and verified that I am speaking with the correct person using two identifiers. Location patient: Home Location provider: [facility], Office Persons participating in the virtual visit: Kizzy W Dorothyann Gibbs, MD   I discussed the limitations of evaluation and management by telemedicine and the availability of in person appointments. The patient expressed understanding and agreed to proceed.  Care Team   Patient Care Team: Lucille Passy, MD as PCP - General (Family Medicine) Orie Rout, MD as Referring Physician (Specialist) Suella Broad, MD (Physical Medicine and Rehabilitation) Stanford Breed, Denice Bors, MD as Consulting Physician (Cardiology) Angelia Mould, MD as Consulting Physician (Vascular Surgery) Vania Rea, MD as Consulting Physician (Obstetrics and Gynecology) Levy Sjogren, MD as Referring Physician (Dermatology)  Subjective:   HPI:   Migraines- Pt sent the following mychart message on 02/11/19:  "My Zonisimide 100mg  needs refill and I realize I need to come in- Did not know whether [facility] had - called [facility] in- so to speak- under circumsances was going to wait until a bit later for check up - if [place] to bother you all"  Her preventative zonegran is working well, rarely needs abortive therapy zomig.  BP is followed by Dr. Stanford Breed and has ben under excellent control.  Review of Systems  Constitutional: Negative.   HENT: Negative.   Eyes: Negative.   Respiratory: Negative.   Cardiovascular: Negative.   Gastrointestinal: Negative.   Genitourinary: Negative.   Musculoskeletal: Negative.   Skin: Negative.   Neurological:  Negative.   Endo/Heme/Allergies: Negative.   Psychiatric/Behavioral: Negative.   All other systems reviewed and are negative.    Patient Active Problem List   Diagnosis Date Noted  . Degeneration of lumbar intervertebral disc 11/04/2017  . DDD (degenerative disc disease), cervical 10/11/2017  . Carotid stenosis 08/13/2012  . History of migraine headaches 02/11/2012  . HYPOKALEMIA 11/03/2009  . Essential hypertension 11/03/2009  . CEREBROVASCULAR DISEASE 11/03/2009  . HLD (hyperlipidemia) 12/30/2008    Social History   Tobacco Use  . Smoking status: Former Smoker    Types: Cigarettes    Quit date: 09/10/1977    Years since quitting: 41.4  . Smokeless tobacco: Never Used  Substance Use Topics  . Alcohol use: Yes    Alcohol/week: 1.0 standard drinks    Types: 1 Standard drinks or equivalent per week    Current Outpatient Medications:  .  aspirin 81 MG tablet, Take 81 mg by mouth daily.  , Disp: , Rfl:  .  Coenzyme Q10 (CO Q10) 100 MG TABS, Take 1 tablet by mouth every other day. , Disp: , Rfl:  .  KLOR-CON M20 20 MEQ tablet, TAKE 1 TABLET BY MOUTH EVERY OTHER DAY., Disp: 45 tablet, Rfl: 9 .  loratadine-pseudoephedrine (CLARITIN-D 12-HOUR) 5-120 MG per tablet, Take 1 tablet by mouth daily as needed. , Disp: , Rfl:  .  metoprolol succinate (TOPROL-XL) 25 MG 24 hr tablet, TAKE 1 TABLET BY MOUTH EVERY DAY, Disp: 90 tablet, Rfl: 1 .  rosuvastatin (CRESTOR) 5 MG tablet, TAKE 1 TABLET BY MOUTH EVERY OTHER DAY, Disp: 90 tablet, Rfl: 3 .  triamterene-hydrochlorothiazide (MAXZIDE) 75-50 MG per tablet, Take 1 tablet by mouth daily., Disp: 100  tablet, Rfl: 3 .  zolmitriptan (ZOMIG-ZMT) 2.5 MG disintegrating tablet, TAKE 1 TABLET (2.5 MG TOTAL) BY MOUTH AS NEEDED FOR MIGRAINE., Disp: 10 tablet, Rfl: 3 .  zonisamide (ZONEGRAN) 100 MG capsule, Take 3qhs (must sched appt for future fills), Disp: 90 capsule, Rfl: 3  No Known Allergies  Objective:  BP 113/68   Pulse 86   VITALS: Per patient  if applicable, see vitals. GENERAL: Alert, appears well and in no acute distress. HEENT: Atraumatic, conjunctiva clear, no obvious abnormalities on inspection of external nose and ears. NECK: Normal movements of the head and neck. CARDIOPULMONARY: No increased WOB. Speaking in clear sentences. I:E ratio WNL.  MS: Moves all visible extremities without noticeable abnormality. PSYCH: Pleasant and cooperative, well-groomed. Speech normal rate and rhythm. Affect is appropriate. Insight and judgement are appropriate. Attention is focused, linear, and appropriate.  NEURO: CN grossly intact. Oriented as arrived to appointment on time with no prompting. Moves both UE equally.  SKIN: No obvious lesions, wounds, erythema, or cyanosis noted on face or hands.  Depression screen [facility] 2/9 04/16/2018 07/08/2017 05/31/2016  Decreased Interest 0 0 0  Down, Depressed, Hopeless 0 0 0  PHQ - 2 Score 0 0 0    Assessment and Plan:   Christine Daniels was seen today for follow-up.  Diagnoses and all orders for this visit:  Essential hypertension  History of migraine headaches  Other orders -     zonisamide (ZONEGRAN) 100 MG capsule; Take 3qhs (must sched appt for future fills)    . COVID-19 Education: The signs and symptoms of COVID-19 were discussed with the patient and how to seek care for testing if needed. The importance of social distancing was discussed today. . Reviewed expectations re: course of current medical issues. . Discussed self-management of symptoms. . Outlined signs and symptoms indicating need for more acute intervention. . Patient verbalized understanding and all questions were answered. Marland Kitchen Health Maintenance issues including appropriate healthy diet, exercise, and smoking avoidance were discussed with patient. . See orders for this visit as documented in the electronic medical record.  Arnette Norris, MD  Records requested if needed. Time spent: 15 minutes, of which >50% was spent in obtaining  information about her symptoms, reviewing her previous labs, evaluations, and treatments, counseling her about her condition (please see the discussed topics above), and developing a plan to further investigate it; she had a number of questions which I addressed.

## 2019-02-28 ENCOUNTER — Ambulatory Visit (INDEPENDENT_AMBULATORY_CARE_PROVIDER_SITE_OTHER): Payer: Medicare Other | Admitting: Family Medicine

## 2019-02-28 VITALS — BP 113/68 | HR 86

## 2019-02-28 DIAGNOSIS — I10 Essential (primary) hypertension: Secondary | ICD-10-CM | POA: Diagnosis not present

## 2019-02-28 DIAGNOSIS — Z8669 Personal history of other diseases of the nervous system and sense organs: Secondary | ICD-10-CM

## 2019-02-28 MED ORDER — ZONISAMIDE 100 MG PO CAPS: ORAL_CAPSULE | ORAL | 3 refills | Status: DC

## 2019-02-28 NOTE — Assessment & Plan Note (Signed)
Well controlled.  Follow up Dr. Stanford Breed.  No changes made today.

## 2019-02-28 NOTE — Assessment & Plan Note (Signed)
Doing very well with Zonegran as preventative therapy with as needed zomig (rarely uses). eRx refilled, no changes made to current rxs. She will make an appt for CPX in August or September.

## 2019-03-04 DIAGNOSIS — D044 Carcinoma in situ of skin of scalp and neck: Secondary | ICD-10-CM | POA: Diagnosis not present

## 2019-03-04 DIAGNOSIS — D0422 Carcinoma in situ of skin of left ear and external auricular canal: Secondary | ICD-10-CM | POA: Diagnosis not present

## 2019-03-30 ENCOUNTER — Telehealth: Payer: Self-pay | Admitting: Family Medicine

## 2019-03-30 DIAGNOSIS — Z6820 Body mass index (BMI) 20.0-20.9, adult: Secondary | ICD-10-CM | POA: Diagnosis not present

## 2019-03-30 DIAGNOSIS — Z1231 Encounter for screening mammogram for malignant neoplasm of breast: Secondary | ICD-10-CM | POA: Diagnosis not present

## 2019-03-30 LAB — HM MAMMOGRAPHY

## 2019-03-30 NOTE — Telephone Encounter (Signed)
Tried to call pt about message sent in:   Appointment Request From: Christine Daniels    With Provider: Arnette Norris, Christine Daniels [[facility]]    Preferred Date Range: Any    Preferred Times: Any Time    Reason for visit: Request an Appointment    Comments:  a yearly visit to be scheduled with Dr Christine Daniels- Whenever she is available in Sept ( my cell # [phone] )realize that may not be available( so Oct) in office if you are doing those  Thanks Christine Daniels   Had to leave a message

## 2019-04-03 NOTE — Telephone Encounter (Signed)
Tried to call again and Husband answered and said she was at work and that he would get her to call back

## 2019-04-15 DIAGNOSIS — Z85828 Personal history of other malignant neoplasm of skin: Secondary | ICD-10-CM | POA: Diagnosis not present

## 2019-04-15 DIAGNOSIS — Z08 Encounter for follow-up examination after completed treatment for malignant neoplasm: Secondary | ICD-10-CM | POA: Diagnosis not present

## 2019-04-16 ENCOUNTER — Encounter: Payer: Self-pay | Admitting: Family Medicine

## 2019-04-16 NOTE — Progress Notes (Signed)
[facility]/thx dmf

## 2019-04-29 DIAGNOSIS — H2513 Age-related nuclear cataract, bilateral: Secondary | ICD-10-CM | POA: Diagnosis not present

## 2019-05-16 ENCOUNTER — Other Ambulatory Visit: Payer: Self-pay | Admitting: Family Medicine

## 2019-05-17 NOTE — Progress Notes (Signed)
HPI: FU hypertension; also with cerebrovascular disease followed by vascular surgery. Since last seen,the patient denies any dyspnea on exertion, orthopnea, PND, pedal edema, palpitations, syncope or chest pain.   Current Outpatient Medications  Medication Sig Dispense Refill  . aspirin 81 MG tablet Take 81 mg by mouth daily.      . Coenzyme Q10 (CO Q10) 100 MG TABS Take 1 tablet by mouth every other day.     . loratadine-pseudoephedrine (CLARITIN-D 12-HOUR) 5-120 MG per tablet Take 1 tablet by mouth daily as needed.     . metoprolol succinate (TOPROL-XL) 25 MG 24 hr tablet TAKE 1 TABLET BY MOUTH EVERY DAY 90 tablet 1  . potassium chloride SA (KLOR-CON M20) 20 MEQ tablet Take 1 tablet (20 mEq total) by mouth every other day. 45 tablet 9  . rosuvastatin (CRESTOR) 5 MG tablet Take 1 tablet (5 mg total) by mouth every other day. 90 tablet 3  . triamterene-hydrochlorothiazide (MAXZIDE) 75-50 MG per tablet Take 1 tablet by mouth daily. 100 tablet 3  . zolmitriptan (ZOMIG-ZMT) 2.5 MG disintegrating tablet TAKE 1 TABLET (2.5 MG TOTAL) BY MOUTH AS NEEDED FOR MIGRAINE. 10 tablet 3  . zonisamide (ZONEGRAN) 100 MG capsule Take 3qhs (must sched appt for future fills) 90 capsule 3   No current facility-administered medications for this visit.      Past Medical History:  Diagnosis Date  . Arthritis   . Carotid artery occlusion   . CEREBROVASCULAR DISEASE   . HYPERLIPIDEMIA   . Hypertension   . Migraine     Past Surgical History:  Procedure Laterality Date  . ABDOMINAL HYSTERECTOMY  2010  . BREAST BIOPSY  1987  . Status post bilateral tubal ligation.      Social History   Socioeconomic History  . Marital status: Married    Spouse name: Not on file  . Number of children: Not on file  . Years of education: Not on file  . Highest education level: Not on file  Occupational History  . Not on file  Social Needs  . Financial resource strain: Not on file  . Food insecurity   Worry: Not on file    Inability: Not on file  . Transportation needs    Medical: Not on file    Non-medical: Not on file  Tobacco Use  . Smoking status: Former Smoker    Types: Cigarettes    Quit date: 09/10/1977    Years since quitting: 41.7  . Smokeless tobacco: Never Used  Substance and Sexual Activity  . Alcohol use: Yes    Alcohol/week: 1.0 standard drinks    Types: 1 Standard drinks or equivalent per week  . Drug use: No  . Sexual activity: Yes  Lifestyle  . Physical activity    Days per week: Not on file    Minutes per session: Not on file  . Stress: Not on file  Relationships  . Social Herbalist on phone: Not on file    Gets together: Not on file    Attends religious service: Not on file    Active member of club or organization: Not on file    Attends meetings of clubs or organizations: Not on file    Relationship status: Not on file  . Intimate partner violence    Fear of current or ex partner: Not on file    Emotionally abused: Not on file    Physically abused: Not on file  Forced sexual activity: Not on file  Other Topics Concern  . Not on file  Social History Narrative   She has remote history of minimal tobacco use, but has     not smoked in years.  She rarely consumes alcohol.    Desires CPR    Family History  Problem Relation Age of Onset  . Cancer Mother 10       Breast  . Hyperlipidemia Father   . Stroke Father 2    ROS: no fevers or chills, productive cough, hemoptysis, dysphasia, odynophagia, melena, hematochezia, dysuria, hematuria, rash, seizure activity, orthopnea, PND, pedal edema, claudication. Remaining systems are negative.  Physical Exam: Well-developed well-nourished in no acute distress.  Skin is warm and dry.  HEENT is normal.  Neck is supple.  Chest is clear to auscultation with normal expansion.  Cardiovascular exam is regular rate and rhythm.  Abdominal exam nontender or distended. No masses palpated.  Extremities show no edema. neuro grossly intact  ECG-sinus rhythm with occasional PAC and PVC.  Personally reviewed  A/P  1 hypertension-patient's blood pressure is mildly elevated today but she states typically controlled.  Continue present medications and follow.  2 carotid artery disease-followed by vascular surgery.  Continue aspirin and statin.  3 hyperlipidemia-continue statin.  Lipids and liver monitored by primary care.  Kirk Ruths, MD

## 2019-05-19 ENCOUNTER — Encounter: Payer: Self-pay | Admitting: Cardiology

## 2019-05-19 ENCOUNTER — Ambulatory Visit (INDEPENDENT_AMBULATORY_CARE_PROVIDER_SITE_OTHER): Payer: Medicare Other | Admitting: Cardiology

## 2019-05-19 ENCOUNTER — Other Ambulatory Visit: Payer: Self-pay

## 2019-05-19 VITALS — BP 148/78 | HR 80 | Temp 97.6°F | Ht 69.0 in | Wt 145.2 lb

## 2019-05-19 DIAGNOSIS — I10 Essential (primary) hypertension: Secondary | ICD-10-CM | POA: Diagnosis not present

## 2019-05-19 DIAGNOSIS — E7800 Pure hypercholesterolemia, unspecified: Secondary | ICD-10-CM | POA: Diagnosis not present

## 2019-05-19 DIAGNOSIS — I6529 Occlusion and stenosis of unspecified carotid artery: Secondary | ICD-10-CM

## 2019-05-19 MED ORDER — POTASSIUM CHLORIDE CRYS ER 20 MEQ PO TBCR: 20.0000 meq | EXTENDED_RELEASE_TABLET | ORAL | 9 refills | Status: DC

## 2019-05-19 MED ORDER — ROSUVASTATIN CALCIUM 5 MG PO TABS: 5.0000 mg | ORAL_TABLET | ORAL | 3 refills | Status: DC

## 2019-05-19 NOTE — Patient Instructions (Signed)

## 2019-05-24 DIAGNOSIS — M8580 Other specified disorders of bone density and structure, unspecified site: Secondary | ICD-10-CM | POA: Insufficient documentation

## 2019-05-24 NOTE — Progress Notes (Signed)
Subjective:   Patient ID: Christine Daniels, female    DOB: [birth year], 73 y.o.   MRN: [id number]  Christine Daniels is a very pleasant 73 y.o. year old female who presents to clinic today with Annual Exam and Follow-up  on 05/27/2019  HPI:  Health Maintenance  Topic Date Due   INFLUENZA VACCINE  04/11/2019   MAMMOGRAM  03/29/2021   TETANUS/TDAP  02/10/2022   COLONOSCOPY  02/09/2028   DEXA SCAN  Completed   Hepatitis C Screening  Completed   PNA vac Low Risk Adult  Completed   Depression screen [facility] 2/9 05/27/2019 04/16/2018 07/08/2017 05/31/2016  Decreased Interest 0 0 0 0  Down, Depressed, Hopeless 0 0 0 0  PHQ - 2 Score 0 0 0 0   Still working and feels safe at work.  Has GYN- Dr. Stann Mainland.  Remote h/o hysterectomy. Mammogram done 03/30/19.  Saw Dr. Stanford Breed on 05/19/19.  Note reviewed. She has h/o carotid artery disease s/p vascular surgery and HTN. Advised to continue Maxzide 75-50 mg daily, Klor Con 20 meq every other day, and Toprol XL 25 mg daily.  Also  Continue ASA 81 mg daily and Crestor 5 mg daily.   Carotid stenosis- last Caroid doppler done on 10/11/16 which showed no significant change from 2016.  They decided she was due was in 2021.  HLD- on crestor 5 mg every other day  Lab Results  Component Value Date   CHOL 156 02/19/2018   HDL 42.30 02/19/2018   LDLCALC 90 02/19/2018   LDLDIRECT 166.0 05/31/2016   TRIG 116.0 02/19/2018   CHOLHDL 4 02/19/2018   Lab Results  Component Value Date   ALT 15 02/19/2018   AST 21 02/19/2018   ALKPHOS 75 02/19/2018   BILITOT 0.8 02/19/2018    Migraines- I last saw her for this on 02/28/19 via virtual visit.  Note reviewed. She continues to do very well with Zonegran as preventative therapy with as needed zomig (rarely uses).  These have improved even more than when I last saw her June.  Osteopenia- last DEXA done in January 2018.  She is very active. ?DEXA at [place] to see derm in a couple of months.  Current  Outpatient Medications on File Prior to Visit  Medication Sig Dispense Refill   aspirin 81 MG tablet Take 81 mg by mouth daily.       Coenzyme Q10 (CO Q10) 100 MG TABS Take 1 tablet by mouth every other day.      loratadine-pseudoephedrine (CLARITIN-D 12-HOUR) 5-120 MG per tablet Take 1 tablet by mouth daily as needed.      metoprolol succinate (TOPROL-XL) 25 MG 24 hr tablet TAKE 1 TABLET BY MOUTH EVERY DAY 90 tablet 1   potassium chloride SA (KLOR-CON M20) 20 MEQ tablet Take 1 tablet (20 mEq total) by mouth every other day. 45 tablet 9   rosuvastatin (CRESTOR) 5 MG tablet Take 1 tablet (5 mg total) by mouth every other day. 90 tablet 3   triamterene-hydrochlorothiazide (MAXZIDE) 75-50 MG per tablet Take 1 tablet by mouth daily. 100 tablet 3   zolmitriptan (ZOMIG-ZMT) 2.5 MG disintegrating tablet TAKE 1 TABLET (2.5 MG TOTAL) BY MOUTH AS NEEDED FOR MIGRAINE. 10 tablet 3   zonisamide (ZONEGRAN) 100 MG capsule Take 3qhs (must sched appt for future fills) 90 capsule 3   No current facility-administered medications on file prior to visit.     No Known Allergies  Past Medical History:  Diagnosis Date  Arthritis    Carotid artery occlusion    CEREBROVASCULAR DISEASE    HYPERLIPIDEMIA    Hypertension    Migraine     Past Surgical History:  Procedure Laterality Date   ABDOMINAL HYSTERECTOMY  2010   BREAST BIOPSY  1987   Status post bilateral tubal ligation.      Family History  Problem Relation Age of Onset   Cancer Mother 11       Breast   Hyperlipidemia Father    Stroke Father 46    Social History   Socioeconomic History   Marital status: Married    Spouse name: Not on file   Number of children: Not on file   Years of education: Not on file   Highest education level: Not on file  Occupational History   Not on file  Social Needs   Financial resource strain: Not on file   Food insecurity    Worry: Not on file    Inability: Not on file    Transportation needs    Medical: Not on file    Non-medical: Not on file  Tobacco Use   Smoking status: Former Smoker    Types: Cigarettes    Quit date: 09/10/1977    Years since quitting: 41.7   Smokeless tobacco: Never Used  Substance and Sexual Activity   Alcohol use: Yes    Alcohol/week: 1.0 standard drinks    Types: 1 Standard drinks or equivalent per week   Drug use: No   Sexual activity: Yes  Lifestyle   Physical activity    Days per week: Not on file    Minutes per session: Not on file   Stress: Not on file  Relationships   Social connections    Talks on phone: Not on file    Gets together: Not on file    Attends religious service: Not on file    Active member of club or organization: Not on file    Attends meetings of clubs or organizations: Not on file    Relationship status: Not on file   Intimate partner violence    Fear of current or ex partner: Not on file    Emotionally abused: Not on file    Physically abused: Not on file    Forced sexual activity: Not on file  Other Topics Concern   Not on file  Social History Narrative   She has remote history of minimal tobacco use, but has     not smoked in years.  She rarely consumes alcohol.    Desires CPR   The PMH, PSH, Social History, Family History, Medications, and allergies have been reviewed in [facility], and have been updated if relevant.   Review of Systems  Constitutional: Negative.   HENT: Negative.   Respiratory: Negative.   Cardiovascular: Negative.  Negative for chest pain.  Gastrointestinal: Negative.   Endocrine: Negative.   Genitourinary: Negative.   Musculoskeletal: Negative.   Allergic/Immunologic: Negative.   Neurological: Negative.   Hematological: Negative.   Psychiatric/Behavioral: Negative.   All other systems reviewed and are negative.        Objective:    BP 128/80    Pulse 83    Ht 5\' 9"  (1.753 m)    Wt 145 lb (65.8 kg)    SpO2 97%    BMI 21.41 kg/m    Physical  Exam    General:  Well-developed,well-nourished,in no acute distress; alert,appropriate and cooperative throughout examination Head:  normocephalic and atraumatic.  Eyes:  vision grossly intact, PERRL Ears:  R ear normal and L ear normal externally, TMs clear bilaterally Nose:  no external deformity.   Mouth:  good dentition.   Neck:  No deformities, masses, or tenderness noted. Breasts:  No mass, nodules, thickening, tenderness, bulging, retraction, inflamation, nipple discharge or skin changes noted.   Lungs:  Normal respiratory effort, chest expands symmetrically. Lungs are clear to auscultation, no crackles or wheezes. Heart:  Normal rate and regular rhythm. S1 and S2 normal without gallop, murmur, click, rub or other extra sounds. Abdomen:  Bowel sounds positive,abdomen soft and non-tender without masses, organomegaly or hernias noted. Msk:  No deformity or scoliosis noted of thoracic or lumbar spine.   Extremities:  No clubbing, cyanosis, edema, or deformity noted with normal full range of motion of all joints.   Neurologic:  alert & oriented X3 and gait normal.   Skin:  Intact without suspicious lesions or rashes Cervical Nodes:  No lymphadenopathy noted Axillary Nodes:  No palpable lymphadenopathy Psych:  Cognition and judgment appear intact. Alert and cooperative with normal attention span and concentration. No apparent delusions, illusions, hallucinations       Assessment & Plan:   Well woman exam without gynecological exam  Essential hypertension  Hyperlipidemia, unspecified hyperlipidemia type - Plan: Lipid panel, Comprehensive metabolic panel, CBC with Differential/Platelet, TSH, Lipid panel  HYPOKALEMIA  CEREBROVASCULAR DISEASE  Stenosis of carotid artery, unspecified laterality  Osteopenia of neck of femur, unspecified laterality - Plan: Vitamin D (25 hydroxy)  Carotid artery disease, unspecified laterality ([place]), Chronic No follow-ups on file.

## 2019-05-26 ENCOUNTER — Encounter: Payer: Self-pay | Admitting: Family Medicine

## 2019-05-26 ENCOUNTER — Other Ambulatory Visit: Payer: Self-pay

## 2019-05-27 ENCOUNTER — Encounter: Payer: Self-pay | Admitting: Family Medicine

## 2019-05-27 ENCOUNTER — Ambulatory Visit (INDEPENDENT_AMBULATORY_CARE_PROVIDER_SITE_OTHER): Payer: Medicare Other | Admitting: Family Medicine

## 2019-05-27 VITALS — BP 128/80 | HR 83 | Ht 69.0 in | Wt 145.0 lb

## 2019-05-27 DIAGNOSIS — G43109 Migraine with aura, not intractable, without status migrainosus: Secondary | ICD-10-CM

## 2019-05-27 DIAGNOSIS — I10 Essential (primary) hypertension: Secondary | ICD-10-CM | POA: Diagnosis not present

## 2019-05-27 DIAGNOSIS — M85859 Other specified disorders of bone density and structure, unspecified thigh: Secondary | ICD-10-CM

## 2019-05-27 DIAGNOSIS — I779 Disorder of arteries and arterioles, unspecified: Secondary | ICD-10-CM

## 2019-05-27 DIAGNOSIS — E785 Hyperlipidemia, unspecified: Secondary | ICD-10-CM

## 2019-05-27 DIAGNOSIS — G43009 Migraine without aura, not intractable, without status migrainosus: Secondary | ICD-10-CM | POA: Insufficient documentation

## 2019-05-27 DIAGNOSIS — I739 Peripheral vascular disease, unspecified: Secondary | ICD-10-CM

## 2019-05-27 DIAGNOSIS — Z23 Encounter for immunization: Secondary | ICD-10-CM | POA: Diagnosis not present

## 2019-05-27 DIAGNOSIS — I6529 Occlusion and stenosis of unspecified carotid artery: Secondary | ICD-10-CM

## 2019-05-27 DIAGNOSIS — E876 Hypokalemia: Secondary | ICD-10-CM

## 2019-05-27 DIAGNOSIS — Z0000 Encounter for general adult medical examination without abnormal findings: Secondary | ICD-10-CM | POA: Diagnosis not present

## 2019-05-27 DIAGNOSIS — I679 Cerebrovascular disease, unspecified: Secondary | ICD-10-CM | POA: Diagnosis not present

## 2019-05-27 DIAGNOSIS — G43909 Migraine, unspecified, not intractable, without status migrainosus: Secondary | ICD-10-CM | POA: Insufficient documentation

## 2019-05-27 LAB — CBC WITH DIFFERENTIAL/PLATELET
Basophils Absolute: 0.1 10*3/uL (ref 0.0–0.1)
Basophils Relative: 0.7 % (ref 0.0–3.0)
Eosinophils Absolute: 0.2 10*3/uL (ref 0.0–0.7)
Eosinophils Relative: 1.7 % (ref 0.0–5.0)
HCT: 42.2 % (ref 36.0–46.0)
Hemoglobin: 14 g/dL (ref 12.0–15.0)
Lymphocytes Relative: 21.4 % (ref 12.0–46.0)
Lymphs Abs: 2 10*3/uL (ref 0.7–4.0)
MCHC: 33.1 g/dL (ref 30.0–36.0)
MCV: 93.5 fl (ref 78.0–100.0)
Monocytes Absolute: 1 10*3/uL (ref 0.1–1.0)
Monocytes Relative: 10.1 % (ref 3.0–12.0)
Neutro Abs: 6.3 10*3/uL (ref 1.4–7.7)
Neutrophils Relative %: 66.1 % (ref 43.0–77.0)
Platelets: 303 10*3/uL (ref 150.0–400.0)
RBC: 4.51 Mil/uL (ref 3.87–5.11)
RDW: 12.8 % (ref 11.5–15.5)
WBC: 9.5 10*3/uL (ref 4.0–10.5)

## 2019-05-27 LAB — COMPREHENSIVE METABOLIC PANEL
ALT: 16 U/L (ref 0–35)
AST: 20 U/L (ref 0–37)
Albumin: 4.2 g/dL (ref 3.5–5.2)
Alkaline Phosphatase: 81 U/L (ref 39–117)
BUN: 30 mg/dL — ABNORMAL HIGH (ref 6–23)
CO2: 33 mEq/L — ABNORMAL HIGH (ref 19–32)
Calcium: 10.2 mg/dL (ref 8.4–10.5)
Chloride: 97 mEq/L (ref 96–112)
Creatinine, Ser: 1.08 mg/dL (ref 0.40–1.20)
GFR: 49.81 mL/min — ABNORMAL LOW (ref >60.00)
Glucose, Bld: 100 mg/dL — ABNORMAL HIGH (ref 70–99)
Potassium: 3.2 mEq/L — ABNORMAL LOW (ref 3.5–5.1)
Sodium: 140 mEq/L (ref 135–145)
Total Bilirubin: 0.8 mg/dL (ref 0.2–1.2)
Total Protein: 6.8 g/dL (ref 6.0–8.3)

## 2019-05-27 LAB — VITAMIN D 25 HYDROXY (VIT D DEFICIENCY, FRACTURES): VITD: 71.58 ng/mL (ref 30.00–100.00)

## 2019-05-27 LAB — LIPID PANEL
Cholesterol: 159 mg/dL (ref 0–200)
HDL: 43.1 mg/dL (ref >39.00)
LDL Cholesterol: 91 mg/dL (ref 0–99)
NonHDL: 115.5
Total CHOL/HDL Ratio: 4
Triglycerides: 125 mg/dL (ref 0.0–149.0)
VLDL: 25 mg/dL (ref 0.0–40.0)

## 2019-05-27 LAB — TSH: TSH: 3.58 u[IU]/mL (ref 0.35–4.50)

## 2019-05-27 NOTE — Patient Instructions (Addendum)
Great to see you, Christine Daniels.  I will call you with your lab results from today and you can view them online. \  Congratulations on the new baby!!!  Please call the breast center at [phone] to schedule your bone density at your convenience.

## 2019-05-27 NOTE — Assessment & Plan Note (Signed)
Doing well on preventative and abortive therapy. No changes made.

## 2019-05-27 NOTE — Addendum Note (Signed)
Addended by: Rodrigo Ran on: 05/27/2019 10:23 AM   Modules accepted: Orders

## 2019-05-27 NOTE — Assessment & Plan Note (Signed)
Doing well on crestor. Labs today. Orders Placed This Encounter  Procedures  . DG Bone Density  . Lipid panel  . Comprehensive metabolic panel  . CBC with Differential/Platelet  . TSH  . Vitamin D (25 hydroxy)

## 2019-05-27 NOTE — Assessment & Plan Note (Signed)
Getting exercise, calcium and vitamin D. DEXA ordered and phone number for breast center given to pt to schedule her own mammogram.

## 2019-05-27 NOTE — Assessment & Plan Note (Signed)
Due for dopplers next year per cardiology.

## 2019-05-27 NOTE — Assessment & Plan Note (Signed)
Reviewed preventive care protocols, scheduled due services, and updated immunizations Discussed nutrition, exercise, diet, and healthy lifestyle.  

## 2019-05-27 NOTE — Assessment & Plan Note (Signed)
Followed by cardiology. No changes made to rxs.

## 2019-05-30 ENCOUNTER — Other Ambulatory Visit: Payer: Self-pay | Admitting: Family Medicine

## 2019-06-01 NOTE — Telephone Encounter (Signed)
Dr. Deborra Medina please advise last OV 9/16 last refill 6/20 #90 3 refills

## 2019-06-10 ENCOUNTER — Other Ambulatory Visit: Payer: Self-pay | Admitting: Cardiology

## 2019-09-02 ENCOUNTER — Other Ambulatory Visit: Payer: Self-pay

## 2019-09-02 NOTE — Patient Outreach (Signed)
[place] [facility]) Care Management  09/02/2019  Christine Daniels 11/05/1945 [id number]   Medication Adherence call to Christine Daniels Compliant Voice message left with a call back number. Christine Daniels is showing past due on Rosuvastatin 5 mg under [place].   [place] Management Direct Dial [phone]  Fax [phone] [email or web address]

## 2019-09-08 ENCOUNTER — Other Ambulatory Visit: Payer: Self-pay

## 2019-09-08 NOTE — Patient Outreach (Signed)
[place] [facility]) Care Management  09/08/2019  Christine Daniels [birth year] [id number]   Medication Adherence call to Christine Daniels Compliant Voice message left with a call back number. Christine Daniels is showing past due on Rosuvastatin 5 mg under [employer].   [place] Management Direct Dial [phone]  Fax [phone] [email or web address]

## 2019-09-15 ENCOUNTER — Other Ambulatory Visit: Payer: Self-pay

## 2019-09-15 NOTE — Patient Outreach (Signed)
[place] [facility]) Care Management  09/15/2019  DILCIA CLEVERLY 04/01/1946 [id number]   Medication Adherence call to Mrs. [place] Compliant Voice message left with a call back number. Mrs. [place] is showing past due on Rosuvastatin 5 mg under [place].   [place] Management Direct Dial [phone]  Fax [phone] [email or web address]

## 2019-09-29 ENCOUNTER — Encounter: Payer: Self-pay | Admitting: Family Medicine

## 2019-10-05 NOTE — Progress Notes (Unsigned)
Virtual Visit via Video   Due to the COVID-19 pandemic, this visit was completed with telemedicine (audio/video) technology to reduce patient and provider exposure as well as to preserve personal protective equipment.   I connected with Christine Daniels by a video enabled telemedicine application and verified that I am speaking with the correct person using two identifiers. Location patient: Home Location provider: [facility], Office Persons participating in the virtual visit: Envi W Dorothyann Gibbs, MD   I discussed the limitations of evaluation and management by telemedicine and the availability of in person appointments. The patient expressed understanding and agreed to proceed.  Care Team   Patient Care Team: Lucille Passy, MD as PCP - General (Family Medicine) Orie Rout, MD as Referring Physician (Specialist) Suella Broad, MD (Physical Medicine and Rehabilitation) Stanford Breed Denice Bors, MD as Consulting Physician (Cardiology) Angelia Mould, MD as Consulting Physician (Vascular Surgery) Vania Rea, MD as Consulting Physician (Obstetrics and Gynecology) Levy Sjogren, MD as Referring Physician (Dermatology)  Subjective:   HPI: Patient is needing to F/U with medications.  Currently takes Metoprolol ER and Maxzide for HTN and is compliant.  She also takes Crestor for HLD. On 9.16.20 labs looked good but K being slightly low at 3.2 otherwise all well controlled.  Review of Systems  Constitutional: Negative for fever.  HENT: Negative for congestion and hearing loss.   Eyes: Negative for blurred vision and discharge.  Respiratory: Negative for cough and shortness of breath.   Cardiovascular: Negative for chest pain.  Gastrointestinal: Negative for heartburn.  Genitourinary: Negative for dysuria.  Musculoskeletal: Negative for falls and myalgias.  Skin: Negative for rash.  Neurological: Negative for dizziness and loss of consciousness.    Endo/Heme/Allergies: Does not bruise/bleed easily.  Psychiatric/Behavioral: Negative for depression and memory loss.     Patient Active Problem List   Diagnosis Date Noted  . Migraine   . Osteopenia 05/24/2019  . Degeneration of lumbar intervertebral disc 11/04/2017  . DDD (degenerative disc disease), cervical 10/11/2017  . Well woman exam without gynecological exam 05/31/2016  . Carotid stenosis 08/13/2012  . History of migraine headaches 02/11/2012  . HYPOKALEMIA 11/03/2009  . Essential hypertension 11/03/2009  . Carotid artery disease, unspecified laterality ([place]) 11/03/2009  . HLD (hyperlipidemia) 12/30/2008    Social History   Tobacco Use  . Smoking status: Former Smoker    Types: Cigarettes    Quit date: 09/10/1977    Years since quitting: 42.0  . Smokeless tobacco: Never Used  Substance Use Topics  . Alcohol use: Yes    Alcohol/week: 1.0 standard drinks    Types: 1 Standard drinks or equivalent per week    Current Outpatient Medications:  .  aspirin 81 MG tablet, Take 81 mg by mouth daily.  , Disp: , Rfl:  .  Coenzyme Q10 (CO Q10) 100 MG TABS, Take 1 tablet by mouth every other day. , Disp: , Rfl:  .  loratadine-pseudoephedrine (CLARITIN-D 12-HOUR) 5-120 MG per tablet, Take 1 tablet by mouth daily as needed. , Disp: , Rfl:  .  metoprolol succinate (TOPROL-XL) 25 MG 24 hr tablet, TAKE 1 TABLET BY MOUTH EVERY DAY, Disp: 90 tablet, Rfl: 3 .  potassium chloride SA (KLOR-CON M20) 20 MEQ tablet, Take 1 tablet (20 mEq total) by mouth every other day., Disp: 45 tablet, Rfl: 9 .  rosuvastatin (CRESTOR) 5 MG tablet, Take 1 tablet (5 mg total) by mouth every other day., Disp: 90 tablet, Rfl: 3 .  triamterene-hydrochlorothiazide (  MAXZIDE) 75-50 MG per tablet, Take 1 tablet by mouth daily., Disp: 100 tablet, Rfl: 3 .  zolmitriptan (ZOMIG-ZMT) 2.5 MG disintegrating tablet, TAKE 1 TABLET (2.5 MG TOTAL) BY MOUTH AS NEEDED FOR MIGRAINE., Disp: 10 tablet, Rfl: 3 .  zonisamide (ZONEGRAN)  100 MG capsule, TAKE 3 CAPSULES AT BEDTIME (MUST SCHED APPT FOR FUTURE FILLS), Disp: 270 capsule, Rfl: 1  No Known Allergies  Objective:   VITALS: Per patient if applicable, see vitals. GENERAL: Alert, appears well and in no acute distress. HEENT: Atraumatic, conjunctiva clear, no obvious abnormalities on inspection of external nose and ears. NECK: Normal movements of the head and neck. CARDIOPULMONARY: No increased WOB. Speaking in clear sentences. I:E ratio WNL.  MS: Moves all visible extremities without noticeable abnormality. PSYCH: Pleasant and cooperative, well-groomed. Speech normal rate and rhythm. Affect is appropriate. Insight and judgement are appropriate. Attention is focused, linear, and appropriate.  NEURO: CN grossly intact. Oriented as arrived to appointment on time with no prompting. Moves both UE equally.  SKIN: No obvious lesions, wounds, erythema, or cyanosis noted on face or hands.  Depression screen [facility] 2/9 05/27/2019 04/16/2018 07/08/2017  Decreased Interest 0 0 0  Down, Depressed, Hopeless 0 0 0  PHQ - 2 Score 0 0 0     . COVID-19 Education: The signs and symptoms of COVID-19 were discussed with the patient and how to seek care for testing if needed. The importance of social distancing was discussed today. . Reviewed expectations re: course of current medical issues. . Discussed self-management of symptoms. . Outlined signs and symptoms indicating need for more acute intervention. . Patient verbalized understanding and all questions were answered. Marland Kitchen Health Maintenance issues including appropriate healthy diet, exercise, and smoking avoidance were discussed with patient. . See orders for this visit as documented in the electronic medical record.  Arnette Norris, MD  Records requested if needed. Time spent: *** minutes, of which >50% was spent in obtaining information about her symptoms, reviewing her previous labs, evaluations, and treatments, counseling her about her  condition (please see the discussed topics above), and developing a plan to further investigate it; she had a number of questions which I addressed.   Lab Results  Component Value Date   WBC 9.5 05/27/2019   HGB 14.0 05/27/2019   HCT 42.2 05/27/2019   PLT 303.0 05/27/2019   GLUCOSE 100 (H) 05/27/2019   CHOL 159 05/27/2019   TRIG 125.0 05/27/2019   HDL 43.10 05/27/2019   LDLDIRECT 166.0 05/31/2016   LDLCALC 91 05/27/2019   ALT 16 05/27/2019   AST 20 05/27/2019   NA 140 05/27/2019   K 3.2 (L) 05/27/2019   CL 97 05/27/2019   CREATININE 1.08 05/27/2019   BUN 30 (H) 05/27/2019   CO2 33 (H) 05/27/2019   TSH 3.58 05/27/2019    Lab Results  Component Value Date   TSH 3.58 05/27/2019   Lab Results  Component Value Date   WBC 9.5 05/27/2019   HGB 14.0 05/27/2019   HCT 42.2 05/27/2019   MCV 93.5 05/27/2019   PLT 303.0 05/27/2019   Lab Results  Component Value Date   NA 140 05/27/2019   K 3.2 (L) 05/27/2019   CO2 33 (H) 05/27/2019   GLUCOSE 100 (H) 05/27/2019   BUN 30 (H) 05/27/2019   CREATININE 1.08 05/27/2019   BILITOT 0.8 05/27/2019   ALKPHOS 81 05/27/2019   AST 20 05/27/2019   ALT 16 05/27/2019   PROT 6.8 05/27/2019   ALBUMIN 4.2  05/27/2019   CALCIUM 10.2 05/27/2019   GFR 49.81 (L) 05/27/2019   Lab Results  Component Value Date   CHOL 159 05/27/2019   Lab Results  Component Value Date   HDL 43.10 05/27/2019   Lab Results  Component Value Date   LDLCALC 91 05/27/2019   Lab Results  Component Value Date   TRIG 125.0 05/27/2019   Lab Results  Component Value Date   CHOLHDL 4 05/27/2019   No results found for: HGBA1C     Assessment & Plan:   Problem List Items Addressed This Visit    None      I am having Christine Daniels" maintain her aspirin, Co Q10, triamterene-hydrochlorothiazide, loratadine-pseudoephedrine, zolmitriptan, potassium chloride SA, rosuvastatin, zonisamide, and metoprolol succinate.  No orders of the defined types were  placed in this encounter.    Arnette Norris, MD

## 2019-10-06 ENCOUNTER — Encounter: Payer: Self-pay | Admitting: Family Medicine

## 2019-10-06 ENCOUNTER — Telehealth: Payer: Medicare Other | Admitting: Family Medicine

## 2019-10-10 ENCOUNTER — Encounter: Payer: Self-pay | Admitting: Family Medicine

## 2019-10-29 ENCOUNTER — Telehealth: Payer: Medicare Other | Admitting: Physician Assistant

## 2019-10-29 DIAGNOSIS — N3000 Acute cystitis without hematuria: Secondary | ICD-10-CM | POA: Diagnosis not present

## 2019-10-29 MED ORDER — CEPHALEXIN 500 MG PO CAPS: 500.0000 mg | ORAL_CAPSULE | Freq: Two times a day (BID) | ORAL | 0 refills | Status: DC

## 2019-10-29 NOTE — Progress Notes (Signed)
We are sorry that you are not feeling well.  Here is how we plan to help!  Based on what you shared with me it looks like you most likely have a simple urinary tract infection.  A UTI (Urinary Tract Infection) is a bacterial infection of the bladder.  Most cases of urinary tract infections are simple to treat but a key part of your care is to encourage you to drink plenty of fluids and watch your symptoms carefully.  I have prescribed Keflex 500 mg twice a day for 7 days.  Your symptoms should gradually improve. Call us if the burning in your urine worsens, you develop worsening fever, back pain or pelvic pain or if your symptoms do not resolve after completing the antibiotic.  Urinary tract infections can be prevented by drinking plenty of water to keep your body hydrated.  Also be sure when you wipe, wipe from front to back and don't hold it in!  If possible, empty your bladder every 4 hours.  Your e-visit answers were reviewed by a board certified advanced clinical practitioner to complete your personal care plan.  Depending on the condition, your plan could have included both over the counter or prescription medications.  If there is a problem please reply  once you have received a response from your provider.  Your safety is important to [place].  If you have drug allergies check your prescription carefully.    You can use MyChart to ask questions about today's visit, request a non-urgent call back, or ask for a work or school excuse for 24 hours related to this e-Visit. If it has been greater than 24 hours you will need to follow up with your provider, or enter a new e-Visit to address those concerns.   You will get an e-mail in the next two days asking about your experience.  I hope that your e-visit has been valuable and will speed your recovery. Thank you for using e-visits.  Angle Eljay Lave PA-C  Approximately 5 minutes was spent documenting and reviewing patient's chart.

## 2019-11-24 ENCOUNTER — Other Ambulatory Visit: Payer: Self-pay

## 2019-11-24 MED ORDER — ZONISAMIDE 100 MG PO CAPS: ORAL_CAPSULE | ORAL | 0 refills | Status: DC

## 2019-12-20 ENCOUNTER — Telehealth: Payer: Medicare Other | Admitting: Family

## 2019-12-20 DIAGNOSIS — R399 Unspecified symptoms and signs involving the genitourinary system: Secondary | ICD-10-CM

## 2019-12-20 DIAGNOSIS — R109 Unspecified abdominal pain: Secondary | ICD-10-CM

## 2019-12-20 NOTE — Progress Notes (Signed)
Based on what you shared with me, I feel your condition warrants further evaluation and I recommend that you be seen for a face to face office visit.    Given you are having UTI symptoms with back pain you need to be seen face to face to rule out a more serious infection.    NOTE: If you entered your credit card information for this eVisit, you will not be charged. You may see a "hold" on your card for the $35 but that hold will drop off and you will not have a charge processed.   If you are having a true medical emergency please call 911.      For an urgent face to face visit, [place] has five urgent care centers for your convenience:      NEW:  [facility] [place] [facility] [place] Get Driving Directions [id number] [address] [place], [place] [address] . 10 am - 6pm Monday - Friday    [facility] Get Driving Directions [id number] [address] [place], [place] [address] . 10 am to 8 pm Monday-Friday . 12 pm to 8 pm [facility] Get Driving Directions [id number] [place], [place], [place] [address] . 8 am to 8 pm Monday-Friday . 9 am to 6 pm Saturday . 11 am to 6 pm Sunday     [facility] Get Driving Directions  [id number] [address] [place], [place] [address] . 8 am to 8 pm Monday-Friday . 8 am to 4 pm [facility] at [place] Get Driving Directions [id number] [address] [place],  [address] . 12 pm to 6 pm Monday-Friday      Your e-visit answers were reviewed by a board certified advanced clinical practitioner to complete your personal care plan.  Thank you for using e-Visits.

## 2020-01-02 ENCOUNTER — Encounter: Payer: Self-pay | Admitting: Family Medicine

## 2020-01-13 DIAGNOSIS — D0461 Carcinoma in situ of skin of right upper limb, including shoulder: Secondary | ICD-10-CM | POA: Diagnosis not present

## 2020-01-13 DIAGNOSIS — Z08 Encounter for follow-up examination after completed treatment for malignant neoplasm: Secondary | ICD-10-CM | POA: Diagnosis not present

## 2020-01-13 DIAGNOSIS — Z85828 Personal history of other malignant neoplasm of skin: Secondary | ICD-10-CM | POA: Diagnosis not present

## 2020-01-25 ENCOUNTER — Other Ambulatory Visit: Payer: Self-pay | Admitting: *Deleted

## 2020-01-25 DIAGNOSIS — I779 Disorder of arteries and arterioles, unspecified: Secondary | ICD-10-CM

## 2020-01-27 ENCOUNTER — Ambulatory Visit: Payer: Medicare Other | Admitting: Vascular Surgery

## 2020-01-27 ENCOUNTER — Encounter: Payer: Self-pay | Admitting: Vascular Surgery

## 2020-01-27 ENCOUNTER — Other Ambulatory Visit: Payer: Self-pay

## 2020-01-27 ENCOUNTER — Encounter (HOSPITAL_COMMUNITY): Payer: Medicare Other

## 2020-01-27 ENCOUNTER — Ambulatory Visit (HOSPITAL_COMMUNITY)
Admission: RE | Admit: 2020-01-27 | Discharge: 2020-01-27 | Disposition: A | Discharge status: Home / Self Care | Payer: Medicare Other | Source: Ambulatory Visit | Attending: Surgery | Admitting: Surgery

## 2020-01-27 VITALS — BP 160/89 | HR 81 | Temp 97.4°F | Resp 20 | Ht 69.0 in | Wt 147.0 lb

## 2020-01-27 DIAGNOSIS — I779 Disorder of arteries and arterioles, unspecified: Secondary | ICD-10-CM

## 2020-01-27 NOTE — Progress Notes (Signed)
REASON FOR VISIT:   Follow-up of carotid disease  MEDICAL ISSUES:   CAROTID DISEASE: This patient has had some elevated velocities in her distal internal carotid arteries possibly consistent with tortuosity or fibromuscular dysplasia.  She has had no significant stenosis noted on either side.  However she has been quite concerned about the velocities and therefore we have been following this on a routine basis since 1999.  She remains asymptomatic.  Given that the velocities today seem to have improved and again she has no evidence of significant stenosis she is agreeable to stretch her follow-up out of 5 years.  I have ordered a carotid duplex scan in 5 years and I will see her at that time.  We have reviewed the symptoms of cerebrovascular disease and she knows to call if she develops any new symptoms.  She is on aspirin and is on a statin.  She is not a smoker.  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  Her blood pressure typically is not elevated.    HPI:   Christine Daniels is a pleasant 74 y.o. female who I have been following since 1999 with carotid disease.  I last saw her in November 2012.  At that time she had elevated velocities in the mid to distal segments of the internal carotid arteries likely secondary to vessel tortuosity.  No significant carotid stenosis was noted.  The patient was last seen in our office by the nurse practitioner on 10/11/2016.  At that time there was no significant carotid stenosis.  There were some elevated velocities in the mid right ICA likely secondary to vessel tortuosity or possible fibromuscular dysplasia.  She was set up for a 3-year follow-up visit.  Since I saw her last she denies any history of stroke, TIAs, expressive or receptive aphasia, or amaurosis fugax.  She is not a smoker.  She is on aspirin and is on a statin.  Past Medical History:  Diagnosis  Date  . Arthritis   . Carotid artery occlusion   . CEREBROVASCULAR DISEASE   . HYPERLIPIDEMIA   . Hypertension   . Migraine     Family History  Problem Relation Age of Onset  . Cancer Mother 1       Breast  . Hyperlipidemia Father   . Stroke Father 72    SOCIAL HISTORY: Social History   Tobacco Use  . Smoking status: Former Smoker    Types: Cigarettes    Quit date: 09/10/1977    Years since quitting: 42.4  . Smokeless tobacco: Never Used  Substance Use Topics  . Alcohol use: Yes    Alcohol/week: 1.0 standard drinks    Types: 1 Standard drinks or equivalent per week    No Known Allergies  Current Outpatient Medications  Medication Sig Dispense Refill  . aspirin 81 MG tablet Take 81 mg by mouth daily.      . Coenzyme Q10 (CO Q10) 100 MG TABS Take 1 tablet by mouth every other day.     . loratadine-pseudoephedrine (CLARITIN-D 12-HOUR) 5-120 MG per tablet Take 1 tablet by mouth daily as needed.     . potassium chloride SA (KLOR-CON M20) 20 MEQ tablet Take 1 tablet (20 mEq total) by mouth every other day. 45 tablet 9  . rosuvastatin (CRESTOR) 5 MG tablet Take 1 tablet (5 mg total) by mouth every other day. 90 tablet 3  .  triamterene-hydrochlorothiazide (MAXZIDE) 75-50 MG per tablet Take 1 tablet by mouth daily. 100 tablet 3  . zolmitriptan (ZOMIG-ZMT) 2.5 MG disintegrating tablet TAKE 1 TABLET (2.5 MG TOTAL) BY MOUTH AS NEEDED FOR MIGRAINE. 10 tablet 3  . zonisamide (ZONEGRAN) 100 MG capsule Take 3qhs (plz sched appt with new provider) 270 capsule 0   No current facility-administered medications for this visit.    REVIEW OF SYSTEMS:  [X]  denotes positive finding, [ ]  denotes negative finding Cardiac  Comments:  Chest pain or chest pressure:    Shortness of breath upon exertion:    Short of breath when lying flat:    Irregular heart rhythm:        Vascular    Pain in calf, thigh, or hip brought on by ambulation:    Pain in feet at night that wakes you up from your  sleep:     Blood clot in your veins:    Leg swelling:         Pulmonary    Oxygen at home:    Productive cough:     Wheezing:         Neurologic    Sudden weakness in arms or legs:     Sudden numbness in arms or legs:     Sudden onset of difficulty speaking or slurred speech:    Temporary loss of vision in one eye:     Problems with dizziness:         Gastrointestinal    Blood in stool:     Vomited blood:         Genitourinary    Burning when urinating:     Blood in urine:        Psychiatric    Major depression:         Hematologic    Bleeding problems:    Problems with blood clotting too easily:        Skin    Rashes or ulcers:        Constitutional    Fever or chills:     PHYSICAL EXAM:   Vitals:   01/27/20 1432 01/27/20 1434  BP: (!) 171/90 (!) 160/89  Pulse: 81   Resp: 20   Temp: (!) 97.4 F (36.3 C)   SpO2: 98%   Weight: 147 lb (66.7 kg)   Height: 5\' 9"  (1.753 m)     GENERAL: The patient is a well-nourished female, in no acute distress. The vital signs are documented above. CARDIAC: There is a regular rate and rhythm.  VASCULAR: I do not detect carotid bruits. PULMONARY: There is good air exchange bilaterally without wheezing or rales. ABDOMEN: Soft and non-tender with normal pitched bowel sounds.  MUSCULOSKELETAL: There are no major deformities or cyanosis. NEUROLOGIC: No focal weakness or paresthesias are detected. SKIN: There are no ulcers or rashes noted. PSYCHIATRIC: The patient has a normal affect.  DATA:    CAROTID DUPLEX: I have independently interpreted her carotid duplex scan today.  On the right side there is a less than 39% stenosis.  The right vertebral artery is patent antegrade flow.  On the left side there is a less than 39% stenosis.  The left vertebral artery is patent with antegrade flow.  Deitra Mayo [facility] [phone]

## 2020-01-28 ENCOUNTER — Encounter: Payer: Self-pay | Admitting: Family Medicine

## 2020-01-28 ENCOUNTER — Ambulatory Visit (INDEPENDENT_AMBULATORY_CARE_PROVIDER_SITE_OTHER): Payer: Medicare Other | Admitting: Family Medicine

## 2020-01-28 VITALS — BP 162/88 | HR 88 | Temp 98.7°F | Resp 18 | Ht 69.0 in | Wt 140.2 lb

## 2020-01-28 DIAGNOSIS — G43109 Migraine with aura, not intractable, without status migrainosus: Secondary | ICD-10-CM

## 2020-01-28 DIAGNOSIS — E785 Hyperlipidemia, unspecified: Secondary | ICD-10-CM

## 2020-01-28 DIAGNOSIS — M5030 Other cervical disc degeneration, unspecified cervical region: Secondary | ICD-10-CM | POA: Diagnosis not present

## 2020-01-28 DIAGNOSIS — I10 Essential (primary) hypertension: Secondary | ICD-10-CM | POA: Diagnosis not present

## 2020-01-28 DIAGNOSIS — I779 Disorder of arteries and arterioles, unspecified: Secondary | ICD-10-CM | POA: Diagnosis not present

## 2020-01-28 DIAGNOSIS — Z8669 Personal history of other diseases of the nervous system and sense organs: Secondary | ICD-10-CM | POA: Diagnosis not present

## 2020-01-28 NOTE — Assessment & Plan Note (Signed)
Stable, continue crestor 

## 2020-01-28 NOTE — Assessment & Plan Note (Signed)
BP elevated. Taking Maxzide and metoprolol. Pt reports normal BP at home 130/70. Recommend cont medication and home monitoring. She is going to discuss stopping metoprolol with dermatology to see if it will change her rash -- if stopping -- she will monitor BP and worsening migraines

## 2020-01-28 NOTE — Progress Notes (Signed)
Subjective:     Christine Daniels is a 74 y.o. female presenting for Transfer of Care (from Dr Deborra Medina)     HPI  # Migraines - started on zonisamide at the [facility] - on 3 capsules nightly before bed for prevention - ends up taking medication 2-3 times a month for migraines - Takes zomig for migraine treatment - wondering about decreasing the zonisamide from 300 >200 mg nightly  #HTN - had the carotid US yesterday - the vascular doctor was not worried - checks at home - 130/70 - sees Dr. Stanford Breed w/ Cardiology - discussed possibly stopping metoprolol to see if it will improve the skin lesions (seeing Dermatology) - planning to discuss with dermatology to get her opinion   #neck pain - sees Dr Nelva Bush because neck pain occasionally triggers migraines - prednisone occasionally   Review of Systems   Social History   Tobacco Use  Smoking Status Former Smoker  . Packs/day: 0.25  . Years: 3.00  . Pack years: 0.75  . Types: Cigarettes  . Quit date: 09/10/1977  . Years since quitting: 42.4  Smokeless Tobacco Never Used        Objective:    BP Readings from Last 3 Encounters:  01/28/20 (!) 162/88  01/27/20 (!) 160/89  05/27/19 128/80   Wt Readings from Last 3 Encounters:  01/28/20 140 lb 4 oz (63.6 kg)  01/27/20 147 lb (66.7 kg)  05/27/19 145 lb (65.8 kg)    BP (!) 162/88   Pulse 88   Temp 98.7 F (37.1 C)   Resp 18   Ht 5\' 9"  (1.753 m)   Wt 140 lb 4 oz (63.6 kg)   SpO2 97%   BMI 20.71 kg/m    Physical Exam Constitutional:      General: She is not in acute distress.    Appearance: She is well-developed. She is not diaphoretic.  HENT:     Right Ear: External ear normal.     Left Ear: External ear normal.  Eyes:     Conjunctiva/sclera: Conjunctivae normal.  Neck:     Vascular: Carotid bruit present.  Cardiovascular:     Rate and Rhythm: Normal rate and regular rhythm.     Heart sounds: No murmur.  Pulmonary:     Effort: Pulmonary effort  is normal. No respiratory distress.     Breath sounds: Normal breath sounds. No wheezing.  Musculoskeletal:     Cervical back: Neck supple.  Skin:    General: Skin is warm and dry.     Capillary Refill: Capillary refill takes less than 2 seconds.  Neurological:     Mental Status: She is alert. Mental status is at baseline.  Psychiatric:        Mood and Affect: Mood normal.        Behavior: Behavior normal.           Assessment & Plan:   Problem List Items Addressed This Visit      Cardiovascular and Mediastinum   Essential hypertension    BP elevated. Taking Maxzide and metoprolol. Pt reports normal BP at home 130/70. Recommend cont medication and home monitoring. She is going to discuss stopping metoprolol with dermatology to see if it will change her rash -- if stopping -- she will monitor BP and worsening migraines      Relevant Medications   metoprolol succinate (TOPROL-XL) 25 MG 24 hr tablet   Carotid artery disease, unspecified laterality ([place])    Reviewed  visit with vascular. Stable disease 39% stenosis. On ASA and statin. Return there in 5 years for f/u imaging.       Relevant Medications   metoprolol succinate (TOPROL-XL) 25 MG 24 hr tablet   Migraine    She does not think higher doses of zonisamide have impacted her migraine frequency - still 2-3/month. Will do trial of 200 mg (lower) dose to see if there is a difference. She will return to 300 mg nightly if migraine frequency increases      Relevant Medications   metoprolol succinate (TOPROL-XL) 25 MG 24 hr tablet     Other   HLD (hyperlipidemia)    Stable, continue crestor      Relevant Medications   metoprolol succinate (TOPROL-XL) 25 MG 24 hr tablet   History of migraine headaches - Primary       Return in about 6 months (around 07/30/2020) for annual.  Lesleigh Noe, MD

## 2020-01-28 NOTE — Assessment & Plan Note (Signed)
She does not think higher doses of zonisamide have impacted her migraine frequency - still 2-3/month. Will do trial of 200 mg (lower) dose to see if there is a difference. She will return to 300 mg nightly if migraine frequency increases

## 2020-01-28 NOTE — Assessment & Plan Note (Signed)
Reviewed visit with vascular. Stable disease 39% stenosis. On ASA and statin. Return there in 5 years for f/u imaging.

## 2020-01-28 NOTE — Patient Instructions (Addendum)
Great to meet you!  #Migraines - Try to decrease Zonisamide to 200 mg at night - If you notice more frequent migraines I would recommend going back to the 300 mg at night - Otherwise, give it a 1 month trial and if still only 2-3 migraines it is reasonable to continue that dose - send me a MyChart message to let me know how the trial goes  #Hypertension - continue to check your blood pressure at home - Make a follow-up visit sooner if you notice that your blood pressure is higher than 140/90 - If you end up stopping the metoprolol and your Blood pressure increases reach out to me or cardiology as we may want to try a different medication  Return for annual exam

## 2020-02-02 DIAGNOSIS — L570 Actinic keratosis: Secondary | ICD-10-CM | POA: Diagnosis not present

## 2020-02-02 DIAGNOSIS — D0461 Carcinoma in situ of skin of right upper limb, including shoulder: Secondary | ICD-10-CM | POA: Diagnosis not present

## 2020-02-02 DIAGNOSIS — X32XXXD Exposure to sunlight, subsequent encounter: Secondary | ICD-10-CM | POA: Diagnosis not present

## 2020-02-18 ENCOUNTER — Other Ambulatory Visit: Payer: Self-pay | Admitting: Family Medicine

## 2020-02-18 ENCOUNTER — Encounter: Payer: Self-pay | Admitting: Family Medicine

## 2020-02-18 DIAGNOSIS — G43109 Migraine with aura, not intractable, without status migrainosus: Secondary | ICD-10-CM

## 2020-02-18 MED ORDER — ZONISAMIDE 100 MG PO CAPS: 300.0000 mg | ORAL_CAPSULE | Freq: Every day | ORAL | 3 refills | Status: DC

## 2020-04-13 DIAGNOSIS — M545 Low back pain: Secondary | ICD-10-CM | POA: Diagnosis not present

## 2020-04-13 DIAGNOSIS — M4696 Unspecified inflammatory spondylopathy, lumbar region: Secondary | ICD-10-CM | POA: Diagnosis not present

## 2020-04-14 DIAGNOSIS — Z1231 Encounter for screening mammogram for malignant neoplasm of breast: Secondary | ICD-10-CM | POA: Diagnosis not present

## 2020-04-14 LAB — HM MAMMOGRAPHY

## 2020-04-15 DIAGNOSIS — M47816 Spondylosis without myelopathy or radiculopathy, lumbar region: Secondary | ICD-10-CM | POA: Insufficient documentation

## 2020-04-20 DIAGNOSIS — L820 Inflamed seborrheic keratosis: Secondary | ICD-10-CM | POA: Diagnosis not present

## 2020-04-20 DIAGNOSIS — Z08 Encounter for follow-up examination after completed treatment for malignant neoplasm: Secondary | ICD-10-CM | POA: Diagnosis not present

## 2020-04-20 DIAGNOSIS — Z85828 Personal history of other malignant neoplasm of skin: Secondary | ICD-10-CM | POA: Diagnosis not present

## 2020-04-26 DIAGNOSIS — M47896 Other spondylosis, lumbar region: Secondary | ICD-10-CM | POA: Diagnosis not present

## 2020-04-27 ENCOUNTER — Other Ambulatory Visit: Payer: Self-pay | Admitting: Cardiology

## 2020-04-27 NOTE — Telephone Encounter (Signed)
Rx has been sent to the pharmacy electronically. ° °

## 2020-05-18 ENCOUNTER — Encounter: Payer: Self-pay | Admitting: Family Medicine

## 2020-05-25 DIAGNOSIS — M47816 Spondylosis without myelopathy or radiculopathy, lumbar region: Secondary | ICD-10-CM | POA: Diagnosis not present

## 2020-05-25 DIAGNOSIS — M5030 Other cervical disc degeneration, unspecified cervical region: Secondary | ICD-10-CM | POA: Diagnosis not present

## 2020-05-25 DIAGNOSIS — M5136 Other intervertebral disc degeneration, lumbar region: Secondary | ICD-10-CM | POA: Diagnosis not present

## 2020-06-05 ENCOUNTER — Telehealth: Payer: Medicare Other | Admitting: Orthopedic Surgery

## 2020-06-05 DIAGNOSIS — J029 Acute pharyngitis, unspecified: Secondary | ICD-10-CM

## 2020-06-05 NOTE — Progress Notes (Signed)
We are sorry that you are not feeling well.  Here is how we plan to help!  Your symptoms indicate a likely viral infection (Pharyngitis).   Pharyngitis is inflammation in the back of the throat which can cause a sore throat, scratchiness and sometimes difficulty swallowing.   Pharyngitis is typically caused by a respiratory virus and will just run its course.  Please keep in mind that your symptoms could last up to 10 days.  For throat pain, we recommend over the counter oral pain relief medications such as acetaminophen or aspirin, or anti-inflammatory medications such as ibuprofen or naproxen sodium.  Topical treatments such as oral throat lozenges or sprays may be used as needed. I find lozenges with benzocaine (like Cepacol) to be most effective. Avoid close contact with loved ones, especially the very young and elderly.  Remember to wash your hands thoroughly throughout the day as this is the number one way to prevent the spread of infection and wipe down door knobs and counters with disinfectant.  After careful review of your answers, I would not recommend and antibiotic for your condition.  Antibiotics should not be used to treat conditions that we suspect are caused by viruses like the virus that causes the common cold or flu. However, some people can have Strep with atypical symptoms. You may need formal testing in clinic or office to confirm if your symptoms continue or worsen.  Providers prescribe antibiotics to treat infections caused by bacteria. Antibiotics are very powerful in treating bacterial infections when they are used properly.  To maintain their effectiveness, they should be used only when necessary.  Overuse of antibiotics has resulted in the development of super bugs that are resistant to treatment!    Home Care:  Only take medications as instructed by your medical team.  Do not drink alcohol while taking these medications.  A steam or ultrasonic humidifier can help congestion.   You can place a towel over your head and breathe in the steam from hot water coming from a faucet.  Avoid close contacts especially the very young and the elderly.  Cover your mouth when you cough or sneeze.  Always remember to wash your hands.  Get Help Right Away If:  You develop worsening fever or throat pain.  You develop a severe head ache or visual changes.  Your symptoms persist after you have completed your treatment plan.  Make sure you  Understand these instructions.  Will watch your condition.  Will get help right away if you are not doing well or get worse.  Your e-visit answers were reviewed by a board certified advanced clinical practitioner to complete your personal care plan.  Depending on the condition, your plan could have included both over the counter or prescription medications.  If there is a problem please reply  once you have received a response from your provider.  Your safety is important to [place].  If you have drug allergies check your prescription carefully.    You can use MyChart to ask questions about todays visit, request a non-urgent call back, or ask for a work or school excuse for 24 hours related to this e-Visit. If it has been greater than 24 hours you will need to follow up with your provider, or enter a new e-Visit to address those concerns.  You will get an e-mail in the next two days asking about your experience.  I hope that your e-visit has been valuable and will speed your recovery. Thank  you for using e-visits.   Greater than 5 minutes, yet less than 10 minutes of time have been spent researching, coordinating and implementing care for this patient today.

## 2020-06-15 NOTE — Progress Notes (Signed)
HPI: FU hypertension; also with cerebrovascular disease followed by vascular surgery.  Carotid Dopplers May 2021 showed 1 to 39% bilateral stenosis.  Since last seen, she is doing well with no chest pain, dyspnea or syncope.  Current Outpatient Medications  Medication Sig Dispense Refill   aspirin 81 MG tablet Take 81 mg by mouth daily.       Coenzyme Q10 (CO Q10) 100 MG TABS Take 1 tablet by mouth every other day.      loratadine-pseudoephedrine (CLARITIN-D 12-HOUR) 5-120 MG per tablet Take 1 tablet by mouth daily as needed.      metoprolol succinate (TOPROL-XL) 25 MG 24 hr tablet TAKE 1 TABLET BY MOUTH EVERY DAY 90 tablet 0   potassium chloride SA (KLOR-CON M20) 20 MEQ tablet Take 1 tablet (20 mEq total) by mouth every other day. 45 tablet 9   rosuvastatin (CRESTOR) 5 MG tablet Take 1 tablet (5 mg total) by mouth every other day. 90 tablet 3   triamterene-hydrochlorothiazide (MAXZIDE) 75-50 MG per tablet Take 1 tablet by mouth daily. 100 tablet 3   zolmitriptan (ZOMIG-ZMT) 2.5 MG disintegrating tablet TAKE 1 TABLET (2.5 MG TOTAL) BY MOUTH AS NEEDED FOR MIGRAINE. 10 tablet 3   zonisamide (ZONEGRAN) 100 MG capsule Take 3 capsules (300 mg total) by mouth daily. Take 3qhs (plz sched appt with new provider) 270 capsule 3   Cholecalciferol (VITAMIN D3 PO) Vitamin D3     No current facility-administered medications for this visit.     Past Medical History:  Diagnosis Date   Arthritis    Carotid artery occlusion    CEREBROVASCULAR DISEASE    HYPERLIPIDEMIA    Hypertension    Migraine     Past Surgical History:  Procedure Laterality Date   ABDOMINAL HYSTERECTOMY  2010   BREAST BIOPSY  1987   Status post bilateral tubal ligation.      Social History   Socioeconomic History   Marital status: Married    Spouse name: [name]    Number of children: 2   Years of education: College   Highest education level: Not on file  Occupational History   Not on file    Tobacco Use   Smoking status: Former Smoker    Packs/day: 0.25    Years: 3.00    Pack years: 0.75    Types: Cigarettes    Quit date: 09/10/1977    Years since quitting: 42.8   Smokeless tobacco: Never Used  Vaping Use   Vaping Use: Never used  Substance and Sexual Activity   Alcohol use: Yes    Alcohol/week: 1.0 standard drink    Types: 1 Standard drinks or equivalent per week    Comment: occasionally a beer, 1-2 at a time   Drug use: No   Sexual activity: Yes    Birth control/protection: Post-menopausal, Surgical  Other Topics Concern   Not on file  Social History Narrative   She has remote history of minimal tobacco use, but has     not smoked in years.  She rarely consumes alcohol.    Desires CPR      01/28/20   From: the area   Living: with husband, [name] [phone])    Work: Therapist, art rep for an [employer]      Family: has 2 adult children - Brooke and Rob - 2 grandchildren      Enjoys: play golf, spend time with friends      Exercise: walking - not  as much as she should   Diet: egg and toast, works through lunch but eats, healthy food at dinner      Safety   Seat belts: Yes    Guns: No   Safe in relationships: Yes    Social Determinants of Radio broadcast assistant Strain:    Difficulty of Paying Living Expenses: Not on file  Food Insecurity:    Worried About Charity fundraiser in the Last Year: Not on file   [employer] of Food in the Last Year: Not on file  Transportation Needs:    Lack of Transportation (Medical): Not on file   Lack of Transportation (Non-Medical): Not on file  Physical Activity:    Days of Exercise per Week: Not on file   Minutes of Exercise per Session: Not on file  Stress:    Feeling of Stress : Not on file  Social Connections:    Frequency of Communication with Friends and Family: Not on file   Frequency of Social Gatherings with Friends and Family: Not on file   Attends Religious Services: Not on file    Active Member of Clubs or Organizations: Not on file   Attends Archivist Meetings: Not on file   Marital Status: Not on file  Intimate Partner Violence:    Fear of Current or Ex-Partner: Not on file   Emotionally Abused: Not on file   Physically Abused: Not on file   Sexually Abused: Not on file    Family History  Problem Relation Age of Onset   Breast cancer Mother 13   Hyperlipidemia Father    Stroke Father 22    ROS: no fevers or chills, productive cough, hemoptysis, dysphasia, odynophagia, melena, hematochezia, dysuria, hematuria, rash, seizure activity, orthopnea, PND, pedal edema, claudication. Remaining systems are negative.  Physical Exam: Well-developed well-nourished in no acute distress.  Skin is warm and dry.  HEENT is normal.  Neck is supple.  Chest is clear to auscultation with normal expansion.  Cardiovascular exam is regular rate and rhythm.  Abdominal exam nontender or distended. No masses palpated. Extremities show no edema. neuro grossly intact  ECG-sinus rhythm at a rate of 81, no ST changes.  Personally reviewed  A/P  1 carotid artery disease-mild on most recent Dopplers.  Followed by vascular surgery.  Continue medical therapy.  2 hypertension-blood pressure elevated; however she follows this closely at home and it is typically controlled with systolic blood pressure of 1 20-130.  Continue present medications and follow.  Potassium and renal function monitored by primary care.  3 hyperlipidemia-continue statin.  Lipids and liver monitored by primary care.  Kirk Ruths, MD

## 2020-06-21 DIAGNOSIS — M5136 Other intervertebral disc degeneration, lumbar region: Secondary | ICD-10-CM | POA: Diagnosis not present

## 2020-06-24 ENCOUNTER — Ambulatory Visit: Payer: Medicare Other | Admitting: Cardiology

## 2020-06-24 ENCOUNTER — Other Ambulatory Visit: Payer: Self-pay

## 2020-06-24 ENCOUNTER — Encounter: Payer: Self-pay | Admitting: Cardiology

## 2020-06-24 VITALS — BP 154/86 | HR 81 | Ht 69.0 in | Wt 144.2 lb

## 2020-06-24 DIAGNOSIS — I779 Disorder of arteries and arterioles, unspecified: Secondary | ICD-10-CM | POA: Diagnosis not present

## 2020-06-24 DIAGNOSIS — E7800 Pure hypercholesterolemia, unspecified: Secondary | ICD-10-CM | POA: Diagnosis not present

## 2020-06-24 DIAGNOSIS — I10 Essential (primary) hypertension: Secondary | ICD-10-CM

## 2020-06-24 MED ORDER — METOPROLOL SUCCINATE ER 25 MG PO TB24: 25.0000 mg | ORAL_TABLET | Freq: Every day | ORAL | 3 refills | Status: DC

## 2020-06-24 NOTE — Patient Instructions (Signed)

## 2020-07-09 ENCOUNTER — Other Ambulatory Visit: Payer: Self-pay | Admitting: Cardiology

## 2020-07-21 ENCOUNTER — Ambulatory Visit (INDEPENDENT_AMBULATORY_CARE_PROVIDER_SITE_OTHER): Payer: Medicare Other

## 2020-07-21 DIAGNOSIS — Z23 Encounter for immunization: Secondary | ICD-10-CM

## 2020-07-27 ENCOUNTER — Ambulatory Visit: Payer: Medicare Other | Admitting: Family Medicine

## 2020-08-03 ENCOUNTER — Other Ambulatory Visit: Payer: Self-pay | Admitting: Family Medicine

## 2020-08-03 DIAGNOSIS — I10 Essential (primary) hypertension: Secondary | ICD-10-CM

## 2020-08-03 DIAGNOSIS — E785 Hyperlipidemia, unspecified: Secondary | ICD-10-CM

## 2020-08-15 ENCOUNTER — Other Ambulatory Visit (INDEPENDENT_AMBULATORY_CARE_PROVIDER_SITE_OTHER): Payer: Medicare Other

## 2020-08-15 ENCOUNTER — Other Ambulatory Visit: Payer: Self-pay

## 2020-08-15 DIAGNOSIS — I10 Essential (primary) hypertension: Secondary | ICD-10-CM | POA: Diagnosis not present

## 2020-08-15 DIAGNOSIS — E785 Hyperlipidemia, unspecified: Secondary | ICD-10-CM

## 2020-08-15 LAB — LIPID PANEL
Cholesterol: 194 mg/dL (ref 0–200)
HDL: 39.7 mg/dL (ref >39.00)
LDL Cholesterol: 115 mg/dL — ABNORMAL HIGH (ref 0–99)
NonHDL: 153.8
Total CHOL/HDL Ratio: 5
Triglycerides: 196 mg/dL — ABNORMAL HIGH (ref 0.0–149.0)
VLDL: 39.2 mg/dL (ref 0.0–40.0)

## 2020-08-15 LAB — CBC
HCT: 42.4 % (ref 36.0–46.0)
Hemoglobin: 14.1 g/dL (ref 12.0–15.0)
MCHC: 33.2 g/dL (ref 30.0–36.0)
MCV: 92.9 fl (ref 78.0–100.0)
Platelets: 324 10*3/uL (ref 150.0–400.0)
RBC: 4.57 Mil/uL (ref 3.87–5.11)
RDW: 12.8 % (ref 11.5–15.5)
WBC: 8.8 10*3/uL (ref 4.0–10.5)

## 2020-08-15 LAB — COMPREHENSIVE METABOLIC PANEL
ALT: 17 U/L (ref 0–35)
AST: 21 U/L (ref 0–37)
Albumin: 4.4 g/dL (ref 3.5–5.2)
Alkaline Phosphatase: 82 U/L (ref 39–117)
BUN: 23 mg/dL (ref 6–23)
CO2: 32 mEq/L (ref 19–32)
Calcium: 9.8 mg/dL (ref 8.4–10.5)
Chloride: 99 mEq/L (ref 96–112)
Creatinine, Ser: 0.99 mg/dL (ref 0.40–1.20)
GFR: 56.52 mL/min — ABNORMAL LOW (ref >60.00)
Glucose, Bld: 95 mg/dL (ref 70–99)
Potassium: 3.5 mEq/L (ref 3.5–5.1)
Sodium: 141 mEq/L (ref 135–145)
Total Bilirubin: 0.8 mg/dL (ref 0.2–1.2)
Total Protein: 6.6 g/dL (ref 6.0–8.3)

## 2020-08-15 LAB — TSH: TSH: 3.8 u[IU]/mL (ref 0.35–4.50)

## 2020-08-16 ENCOUNTER — Other Ambulatory Visit: Payer: Self-pay | Admitting: *Deleted

## 2020-08-16 ENCOUNTER — Other Ambulatory Visit: Payer: Self-pay | Admitting: Cardiology

## 2020-08-16 MED ORDER — ZOLMITRIPTAN 2.5 MG PO TBDP: 2.5000 mg | ORAL_TABLET | ORAL | 0 refills | Status: DC | PRN

## 2020-08-22 ENCOUNTER — Ambulatory Visit (INDEPENDENT_AMBULATORY_CARE_PROVIDER_SITE_OTHER): Payer: Medicare Other | Admitting: Family Medicine

## 2020-08-22 ENCOUNTER — Other Ambulatory Visit: Payer: Self-pay

## 2020-08-22 VITALS — BP 120/80 | HR 87 | Temp 96.9°F | Ht 67.0 in | Wt 142.5 lb

## 2020-08-22 DIAGNOSIS — Z0000 Encounter for general adult medical examination without abnormal findings: Secondary | ICD-10-CM

## 2020-08-22 DIAGNOSIS — E785 Hyperlipidemia, unspecified: Secondary | ICD-10-CM

## 2020-08-22 DIAGNOSIS — Z1211 Encounter for screening for malignant neoplasm of colon: Secondary | ICD-10-CM | POA: Diagnosis not present

## 2020-08-22 NOTE — Patient Instructions (Addendum)
Get Cologuard  Return in 3 months for fasting labs  Christine Daniels , Thank you for taking time to come for your Medicare Wellness Visit. I appreciate your ongoing commitment to your health goals. Please review the following plan we discussed and let me know if I can assist you in the future.   These are the goals we discussed: Goals     Maintain healthy active lifestyle.       This is a list of the screening recommended for you and due dates:  Health Maintenance  Topic Date Due   Tetanus Vaccine  02/10/2022   Mammogram  04/14/2022   Colon Cancer Screening  02/09/2028   Flu Shot  Completed   DEXA scan (bone density measurement)  Completed   COVID-19 Vaccine  Completed    Hepatitis C: One time screening is recommended by Center for Disease Control  (CDC) for  adults born from [birth year] through 1964.   Completed   Pneumonia vaccines  Completed

## 2020-08-22 NOTE — Progress Notes (Signed)
Subjective:   Alvina Strother Billman is a 74 y.o. female who presents for Medicare Annual (Subsequent) preventive examination.  Review of Systems    Review of Systems  Constitutional: Negative for chills and fever.  HENT: Negative for congestion and sore throat.   Eyes: Negative for blurred vision and double vision.  Respiratory: Negative for shortness of breath.   Cardiovascular: Negative for chest pain.  Gastrointestinal: Negative for heartburn, nausea and vomiting.  Genitourinary: Negative.   Musculoskeletal: Negative.  Negative for myalgias.  Skin: Negative for rash.  Neurological: Negative for dizziness and headaches.  Endo/Heme/Allergies: Does not bruise/bleed easily.  Psychiatric/Behavioral: Negative for depression. The patient is not nervous/anxious.     Cardiac Risk Factors include: advanced age (>43men, >44 women);smoking/ tobacco exposure     Objective:    Today's Vitals   08/22/20 0827 08/22/20 0837  BP: 120/80   Pulse: 87   Temp: (!) 96.9 F (36.1 C)   TempSrc: Temporal   SpO2: 100%   Weight: 142 lb 8 oz (64.6 kg)   Height: 5\' 7"  (1.702 m)   PainSc:  4    Body mass index is 22.32 kg/m.  Advanced Directives 08/22/2020 04/16/2018 08/24/2015 08/25/2014  Does Patient Have a Medical Advance Directive? Yes Yes Yes Yes  Type of Paramedic of [place];Living will [place];Living will Living will;Healthcare Power of Attorney  Does patient want to make changes to medical advance directive? No - Patient declined - - No - Patient declined  Copy of [place] in Chart? No - copy requested No - copy requested - No - copy requested    Current Medications (verified) Outpatient Encounter Medications as of 08/22/2020  Medication Sig  . aspirin 81 MG tablet Take 81 mg by mouth daily.  . Cholecalciferol (VITAMIN D3 PO) Vitamin D3  . Coenzyme Q10 (CO Q10) 100 MG TABS Take 1 tablet by  mouth every other day.  Marland Kitchen KLOR-CON M20 20 MEQ tablet TAKE 1 TABLET (20 MEQ TOTAL) BY MOUTH EVERY OTHER DAY.  Marland Kitchen loratadine-pseudoephedrine (CLARITIN-D 12-HOUR) 5-120 MG per tablet Take 1 tablet by mouth daily as needed.   . metoprolol succinate (TOPROL-XL) 25 MG 24 hr tablet Take 1 tablet (25 mg total) by mouth daily.  . rosuvastatin (CRESTOR) 5 MG tablet TAKE 1 TABLET BY MOUTH EVERY OTHER DAY **NEEDS PA*  . triamterene-hydrochlorothiazide (MAXZIDE) 75-50 MG per tablet Take 1 tablet by mouth daily.  Marland Kitchen zolmitriptan (ZOMIG-ZMT) 2.5 MG disintegrating tablet Take 1 tablet (2.5 mg total) by mouth as needed for migraine.  Marland Kitchen zonisamide (ZONEGRAN) 100 MG capsule Take 3 capsules (300 mg total) by mouth daily. Take 3qhs (plz sched appt with new provider) (Patient taking differently: Take 300 mg by mouth daily. Take 3qhs)   No facility-administered encounter medications on file as of 08/22/2020.    Allergies (verified) Patient has no known allergies.   History: Past Medical History:  Diagnosis Date  . Arthritis   . Carotid artery occlusion   . CEREBROVASCULAR DISEASE   . HYPERLIPIDEMIA   . Hypertension   . Migraine    Past Surgical History:  Procedure Laterality Date  . ABDOMINAL HYSTERECTOMY  2010  . BREAST BIOPSY  1987  . Status post bilateral tubal ligation.     Family History  Problem Relation Age of Onset  . Breast cancer Mother 31  . Hyperlipidemia Father   . Stroke Father 65   Social History  Socioeconomic History  . Marital status: Married    Spouse name: [name]   . Number of children: 2  . Years of education: College  . Highest education level: Not on file  Occupational History  . Not on file  Tobacco Use  . Smoking status: Former Smoker    Packs/day: 0.25    Years: 3.00    Pack years: 0.75    Types: Cigarettes    Quit date: 09/10/1977    Years since quitting: 42.9  . Smokeless tobacco: Never Used  Vaping Use  . Vaping Use: Never used  Substance and Sexual Activity   . Alcohol use: Yes    Alcohol/week: 1.0 standard drink    Types: 1 Standard drinks or equivalent per week    Comment: occasionally a beer, 1-2 at a time  . Drug use: No  . Sexual activity: Yes    Birth control/protection: Post-menopausal, Surgical  Other Topics Concern  . Not on file  Social History Narrative   She has remote history of minimal tobacco use, but has     not smoked in years.  She rarely consumes alcohol.    Desires CPR      01/28/20   From: the area   Living: with husband, [name] [phone])    Work: Therapist, art rep for an [employer]      Family: has 2 adult children - Production assistant, radio and Rob - 2 grandchildren      Enjoys: play golf, spend time with friends      Exercise: walking - not as much as she should   Diet: egg and toast, works through lunch but eats, healthy food at dinner      Safety   Seat belts: Yes    Guns: No   Safe in relationships: Yes    Social Determinants of Radio broadcast assistant Strain: Not on file  Food Insecurity: Not on file  Transportation Needs: Not on file  Physical Activity: Not on file  Stress: Not on file  Social Connections: Not on file    Tobacco Counseling Counseling given: Not Answered   Clinical Intake:  Pre-visit preparation completed: Yes  Pain : 0-10 Pain Score: 4  Pain Type: Chronic pain Pain Location: Back Pain Orientation: Lower,Mid Pain Onset: More than a month ago Pain Frequency: Constant Pain Relieving Factors: pain medication, heat Effect of Pain on Daily Activities: no  Pain Relieving Factors: pain medication, heat  Nutritional Status: BMI of 19-24  Normal Nutritional Risks: None Diabetes: No  How often do you need to have someone help you when you read instructions, pamphlets, or other written materials from your doctor or pharmacy?: 1 - Never What is the last grade level you completed in school?: college  Diabetic? no  Interpreter Needed?: No      Activities of Daily Living In  your present state of health, do you have any difficulty performing the following activities: 08/22/2020  Hearing? N  Vision? N  Difficulty concentrating or making decisions? N  Walking or climbing stairs? N  Dressing or bathing? N  Doing errands, shopping? N  Preparing Food and eating ? N  Using the Toilet? N  In the past six months, have you accidently leaked urine? N  Do you have problems with loss of bowel control? N  Managing your Medications? N  Managing your Finances? N  Housekeeping or managing your Housekeeping? N  Some recent data might be hidden    Patient Care Team: Waunita Schooner  R, MD as PCP - General (Family Medicine) Suella Broad, MD (Physical Medicine and Rehabilitation) Stanford Breed Denice Bors, MD as Consulting Physician (Cardiology) Angelia Mould, MD as Consulting Physician (Vascular Surgery) Vania Rea, MD as Consulting Physician (Obstetrics and Gynecology) Levy Sjogren, MD as Referring Physician (Dermatology)  Indicate any recent Medical Services you may have received from other than [facility] providers in the past year (date may be approximate).     Assessment:   This is a routine wellness examination for Altie.  Hearing/Vision screen  Hearing Screening   125Hz  250Hz  500Hz  1000Hz  2000Hz  3000Hz  4000Hz  6000Hz  8000Hz   Right ear:  20 20 25 25   0    Left ear:  25 25 25 25   0    Vision Screening Comments: Last eye exam 07/21 @ [facility]   Dietary issues and exercise activities discussed: Current Exercise Habits: Home exercise routine, Type of exercise: walking, Time (Minutes): 30, Frequency (Times/Week): 4, Weekly Exercise (Minutes/Week): 120, Intensity: Mild, Exercise limited by: orthopedic condition(s)  Goals    . Maintain healthy active lifestyle.      Depression Screen PHQ 2/9 Scores 08/22/2020 05/27/2019 04/16/2018 07/08/2017 05/31/2016  PHQ - 2 Score 0 0 0 0 0    Fall Risk Fall Risk  08/22/2020 08/22/2020 10/05/2019  04/16/2018 07/08/2017  Falls in the past year? 0 0 0 No No  Number falls in past yr: 0 0 - - -  Injury with Fall? 0 - - - -  Follow up Falls evaluation completed - Falls evaluation completed - -    Cognitive Function:       Mini-Cog - 08/22/20 0847    Normal clock drawing test? yes    How many words correct? 3              Immunizations Immunization History  Administered Date(s) Administered  . Fluad Quad(high Dose 65+) 05/27/2019  . Influenza, High Dose Seasonal PF 07/22/2018  . Influenza,inj,Quad PF,6+ Mos 06/05/2013, 06/21/2014, 05/31/2016, 06/05/2017, 07/21/2020  . Influenza,inj,quad, With Preservative 06/10/2017  . PFIZER SARS-COV-2 Vaccination 10/24/2019, 11/18/2019, 08/07/2020  . Pneumococcal Conjugate-13 07/27/2014  . Pneumococcal Polysaccharide-23 06/05/2013  . Tdap 02/11/2012    TDAP status: Up to date  Flu Vaccine status: Up to date  Pneumococcal vaccine status: Up to date  Covid-19 vaccine status: Completed vaccines  Qualifies for Shingles Vaccine? Yes   Zostavax completed Yes   Shingrix Completed?: No.    Education has been provided regarding the importance of this vaccine. Patient has been advised to call insurance company to determine out of pocket expense if they have not yet received this vaccine. Advised may also receive vaccine at local pharmacy or Health Dept. Verbalized acceptance and understanding.  Screening Tests Health Maintenance  Topic Date Due  . TETANUS/TDAP  02/10/2022  . MAMMOGRAM  04/14/2022  . COLONOSCOPY  02/09/2028  . INFLUENZA VACCINE  Completed  . DEXA SCAN  Completed  . COVID-19 Vaccine  Completed  . Hepatitis C Screening  Completed  . PNA vac Low Risk Adult  Completed    Health Maintenance  There are no preventive care reminders to display for this patient.  Colorectal cancer screening: Type of screening: Cologuard. Completed 2017. Repeat every 3 years  Mammogram status: Completed 2021. Repeat every year  Bone  Density status: Completed 2018. Results reflect: Bone density results: OSTEOPENIA. Repeat every 5 years.   Additional Screening:  Hepatitis C Screening: does qualify; Completed 2017  Vision Screening: Recommended annual ophthalmology exams for early  detection of glaucoma and other disorders of the eye. Is the patient up to date with their annual eye exam?  Yes  Who is the provider or what is the name of the office in which the patient attends annual eye exams? [facility] If pt is not established with a provider, would they like to be referred to a provider to establish care? n/a.   Dental Screening: Recommended annual dental exams for proper oral hygiene  Community Resource Referral / Chronic Care Management: CRR required this visit?  No   CCM required this visit?  No      Plan:     I have personally reviewed and noted the following in the patient's chart:   . Medical and social history . Use of alcohol, tobacco or illicit drugs  . Current medications and supplements . Functional ability and status . Nutritional status . Physical activity . Advanced directives . List of other physicians . Hospitalizations, surgeries, and ER visits in previous 12 months . Vitals . Screenings to include cognitive, depression, and falls . Referrals and appointments  In addition, I have reviewed and discussed with patient certain preventive protocols, quality metrics, and best practice recommendations. A written personalized care plan for preventive services as well as general preventive health recommendations were provided to patient.     Lesleigh Noe, MD   08/22/2020

## 2020-10-04 ENCOUNTER — Other Ambulatory Visit: Payer: Self-pay | Admitting: Cardiology

## 2020-10-18 DIAGNOSIS — L570 Actinic keratosis: Secondary | ICD-10-CM | POA: Diagnosis not present

## 2020-10-18 DIAGNOSIS — X32XXXD Exposure to sunlight, subsequent encounter: Secondary | ICD-10-CM | POA: Diagnosis not present

## 2020-10-25 ENCOUNTER — Encounter: Payer: Self-pay | Admitting: Family Medicine

## 2020-10-25 ENCOUNTER — Telehealth: Payer: Self-pay

## 2020-10-25 DIAGNOSIS — Z1211 Encounter for screening for malignant neoplasm of colon: Secondary | ICD-10-CM

## 2020-10-25 NOTE — Telephone Encounter (Signed)
Received notice from [facility] stating that pt has not returned her cologuard sample.  Called pt and left a VM (DPR) requesting a call back or mychart message letting us know if pt is still planning on sending a sample in or if she needs a new kit.

## 2020-10-31 ENCOUNTER — Telehealth: Payer: Self-pay

## 2020-10-31 ENCOUNTER — Encounter: Payer: Self-pay | Admitting: Family Medicine

## 2020-10-31 NOTE — Telephone Encounter (Signed)
Called and left detailed voicemail (ok per DPR) stating that Dr. Einar Pheasant had a 2:00 appointment tomorrow if she was interested in this appointment. Per MyChart message, patient was upset that an appointment was not offered and that she was just recommended to go to [facility]. Awaiting call back from patient.

## 2020-10-31 NOTE — Telephone Encounter (Signed)
Agree with appointment

## 2020-10-31 NOTE — Telephone Encounter (Signed)
[facility] - Client TELEPHONE ADVICE RECORD [facility] Patient Name: Christine Daniels Gender: Female DOB: [birth year] Age: 75 Y 10 M 80 D Return Phone Number: [phone] (Primary) Address: City/State/Zip: [place] [address] Client [facility] - Client Client Site [facility] - Day Physician Waunita Schooner- MD Contact Type Call Who Is Calling Patient / Member / Family / Caregiver Call Type Triage / Clinical Relationship To Patient Self Return Phone Number [phone] (Primary) Chief Complaint Urination Pain Reason for Call Symptomatic / Request for [name] is having burning while urinating and back pain. Translation No Nurse Assessment Nurse: Mancel Bale, RN, Butch Penny Date/Time Eilene Ghazi Time): 10/31/2020 8:36:23 AM Confirm and document reason for call. If symptomatic, describe symptoms. ---Caller states she started having burning with urination and also back pain. Symptoms started on Friday night. Denies temp. Does the patient have any new or worsening symptoms? ---Yes Will a triage be completed? ---Yes Related visit to physician within the last 2 weeks? ---No Does the PT have any chronic conditions? (i.e. diabetes, asthma, this includes High risk factors for pregnancy, etc.) ---Yes List chronic conditions. ---Migraine Is this a behavioral health or substance abuse call? ---No Guidelines Guideline Title Affirmed Question Affirmed Notes Nurse Date/Time Eilene Ghazi Time) Urination Pain - Female Side (flank) or lower back pain present Sheran Fava 10/31/2020 8:37:51 AM Disp. Time Eilene Ghazi Time) Disposition Final User 10/31/2020 8:45:17 AM See HCP within 4 Hours (or PCP triage) Yes Mancel Bale, RN, [place] [place] Disagree/Comply Comply Caller Understands Yes PreDisposition Did not know what to do PLEASE NOTE: All timestamps contained within this report are represented as [place]  Standard Time. CONFIDENTIALTY NOTICE: This fax transmission is intended only for the addressee. It contains information that is legally privileged, confidential or otherwise protected from use or disclosure. If you are not the intended recipient, you are strictly prohibited from reviewing, disclosing, copying using or disseminating any of this information or taking any action in reliance on or regarding this information. If you have received this fax in error, please notify us immediately by telephone so that we can arrange for its return to [place]. Phone: [phone], Toll-Free: [phone], Fax: [phone] Page: 2 of 2 Call Id: [id number] Care Advice Given Per Guideline * IF OFFICE WILL BE OPEN: You need to be seen within the next 3 or 4 hours. Call your doctor (or NP/PA) now or as soon as the office opens. * For pain relief, you can take either acetaminophen, ibuprofen, or naproxen. CALL BACK IF: * You become worse Comments User: Marijo Conception, RN Date/Time Eilene Ghazi Time): 10/31/2020 8:45:16 AM Spoke with [name] on the [facility], [phone], and she stated they do not have any available appts today so to please refer her to UC. Referrals GO TO FACILITY UNDECIDED

## 2020-11-02 NOTE — Telephone Encounter (Signed)
Left message and asked patient to call me back to discuss her negative care experience on 10/31/20 with our on call triage service.

## 2020-11-03 NOTE — Telephone Encounter (Signed)
Left message on patients VM to please call back and ask to speak with me so that I can assist her with making an appointment.  As she is still having symptoms, I would like to get her worked in Architectural technologist with one of our providers.

## 2020-11-04 ENCOUNTER — Ambulatory Visit (INDEPENDENT_AMBULATORY_CARE_PROVIDER_SITE_OTHER): Payer: Medicare Other | Admitting: Family Medicine

## 2020-11-04 ENCOUNTER — Encounter: Payer: Self-pay | Admitting: Family Medicine

## 2020-11-04 ENCOUNTER — Other Ambulatory Visit: Payer: Self-pay

## 2020-11-04 VITALS — BP 150/80 | HR 85 | Temp 98.2°F | Ht 67.0 in | Wt 144.5 lb

## 2020-11-04 DIAGNOSIS — R350 Frequency of micturition: Secondary | ICD-10-CM

## 2020-11-04 LAB — POC URINALSYSI DIPSTICK (AUTOMATED)
Bilirubin, UA: NEGATIVE
Blood, UA: NEGATIVE
Glucose, UA: NEGATIVE
Ketones, UA: NEGATIVE
Nitrite, UA: NEGATIVE
Protein, UA: NEGATIVE
Spec Grav, UA: 1.015 (ref 1.010–1.025)
Urobilinogen, UA: 0.2 E.U./dL
pH, UA: 6 (ref 5.0–8.0)

## 2020-11-04 MED ORDER — CEPHALEXIN 500 MG PO CAPS: 500.0000 mg | ORAL_CAPSULE | Freq: Three times a day (TID) | ORAL | 0 refills | Status: DC

## 2020-11-04 NOTE — Addendum Note (Signed)
Addended by: Carter Kitten on: 11/04/2020 04:10 PM   Modules accepted: Orders

## 2020-11-04 NOTE — Assessment & Plan Note (Signed)
Keep up with fluids. Complete antibiotics.  We will call with urine culture results.

## 2020-11-04 NOTE — Patient Instructions (Signed)
Keep up with fluids. Complete antibiotics.  We will call with urine culture results to verify if there is a true UTI.

## 2020-11-04 NOTE — Progress Notes (Signed)
Patient ID: Christine Daniels, female    DOB: September 06, 1946, 75 y.o.   MRN: [id number]  This visit was conducted in person.  BP (!) 150/80   Pulse 85   Temp 98.2 F (36.8 C) (Temporal)   Ht 5\' 7"  (1.702 m)   Wt 144 lb 8 oz (65.5 kg)   SpO2 99%   BMI 22.63 kg/m    CC:  Chief Complaint  Patient presents with  . Back Pain  . Urinary Urgency    Subjective:   HPI: Christine Daniels is a 75 y.o. female presenting on 11/04/2020 for Back Pain and Urinary Urgency  She has noted 6 days of dysuria, increase in frequecny and urgency. In last few days noted low back pain, low abd pressure.  no fever, no chills. No nausea. No blood in urine.   Drinking lots of water and cranberry.    Last UTI 2018 [place], pansensitive.  no past kidney issues.  No recent kidney/urinary procedures. No history of kidney stones.  Elevated BP... on HCTZ and metoprolol .. has been good at home lately. BP Readings from Last 3 Encounters:  11/04/20 (!) 150/80  08/22/20 120/80  06/24/20 (!) 154/86     Relevant past medical, surgical, family and social history reviewed and updated as indicated. Interim medical history since our last visit reviewed. Allergies and medications reviewed and updated. Outpatient Medications Prior to Visit  Medication Sig Dispense Refill  . aspirin 81 MG tablet Take 81 mg by mouth daily.    . Cholecalciferol (VITAMIN D3 PO) Vitamin D3    . Coenzyme Q10 (CO Q10) 100 MG TABS Take 1 tablet by mouth every other day.    Marland Kitchen KLOR-CON M20 20 MEQ tablet TAKE 1 TABLET (20 MEQ TOTAL) BY MOUTH EVERY OTHER DAY. 45 tablet 10  . loratadine-pseudoephedrine (CLARITIN-D 12-HOUR) 5-120 MG per tablet Take 1 tablet by mouth daily as needed.     . metoprolol succinate (TOPROL-XL) 25 MG 24 hr tablet TAKE 1 TABLET BY MOUTH EVERY DAY 90 tablet 3  . rosuvastatin (CRESTOR) 5 MG tablet TAKE 1 TABLET BY MOUTH EVERY OTHER DAY **NEEDS PA* 45 tablet 7  . triamterene-hydrochlorothiazide (MAXZIDE) 75-50 MG per  tablet Take 1 tablet by mouth daily. 100 tablet 3  . zolmitriptan (ZOMIG-ZMT) 2.5 MG disintegrating tablet Take 1 tablet (2.5 mg total) by mouth as needed for migraine. 10 tablet 0  . zonisamide (ZONEGRAN) 100 MG capsule Take 3 capsules (300 mg total) by mouth daily. Take 3qhs (plz sched appt with new provider) (Patient taking differently: Take 300 mg by mouth daily. Take 3qhs) 270 capsule 3   No facility-administered medications prior to visit.     Per HPI unless specifically indicated in ROS section below Review of Systems  Constitutional: Negative for fatigue and fever.  HENT: Negative for congestion.   Eyes: Negative for pain.  Respiratory: Negative for cough and shortness of breath.   Cardiovascular: Negative for chest pain, palpitations and leg swelling.  Gastrointestinal: Negative for abdominal pain.  Genitourinary: Positive for dysuria. Negative for vaginal bleeding.  Musculoskeletal: Negative for back pain.  Neurological: Negative for syncope, light-headedness and headaches.  Psychiatric/Behavioral: Negative for dysphoric mood.   Objective:  BP (!) 150/80   Pulse 85   Temp 98.2 F (36.8 C) (Temporal)   Ht 5\' 7"  (1.702 m)   Wt 144 lb 8 oz (65.5 kg)   SpO2 99%   BMI 22.63 kg/m   Wt Readings from Last 3 Encounters:  11/04/20 144 lb 8 oz (65.5 kg)  08/22/20 142 lb 8 oz (64.6 kg)  06/24/20 144 lb 3.2 oz (65.4 kg)      Physical Exam Constitutional:      General: She is not in acute distress.Vital signs are normal.     Appearance: Normal appearance. She is well-developed and well-nourished. She is not ill-appearing or toxic-appearing.  HENT:     Head: Normocephalic.     Right Ear: Hearing, tympanic membrane, ear canal and external ear normal. Tympanic membrane is not erythematous, retracted or bulging.     Left Ear: Hearing, tympanic membrane, ear canal and external ear normal. Tympanic membrane is not erythematous, retracted or bulging.     Nose: No mucosal edema or  rhinorrhea.     Right Sinus: No maxillary sinus tenderness or frontal sinus tenderness.     Left Sinus: No maxillary sinus tenderness or frontal sinus tenderness.     Mouth/Throat:     Mouth: Oropharynx is clear and moist and mucous membranes are normal.     Pharynx: Uvula midline.  Eyes:     General: Lids are normal. Lids are everted, no foreign bodies appreciated.     Extraocular Movements: EOM normal.     Conjunctiva/sclera: Conjunctivae normal.     Pupils: Pupils are equal, round, and reactive to light.  Neck:     Thyroid: No thyroid mass or thyromegaly.     Vascular: No carotid bruit.     Trachea: Trachea normal.  Cardiovascular:     Rate and Rhythm: Normal rate and regular rhythm.     Pulses: Normal pulses and intact distal pulses.     Heart sounds: Normal heart sounds, S1 normal and S2 normal. No murmur heard. No friction rub. No gallop.   Pulmonary:     Effort: Pulmonary effort is normal. No tachypnea or respiratory distress.     Breath sounds: Normal breath sounds. No decreased breath sounds, wheezing, rhonchi or rales.  Abdominal:     General: Bowel sounds are normal.     Palpations: Abdomen is soft.     Tenderness: There is no abdominal tenderness.  Musculoskeletal:     Cervical back: Normal range of motion and neck supple.  Skin:    General: Skin is warm, dry and intact.     Findings: No rash.  Neurological:     Mental Status: She is alert.  Psychiatric:        Mood and Affect: Mood is not anxious or depressed.        Speech: Speech normal.        Behavior: Behavior normal. Behavior is cooperative.        Thought Content: Thought content normal.        Cognition and Memory: Cognition and memory normal.        Judgment: Judgment normal.       Results for orders placed or performed in visit on 08/15/20  Lipid panel  Result Value Ref Range   Cholesterol 194 0 - 200 mg/dL   Triglycerides 196.0 (H) 0.0 - 149.0 mg/dL   HDL 39.70 >39.00 mg/dL   VLDL 39.2 0.0 -  40.0 mg/dL   LDL Cholesterol 115 (H) 0 - 99 mg/dL   Total CHOL/HDL Ratio 5    NonHDL 153.80   TSH  Result Value Ref Range   TSH 3.80 0.35 - 4.50 uIU/mL  CBC  Result Value Ref Range   WBC 8.8 4.0 - 10.5 K/uL   RBC 4.57  3.87 - 5.11 Mil/uL   Platelets 324.0 150.0 - 400.0 K/uL   Hemoglobin 14.1 12.0 - 15.0 g/dL   HCT 42.4 36.0 - 46.0 %   MCV 92.9 78.0 - 100.0 fl   MCHC 33.2 30.0 - 36.0 g/dL   RDW 12.8 11.5 - 15.5 %  Comprehensive metabolic panel  Result Value Ref Range   Sodium 141 135 - 145 mEq/L   Potassium 3.5 3.5 - 5.1 mEq/L   Chloride 99 96 - 112 mEq/L   CO2 32 19 - 32 mEq/L   Glucose, Bld 95 70 - 99 mg/dL   BUN 23 6 - 23 mg/dL   Creatinine, Ser 0.99 0.40 - 1.20 mg/dL   Total Bilirubin 0.8 0.2 - 1.2 mg/dL   Alkaline Phosphatase 82 39 - 117 U/L   AST 21 0 - 37 U/L   ALT 17 0 - 35 U/L   Total Protein 6.6 6.0 - 8.3 g/dL   Albumin 4.4 3.5 - 5.2 g/dL   GFR 56.52 (L) >60.00 mL/min   Calcium 9.8 8.4 - 10.5 mg/dL    This visit occurred during the SARS-CoV-2 public health emergency.  Safety protocols were in place, including screening questions prior to the visit, additional usage of staff PPE, and extensive cleaning of exam room while observing appropriate contact time as indicated for disinfecting solutions.   COVID 19 screen:  No recent travel or known exposure to COVID19 The patient denies respiratory symptoms of COVID 19 at this time. The importance of social distancing was discussed today.   Assessment and Plan     Eliezer Lofts, MD

## 2020-11-05 LAB — URINE CULTURE
MICRO NUMBER:: 11580353
SPECIMEN QUALITY:: ADEQUATE

## 2021-01-17 ENCOUNTER — Encounter: Payer: Self-pay | Admitting: Family Medicine

## 2021-01-19 DIAGNOSIS — M5416 Radiculopathy, lumbar region: Secondary | ICD-10-CM | POA: Diagnosis not present

## 2021-02-14 ENCOUNTER — Other Ambulatory Visit: Payer: Self-pay | Admitting: Family Medicine

## 2021-02-14 DIAGNOSIS — G43109 Migraine with aura, not intractable, without status migrainosus: Secondary | ICD-10-CM

## 2021-03-09 DIAGNOSIS — Z1211 Encounter for screening for malignant neoplasm of colon: Secondary | ICD-10-CM | POA: Diagnosis not present

## 2021-03-09 LAB — COLOGUARD: Cologuard: NEGATIVE

## 2021-03-15 LAB — COLOGUARD: COLOGUARD: NEGATIVE

## 2021-03-16 ENCOUNTER — Telehealth: Payer: Self-pay

## 2021-03-16 ENCOUNTER — Encounter: Payer: Self-pay | Admitting: Family Medicine

## 2021-03-16 NOTE — Telephone Encounter (Signed)
Left detailed VM (DPR) with cologuard results.

## 2021-03-31 ENCOUNTER — Telehealth: Payer: Medicare Other | Admitting: Physician Assistant

## 2021-03-31 DIAGNOSIS — R3989 Other symptoms and signs involving the genitourinary system: Secondary | ICD-10-CM

## 2021-03-31 MED ORDER — CEPHALEXIN 500 MG PO CAPS: 500.0000 mg | ORAL_CAPSULE | Freq: Two times a day (BID) | ORAL | 0 refills | Status: DC

## 2021-03-31 NOTE — Progress Notes (Signed)

## 2021-04-13 ENCOUNTER — Other Ambulatory Visit: Payer: Self-pay | Admitting: Family Medicine

## 2021-04-14 NOTE — Telephone Encounter (Signed)
Last filled on 08/16/20 # 10 with 0 refill LOV 11/04/20 acute visit 08/22/20 AWV  No future appointments

## 2021-04-16 ENCOUNTER — Encounter: Payer: Self-pay | Admitting: Family Medicine

## 2021-04-25 DIAGNOSIS — G894 Chronic pain syndrome: Secondary | ICD-10-CM | POA: Diagnosis not present

## 2021-04-27 DIAGNOSIS — Z1231 Encounter for screening mammogram for malignant neoplasm of breast: Secondary | ICD-10-CM | POA: Diagnosis not present

## 2021-04-27 DIAGNOSIS — Z6821 Body mass index (BMI) 21.0-21.9, adult: Secondary | ICD-10-CM | POA: Diagnosis not present

## 2021-04-27 LAB — HM MAMMOGRAPHY

## 2021-06-06 DIAGNOSIS — M1712 Unilateral primary osteoarthritis, left knee: Secondary | ICD-10-CM | POA: Diagnosis not present

## 2021-06-27 NOTE — Progress Notes (Signed)
HPI: FU hypertension; also with cerebrovascular disease followed by vascular surgery.  Carotid Dopplers May 2021 showed 1 to 39% bilateral stenosis.  Since last seen, she denies dyspnea, chest pain, palpitations or syncope.  Current Outpatient Medications  Medication Sig Dispense Refill   Acetaminophen (TYLENOL) 325 MG CAPS Tylenol     aspirin 81 MG tablet Take 81 mg by mouth daily.     Cholecalciferol (VITAMIN D3 PO) Vitamin D3     Coenzyme Q10 (CO Q10) 100 MG TABS Take 1 tablet by mouth every other day.     KLOR-CON M20 20 MEQ tablet TAKE 1 TABLET (20 MEQ TOTAL) BY MOUTH EVERY OTHER DAY. 45 tablet 10   loratadine-pseudoephedrine (CLARITIN-D 12-HOUR) 5-120 MG per tablet Take 1 tablet by mouth daily as needed.      metoprolol succinate (TOPROL-XL) 25 MG 24 hr tablet TAKE 1 TABLET BY MOUTH EVERY DAY 90 tablet 3   rosuvastatin (CRESTOR) 5 MG tablet TAKE 1 TABLET BY MOUTH EVERY OTHER DAY **NEEDS PA* 45 tablet 7   triamterene-hydrochlorothiazide (MAXZIDE) 75-50 MG per tablet Take 1 tablet by mouth daily. 100 tablet 3   zolmitriptan (ZOMIG-ZMT) 2.5 MG disintegrating tablet TAKE 1 TABLET BY MOUTH AS NEEDED FOR MIGRAINE. 10 tablet 0   cephALEXin (KEFLEX) 500 MG capsule Take 1 capsule (500 mg total) by mouth 2 (two) times daily. (Patient not taking: Reported on 07/04/2021) 14 capsule 0   zonisamide (ZONEGRAN) 100 MG capsule TAKE 3 CAPSULES BY MOUTH EVERY DAY (Patient not taking: Reported on 07/04/2021) 270 capsule 3   No current facility-administered medications for this visit.     Past Medical History:  Diagnosis Date   Arthritis    Carotid artery occlusion    CEREBROVASCULAR DISEASE    HYPERLIPIDEMIA    Hypertension    Migraine     Past Surgical History:  Procedure Laterality Date   ABDOMINAL HYSTERECTOMY  2010   BREAST BIOPSY  1987   Status post bilateral tubal ligation.      Social History   Socioeconomic History   Marital status: Married    Spouse name: [name]    Number  of children: 2   Years of education: College   Highest education level: Not on file  Occupational History   Not on file  Tobacco Use   Smoking status: Former    Packs/day: 0.25    Years: 3.00    Pack years: 0.75    Types: Cigarettes    Quit date: 09/10/1977    Years since quitting: 43.8   Smokeless tobacco: Never  Vaping Use   Vaping Use: Never used  Substance and Sexual Activity   Alcohol use: Yes    Alcohol/week: 1.0 standard drink    Types: 1 Standard drinks or equivalent per week    Comment: occasionally a beer, 1-2 at a time   Drug use: No   Sexual activity: Yes    Birth control/protection: Post-menopausal, Surgical  Other Topics Concern   Not on file  Social History Narrative   She has remote history of minimal tobacco use, but has     not smoked in years.  She rarely consumes alcohol.    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[employer]      Family: has 2 adult children - Production assistant, radio and Rob - 2 grandchildren  Enjoys: play golf, spend time with friends      Exercise: walking - not as much as she should   Diet: egg and toast, works through lunch but eats, healthy food at dinner      Safety   Seat belts: Yes    Guns: No   Safe in relationships: Yes    Social Determinants of Radio broadcast assistant Strain: Not on file  Food Insecurity: Not on file  Transportation Needs: Not on file  Physical Activity: Not on file  Stress: Not on file  Social Connections: Not on file  Intimate Partner Violence: Not on file    Family History  Problem Relation Age of Onset   Breast cancer Mother 78   Hyperlipidemia Father    Stroke Father 19    ROS: no fevers or chills, productive cough, hemoptysis, dysphasia, odynophagia, melena, hematochezia, dysuria, hematuria, rash, seizure activity, orthopnea, PND, pedal edema, claudication. Remaining systems are negative.  Physical  Exam: Well-developed well-nourished in no acute distress.  Skin is warm and dry.  HEENT is normal.  Neck is supple.  Chest is clear to auscultation with normal expansion.  Cardiovascular exam is regular rate and rhythm.  Abdominal exam nontender or distended. No masses palpated. Extremities show no edema. neuro grossly intact  ECG-normal sinus rhythm at a rate of 72, no ST changes.  Personally reviewed  A/P  1 hypertension-patient's blood pressure is controlled.  Continue present medical regimen.  Check potassium and renal function.  2 hyperlipidemia-continue statin.  Check lipids and liver.  3 history of carotid disease-mild on most recent Dopplers.  Patient is followed by vascular surgery.  Kirk Ruths, MD

## 2021-07-04 ENCOUNTER — Encounter: Payer: Self-pay | Admitting: Cardiology

## 2021-07-04 ENCOUNTER — Other Ambulatory Visit: Payer: Self-pay

## 2021-07-04 ENCOUNTER — Ambulatory Visit: Payer: Medicare Other | Admitting: Cardiology

## 2021-07-04 VITALS — BP 140/83 | HR 72 | Ht 69.0 in | Wt 151.6 lb

## 2021-07-04 DIAGNOSIS — E7800 Pure hypercholesterolemia, unspecified: Secondary | ICD-10-CM

## 2021-07-04 DIAGNOSIS — I10 Essential (primary) hypertension: Secondary | ICD-10-CM

## 2021-07-04 DIAGNOSIS — I779 Disorder of arteries and arterioles, unspecified: Secondary | ICD-10-CM

## 2021-07-04 NOTE — Patient Instructions (Signed)

## 2021-08-24 ENCOUNTER — Encounter: Payer: Self-pay | Admitting: Family Medicine

## 2021-08-25 ENCOUNTER — Other Ambulatory Visit: Payer: Self-pay | Admitting: Cardiology

## 2021-09-12 ENCOUNTER — Other Ambulatory Visit: Payer: Self-pay | Admitting: Cardiology

## 2021-10-18 DIAGNOSIS — M1712 Unilateral primary osteoarthritis, left knee: Secondary | ICD-10-CM | POA: Diagnosis not present

## 2021-10-18 DIAGNOSIS — M25561 Pain in right knee: Secondary | ICD-10-CM | POA: Diagnosis not present

## 2021-10-18 DIAGNOSIS — E7800 Pure hypercholesterolemia, unspecified: Secondary | ICD-10-CM | POA: Diagnosis not present

## 2021-10-18 LAB — COMPREHENSIVE METABOLIC PANEL
ALT: 15 IU/L (ref 0–32)
AST: 21 IU/L (ref 0–40)
Albumin/Globulin Ratio: 2.4 — ABNORMAL HIGH (ref 1.2–2.2)
Albumin: 4.6 g/dL (ref 3.7–4.7)
Alkaline Phosphatase: 112 IU/L (ref 44–121)
BUN/Creatinine Ratio: 21 (ref 12–28)
BUN: 21 mg/dL (ref 8–27)
Bilirubin Total: 0.8 mg/dL (ref 0.0–1.2)
CO2: 30 mmol/L — ABNORMAL HIGH (ref 20–29)
Calcium: 9.9 mg/dL (ref 8.7–10.3)
Chloride: 98 mmol/L (ref 96–106)
Creatinine, Ser: 0.99 mg/dL (ref 0.57–1.00)
Globulin, Total: 1.9 g/dL (ref 1.5–4.5)
Glucose: 99 mg/dL (ref 70–99)
Potassium: 3.9 mmol/L (ref 3.5–5.2)
Sodium: 143 mmol/L (ref 134–144)
Total Protein: 6.5 g/dL (ref 6.0–8.5)
eGFR: 60 mL/min/{1.73_m2} (ref >59)

## 2021-10-18 LAB — LIPID PANEL
Chol/HDL Ratio: 3.6 ratio (ref 0.0–4.4)
Cholesterol, Total: 158 mg/dL (ref 100–199)
HDL: 44 mg/dL (ref >39)
LDL Chol Calc (NIH): 93 mg/dL (ref 0–99)
Triglycerides: 117 mg/dL (ref 0–149)
VLDL Cholesterol Cal: 21 mg/dL (ref 5–40)

## 2022-01-03 DIAGNOSIS — L570 Actinic keratosis: Secondary | ICD-10-CM | POA: Diagnosis not present

## 2022-01-03 DIAGNOSIS — C44622 Squamous cell carcinoma of skin of right upper limb, including shoulder: Secondary | ICD-10-CM | POA: Diagnosis not present

## 2022-01-03 DIAGNOSIS — X32XXXD Exposure to sunlight, subsequent encounter: Secondary | ICD-10-CM | POA: Diagnosis not present

## 2022-01-12 ENCOUNTER — Ambulatory Visit (INDEPENDENT_AMBULATORY_CARE_PROVIDER_SITE_OTHER): Payer: Medicare Other

## 2022-01-12 VITALS — Ht 69.0 in | Wt 151.0 lb

## 2022-01-12 DIAGNOSIS — Z0000 Encounter for general adult medical examination without abnormal findings: Secondary | ICD-10-CM | POA: Diagnosis not present

## 2022-01-12 NOTE — Patient Instructions (Addendum)
?Ms. Andringa , ?Thank you for taking time to come for your Medicare Wellness Visit. I appreciate your ongoing commitment to your health goals. Please review the following plan we discussed and let me know if I can assist you in the future.  ? ?These are the goals we discussed: ? Goals   ? ?   Maintain healthy active lifestyle. (pt-stated)   ?   Maintain good health ?  ? ?  ?  ?This is a list of the screening recommended for you and due dates:  ?Health Maintenance  ?Topic Date Due  ? COVID-19 Vaccine (5 - Booster for Pfizer series) 01/28/2022*  ? Zoster (Shingles) Vaccine (1 of 2) 04/14/2022*  ? Tetanus Vaccine  02/10/2022  ? Flu Shot  04/10/2022  ? Cologuard (Stool DNA test)  03/09/2024  ? Pneumonia Vaccine  Completed  ? DEXA scan (bone density measurement)  Completed  ? Hepatitis C Screening: USPSTF Recommendation to screen - Ages 48-79 yo.  Completed  ? HPV Vaccine  Aged Out  ? Colon Cancer Screening  Discontinued  ?*Topic was postponed. The date shown is not the original due date.  ? ?Advanced directives: Yes Patient will submit copy ? ?Conditions/risks identified: None ? ?Next appointment: Follow up in one year for your annual wellness visit  ? ? ?Preventive Care 43 Years and Older, Female ?Preventive care refers to lifestyle choices and visits with your health care provider that can promote health and wellness. ?What does preventive care include? ?A yearly physical exam. This is also called an annual well check. ?Dental exams once or twice a year. ?Routine eye exams. Ask your health care provider how often you should have your eyes checked. ?Personal lifestyle choices, including: ?Daily care of your teeth and gums. ?Regular physical activity. ?Eating a healthy diet. ?Avoiding tobacco and drug use. ?Limiting alcohol use. ?Practicing safe sex. ?Taking low-dose aspirin every day. ?Taking vitamin and mineral supplements as recommended by your health care provider. ?What happens during an annual well check? ?The  services and screenings done by your health care provider during your annual well check will depend on your age, overall health, lifestyle risk factors, and family history of disease. ?Counseling  ?Your health care provider may ask you questions about your: ?Alcohol use. ?Tobacco use. ?Drug use. ?Emotional well-being. ?Home and relationship well-being. ?Sexual activity. ?Eating habits. ?History of falls. ?Memory and ability to understand (cognition). ?Work and work Statistician. ?Reproductive health. ?Screening  ?You may have the following tests or measurements: ?Height, weight, and BMI. ?Blood pressure. ?Lipid and cholesterol levels. These may be checked every 5 years, or more frequently if you are over 15 years old. ?Skin check. ?Lung cancer screening. You may have this screening every year starting at age 2 if you have a 30-pack-year history of smoking and currently smoke or have quit within the past 15 years. ?Fecal occult blood test (FOBT) of the stool. You may have this test every year starting at age 28. ?Flexible sigmoidoscopy or colonoscopy. You may have a sigmoidoscopy every 5 years or a colonoscopy every 10 years starting at age 43. ?Hepatitis C blood test. ?Hepatitis B blood test. ?Sexually transmitted disease (STD) testing. ?Diabetes screening. This is done by checking your blood sugar (glucose) after you have not eaten for a while (fasting). You may have this done every 1-3 years. ?Bone density scan. This is done to screen for osteoporosis. You may have this done starting at age 69. ?Mammogram. This may be done every 1-2 years. Talk  to your health care provider about how often you should have regular mammograms. ?Talk with your health care provider about your test results, treatment options, and if necessary, the need for more tests. ?Vaccines  ?Your health care provider may recommend certain vaccines, such as: ?Influenza vaccine. This is recommended every year. ?Tetanus, diphtheria, and acellular  pertussis (Tdap, Td) vaccine. You may need a Td booster every 10 years. ?Zoster vaccine. You may need this after age 72. ?Pneumococcal 13-valent conjugate (PCV13) vaccine. One dose is recommended after age 105. ?Pneumococcal polysaccharide (PPSV23) vaccine. One dose is recommended after age 35. ?Talk to your health care provider about which screenings and vaccines you need and how often you need them. ?This information is not intended to replace advice given to you by your health care provider. Make sure you discuss any questions you have with your health care provider. ?Document Released: 09/23/2015 Document Revised: 05/16/2016 Document Reviewed: 06/28/2015 ?Elsevier Interactive Patient Education ? 2017 [place]. ? ?Fall Prevention in the Home ?Falls can cause injuries. They can happen to people of all ages. There are many things you can do to make your home safe and to help prevent falls. ?What can I do on the outside of my home? ?Regularly fix the edges of walkways and driveways and fix any cracks. ?Remove anything that might make you trip as you walk through a door, such as a raised step or threshold. ?Trim any bushes or trees on the path to your home. ?Use bright outdoor lighting. ?Clear any walking paths of anything that might make someone trip, such as rocks or tools. ?Regularly check to see if handrails are loose or broken. Make sure that both sides of any steps have handrails. ?Any raised decks and porches should have guardrails on the edges. ?Have any leaves, snow, or ice cleared regularly. ?Use sand or salt on walking paths during winter. ?Clean up any spills in your garage right away. This includes oil or grease spills. ?What can I do in the bathroom? ?Use night lights. ?Install grab bars by the toilet and in the tub and shower. Do not use towel bars as grab bars. ?Use non-skid mats or decals in the tub or shower. ?If you need to sit down in the shower, use a plastic, non-slip stool. ?Keep the floor  dry. Clean up any water that spills on the floor as soon as it happens. ?Remove soap buildup in the tub or shower regularly. ?Attach bath mats securely with double-sided non-slip rug tape. ?Do not have throw rugs and other things on the floor that can make you trip. ?What can I do in the bedroom? ?Use night lights. ?Make sure that you have a light by your bed that is easy to reach. ?Do not use any sheets or blankets that are too big for your bed. They should not hang down onto the floor. ?Have a firm chair that has side arms. You can use this for support while you get dressed. ?Do not have throw rugs and other things on the floor that can make you trip. ?What can I do in the kitchen? ?Clean up any spills right away. ?Avoid walking on wet floors. ?Keep items that you use a lot in easy-to-reach places. ?If you need to reach something above you, use a strong step stool that has a grab bar. ?Keep electrical cords out of the way. ?Do not use floor polish or wax that makes floors slippery. If you must use wax, use non-skid floor wax. ?Do  not have throw rugs and other things on the floor that can make you trip. ?What can I do with my stairs? ?Do not leave any items on the stairs. ?Make sure that there are handrails on both sides of the stairs and use them. Fix handrails that are broken or loose. Make sure that handrails are as long as the stairways. ?Check any carpeting to make sure that it is firmly attached to the stairs. Fix any carpet that is loose or worn. ?Avoid having throw rugs at the top or bottom of the stairs. If you do have throw rugs, attach them to the floor with carpet tape. ?Make sure that you have a light switch at the top of the stairs and the bottom of the stairs. If you do not have them, ask someone to add them for you. ?What else can I do to help prevent falls? ?Wear shoes that: ?Do not have high heels. ?Have rubber bottoms. ?Are comfortable and fit you well. ?Are closed at the toe. Do not wear  sandals. ?If you use a stepladder: ?Make sure that it is fully opened. Do not climb a closed stepladder. ?Make sure that both sides of the stepladder are locked into place. ?Ask someone to hold it for you, if possibl

## 2022-01-12 NOTE — Progress Notes (Signed)
? ?Subjective:  ? Christine Daniels is a 76 y.o. female who presents for Medicare Annual (Subsequent) preventive examination. ? ?Review of Systems    ?Virtual Visit via Telephone Note ? ?I connected with  [name] on 01/12/22 at  8:15 AM EDT by telephone and verified that I am speaking with the correct person using two identifiers. ? ?Location: ?Patient: Home ?Provider: Office ?Persons participating in the virtual visit: patient/Nurse Health Advisor ?  ?I discussed the limitations, risks, security and privacy concerns of performing an evaluation and management service by telephone and the availability of in person appointments. The patient expressed understanding and agreed to proceed. ? ?Interactive audio and video telecommunications were attempted between this nurse and patient, however failed, due to patient having technical difficulties OR patient did not have access to video capability.  We continued and completed visit with audio only. ? ?Some vital signs may be absent or patient reported.  ? ?Criselda Peaches, LPN  ?Cardiac Risk Factors include: advanced age (>38mn, >>67women);hypertension ? ?   ?Objective:  ?  ?Today's Vitals  ? 01/12/22 05284 ?Weight: 151 lb (68.5 kg)  ?Height: '5\' 9"'$  (1.753 m)  ? ?Body mass index is 22.3 kg/m?. ? ? ?  01/12/2022  ?  8:30 AM 08/22/2020  ?  8:42 AM 04/16/2018  ?  9:11 AM 08/24/2015  ?  9:19 AM 08/25/2014  ?  9:51 AM  ?Advanced Directives  ?Does Patient Have a Medical Advance Directive? Yes Yes Yes Yes Yes  ?Type of [place] Healthcare Power of [place] will [place] will Living will;Healthcare Power of Attorney  ?Does patient want to make changes to medical advance directive? No - Patient declined No - Patient declined   No - Patient declined  ?Copy of [place]? No - copy requested No - copy requested No - copy requested  No - copy  requested  ? ? ?Current Medications (verified) ?Outpatient Encounter Medications as of 01/12/2022  ?Medication Sig  ? Acetaminophen (TYLENOL) 325 MG CAPS Tylenol  ? Coenzyme Q10 (CO Q10) 100 MG TABS Take 1 tablet by mouth every other day.  ? KLOR-CON M20 20 MEQ tablet TAKE 1 TABLET (20 MEQ TOTAL) BY MOUTH EVERY OTHER DAY.  ? loratadine-pseudoephedrine (CLARITIN-D 12-HOUR) 5-120 MG per tablet Take 1 tablet by mouth daily as needed.   ? metoprolol succinate (TOPROL-XL) 25 MG 24 hr tablet TAKE 1 TABLET BY MOUTH EVERY DAY  ? rosuvastatin (CRESTOR) 5 MG tablet TAKE 1 TABLET BY MOUTH EVERY OTHER DAY  ? triamterene-hydrochlorothiazide (MAXZIDE) 75-50 MG per tablet Take 1 tablet by mouth daily.  ? [DISCONTINUED] aspirin 81 MG tablet Take 81 mg by mouth daily.  ? [DISCONTINUED] cephALEXin (KEFLEX) 500 MG capsule Take 1 capsule (500 mg total) by mouth 2 (two) times daily. (Patient not taking: Reported on 07/04/2021)  ? [DISCONTINUED] Cholecalciferol (VITAMIN D3 PO) Vitamin D3  ? [DISCONTINUED] zolmitriptan (ZOMIG-ZMT) 2.5 MG disintegrating tablet TAKE 1 TABLET BY MOUTH AS NEEDED FOR MIGRAINE.  ? [DISCONTINUED] zonisamide (ZONEGRAN) 100 MG capsule TAKE 3 CAPSULES BY MOUTH EVERY DAY (Patient not taking: Reported on 07/04/2021)  ? ?No facility-administered encounter medications on file as of 01/12/2022.  ? ? ?Allergies (verified) ?Patient has no known allergies.  ? ?History: ?Past Medical History:  ?Diagnosis Date  ? Arthritis   ? Carotid artery occlusion   ? CEREBROVASCULAR DISEASE   ? HYPERLIPIDEMIA   ?  Hypertension   ? Migraine   ? ?Past Surgical History:  ?Procedure Laterality Date  ? ABDOMINAL HYSTERECTOMY  2010  ? BREAST BIOPSY  1987  ? Status post bilateral tubal ligation.    ? ?Family History  ?Problem Relation Age of Onset  ? Breast cancer Mother 85  ? Hyperlipidemia Father   ? Stroke Father 27  ? ?Social History  ? ?Socioeconomic History  ? Marital status: Married  ?  Spouse name: Christine Daniels   ? Number of children: 2  ? Years of  education: College  ? Highest education level: Not on file  ?Occupational History  ? Not on file  ?Tobacco Use  ? Smoking status: Former  ?  Packs/day: 0.25  ?  Years: 3.00  ?  Pack years: 0.75  ?  Types: Cigarettes  ?  Quit date: 09/10/1977  ?  Years since quitting: 44.3  ? Smokeless tobacco: Never  ?Vaping Use  ? Vaping Use: Never used  ?Substance and Sexual Activity  ? Alcohol use: Yes  ?  Alcohol/week: 1.0 standard drink  ?  Types: 1 Standard drinks or equivalent per week  ?  Comment: occasionally a beer, 1-2 at a time  ? Drug use: No  ? Sexual activity: Yes  ?  Birth control/protection: Post-menopausal, Surgical  ?Other Topics Concern  ? Not on file  ?Social History Narrative  ? She has remote history of minimal tobacco use, but has   ?  not smoked in years.  She rarely consumes alcohol.   ? Desires CPR  ?   ? 01/28/20  ? From: the area  ? Living: with husband, Christine Daniels [phone])   ? 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   ? Family: has 2 adult children - Christine Daniels and Christine Daniels - 2 grandchildren  ?   ? Enjoys: play golf, spend time with friends  ?   ? Exercise: walking - not as much as she should  ? Diet: egg and toast, works through lunch but eats, healthy food at dinner  ?   ? Safety  ? Seat belts: Yes   ? Guns: No  ? Safe in relationships: Yes   ? ?Social Determinants of Health  ? ?Financial Resource Strain: Low Risk   ? Difficulty of Paying Living Expenses: Not hard at all  ?Food Insecurity: No Food Insecurity  ? Worried About Charity fundraiser in the Last Year: Never true  ? Ran Out of Food in the Last Year: Never true  ?Transportation Needs: No Transportation Needs  ? Lack of Transportation (Medical): No  ? Lack of Transportation (Non-Medical): No  ?Physical Activity: Insufficiently Active  ? Days of Exercise per Week: 3 days  ? Minutes of Exercise per Session: 30 min  ?Stress: No Stress Concern Present  ? Feeling of Stress : Not at all  ?Social Connections: Socially Integrated  ? Frequency of  Communication with Friends and Family: More than three times a week  ? Frequency of Social Gatherings with Friends and Family: More than three times a week  ? Attends Religious Services: More than 4 times per year  ? Active Member of Clubs or Organizations: Yes  ? Attends Archivist Meetings: More than 4 times per year  ? Marital Status: Married  ? ? ?Tobacco Counseling ?Counseling given: Not Answered ? ? ?Clinical Intake: ? ?Pre-visit preparation completed: No ? ?Diabetic?  No ? ?Interpreter Needed?: No ?Activities of Daily Living ? ?  01/12/2022  ?  8:28 AM  ?In your present state of health, do you have any difficulty performing the following activities:  ?Hearing? 0  ?Vision? 0  ?Difficulty concentrating or making decisions? 0  ?Walking or climbing stairs? 0  ?Dressing or bathing? 0  ?Doing errands, shopping? 0  ?Preparing Food and eating ? N  ?Using the Toilet? N  ?In the past six months, have you accidently leaked urine? N  ?Do you have problems with loss of bowel control? N  ?Managing your Medications? N  ?Managing your Finances? N  ?Housekeeping or managing your Housekeeping? N  ? ? ?Patient Care Team: ?Lesleigh Noe, MD as PCP - General (Family Medicine) ?Suella Broad, MD (Physical Medicine and Rehabilitation) ?Lelon Perla, MD as Consulting Physician (Cardiology) ?Angelia Mould, MD as Consulting Physician (Vascular Surgery) ?Vania Rea, MD as Consulting Physician (Obstetrics and Gynecology) ?Levy Sjogren, MD as Referring Physician (Dermatology) ? ?Indicate any recent Medical Services you may have received from other than [facility] providers in the past year (date may be approximate). ? ?   ?Assessment:  ? This is a routine wellness examination for Tauna. ? ?Hearing/Vision screen ?Hearing Screening - Comments:: No hearing difficulty ?Vision Screening - Comments:: Wears glasses followed by Dr Nicki Reaper ? ?Dietary issues and exercise activities discussed: ?Exercise limited by:  None identified ? ? Goals Addressed   ? ?  ?  ?  ?  ?  ? This Visit's Progress  ?   Maintain healthy active lifestyle. (pt-stated)     ?   Maintain good health ?  ? ?  ? ?Depression Screen ? ?  01/12/2022  ?  8:26

## 2022-01-15 NOTE — Progress Notes (Signed)
I reviewed health advisor's note, was available for consultation, and agree with documentation and plan.  

## 2022-02-10 DIAGNOSIS — M5412 Radiculopathy, cervical region: Secondary | ICD-10-CM | POA: Diagnosis not present

## 2022-02-27 DIAGNOSIS — M1711 Unilateral primary osteoarthritis, right knee: Secondary | ICD-10-CM | POA: Diagnosis not present

## 2022-02-27 DIAGNOSIS — M1712 Unilateral primary osteoarthritis, left knee: Secondary | ICD-10-CM | POA: Diagnosis not present

## 2022-03-19 ENCOUNTER — Other Ambulatory Visit: Payer: Self-pay | Admitting: Cardiology

## 2022-03-27 DIAGNOSIS — M1712 Unilateral primary osteoarthritis, left knee: Secondary | ICD-10-CM | POA: Diagnosis not present

## 2022-04-13 ENCOUNTER — Encounter: Payer: Self-pay | Admitting: Family Medicine

## 2022-04-17 ENCOUNTER — Ambulatory Visit (INDEPENDENT_AMBULATORY_CARE_PROVIDER_SITE_OTHER): Payer: Medicare Other | Admitting: Family Medicine

## 2022-04-17 ENCOUNTER — Encounter: Payer: Self-pay | Admitting: Family Medicine

## 2022-04-17 VITALS — BP 138/70 | HR 92 | Temp 97.6°F | Ht 69.0 in | Wt 158.0 lb

## 2022-04-17 DIAGNOSIS — E785 Hyperlipidemia, unspecified: Secondary | ICD-10-CM | POA: Diagnosis not present

## 2022-04-17 DIAGNOSIS — I10 Essential (primary) hypertension: Secondary | ICD-10-CM

## 2022-04-17 DIAGNOSIS — G43109 Migraine with aura, not intractable, without status migrainosus: Secondary | ICD-10-CM | POA: Diagnosis not present

## 2022-04-17 NOTE — Patient Instructions (Addendum)
Headache Prevention  Riboflavin has been shown to be effective in reducing the frequency of migraine headaches. This is a supplement that is safe and well tolerated. The only side effect is a change in urine color.   Take 400 mg of Riboflavin daily.   I recommend picking up a bottle and taking the medication until you finish the bottle. If it helped, continued. If no improvement or change, then do not purchase another bottle.    Update if no improvement and can start Zonisamide:

## 2022-04-17 NOTE — Assessment & Plan Note (Signed)
Lab Results  Component Value Date   LDLCALC 93 10/18/2021   At goal, continue Crestor 5 mg other day.

## 2022-04-17 NOTE — Assessment & Plan Note (Signed)
Recurrence of prior regular migraines, getting approximately 5/month.  She was on zonisamide, discussed trial of riboflavin to avoid possible side effects.  She will start riboflavin 400 mg and update in 6 to 8 weeks if it has not reduce the number of migraines.  She was also previously on Zomig for abortive therapy, but has not needed anything other than Tylenol to stop the migraines.

## 2022-04-17 NOTE — Progress Notes (Signed)
Subjective:     Lubna Stegeman Weinhold is a 76 y.o. female presenting for Migraine (Patient states she was on a preventative medication but stopped taking it about 1 year ago. Migraines are starting to creep back in. )     Migraine  This is a chronic problem. The current episode started more than 1 month ago. The problem occurs intermittently. The pain is located in the Right unilateral (can also be left or right) region. The pain quality is similar to prior headaches. The quality of the pain is described as aching (pressure). Associated symptoms include photophobia and sinus pressure. Pertinent negatives include no coughing, dizziness, ear pain, eye pain, fever, nausea, numbness, phonophobia, sore throat, tingling or vomiting.    5 migraines per month for the last couple of months Takes tylenol with resolution of the HA  Was previously on zonisemide   Was also on topimax - but started getting eye twitching  Use to take zomig for migraine treatment  As she got older she started having fewer headaches and less often  Review of Systems  Constitutional:  Negative for fever.  HENT:  Positive for sinus pressure. Negative for ear pain and sore throat.   Eyes:  Positive for photophobia. Negative for pain.  Respiratory:  Negative for cough.   Gastrointestinal:  Negative for nausea and vomiting.  Neurological:  Negative for dizziness, tingling and numbness.     Social History   Tobacco Use  Smoking Status Former   Packs/day: 0.25   Years: 3.00   Total pack years: 0.75   Types: Cigarettes   Quit date: 09/10/1977   Years since quitting: 44.6  Smokeless Tobacco Never        Objective:    BP Readings from Last 3 Encounters:  04/17/22 138/70  07/04/21 140/83  11/04/20 (!) 150/80   Wt Readings from Last 3 Encounters:  04/17/22 158 lb (71.7 kg)  01/12/22 151 lb (68.5 kg)  07/04/21 151 lb 9.6 oz (68.8 kg)    BP 138/70 (BP Location: Left Arm, Patient Position: Sitting, Cuff Size:  Normal)   Pulse 92   Temp 97.6 F (36.4 C) (Temporal)   Ht '5\' 9"'$  (1.753 m)   Wt 158 lb (71.7 kg)   SpO2 98%   BMI 23.33 kg/m    Physical Exam Constitutional:      General: She is not in acute distress.    Appearance: She is well-developed. She is not diaphoretic.  HENT:     Right Ear: External ear normal.     Left Ear: External ear normal.     Nose: Nose normal.  Eyes:     Conjunctiva/sclera: Conjunctivae normal.  Cardiovascular:     Rate and Rhythm: Normal rate.  Pulmonary:     Effort: Pulmonary effort is normal.  Musculoskeletal:     Cervical back: Neck supple.  Skin:    General: Skin is warm and dry.     Capillary Refill: Capillary refill takes less than 2 seconds.  Neurological:     Mental Status: She is alert. Mental status is at baseline.  Psychiatric:        Mood and Affect: Mood normal.        Behavior: Behavior normal.     Lab Results  Component Value Date   CHOL 158 10/18/2021   HDL 44 10/18/2021   LDLCALC 93 10/18/2021   LDLDIRECT 166.0 05/31/2016   TRIG 117 10/18/2021   CHOLHDL 3.6 10/18/2021  Assessment & Plan:   Problem List Items Addressed This Visit       Cardiovascular and Mediastinum   Essential hypertension - Primary    Blood pressure controlled, sees Dr. Stanford Breed.  Continue metoprolol 25 mg, Maxzide 75-'50mg'$       Migraine    Recurrence of prior regular migraines, getting approximately 5/month.  She was on zonisamide, discussed trial of riboflavin to avoid possible side effects.  She will start riboflavin 400 mg and update in 6 to 8 weeks if it has not reduce the number of migraines.  She was also previously on Zomig for abortive therapy, but has not needed anything other than Tylenol to stop the migraines.        Other   HLD (hyperlipidemia)    Lab Results  Component Value Date   LDLCALC 93 10/18/2021  At goal, continue Crestor 5 mg other day.        Return if symptoms worsen or fail to improve.  Lesleigh Noe,  MD

## 2022-04-17 NOTE — Assessment & Plan Note (Signed)
Blood pressure controlled, sees Dr. Stanford Breed.  Continue metoprolol 25 mg, Maxzide 75-'50mg'$ 

## 2022-05-19 ENCOUNTER — Encounter: Payer: Self-pay | Admitting: Family Medicine

## 2022-05-19 DIAGNOSIS — G43109 Migraine with aura, not intractable, without status migrainosus: Secondary | ICD-10-CM

## 2022-05-22 NOTE — Addendum Note (Signed)
Addended by: Waunita Schooner R on: 05/22/2022 01:06 PM   Modules accepted: Orders

## 2022-05-22 NOTE — Telephone Encounter (Signed)
She notes her HA are exactly the same as the ones she's had all her life.   Discussed if new or changing HA - should do head imaging.   Will refer to neurology for persistent migraines

## 2022-05-23 ENCOUNTER — Encounter: Payer: Self-pay | Admitting: Neurology

## 2022-06-04 DIAGNOSIS — M1712 Unilateral primary osteoarthritis, left knee: Secondary | ICD-10-CM | POA: Diagnosis not present

## 2022-06-26 DIAGNOSIS — Z6822 Body mass index (BMI) 22.0-22.9, adult: Secondary | ICD-10-CM | POA: Diagnosis not present

## 2022-06-26 DIAGNOSIS — Z1231 Encounter for screening mammogram for malignant neoplasm of breast: Secondary | ICD-10-CM | POA: Diagnosis not present

## 2022-06-26 LAB — HM MAMMOGRAPHY

## 2022-07-04 NOTE — Progress Notes (Deleted)
NEUROLOGY CONSULTATION NOTE  Christine Daniels MRN: [id number] DOB: Dec 02, 1945  Referring provider: Waunita Schooner, MD Primary care provider: Waunita Schooner, MD  Reason for consult:  migraines  Assessment/Plan:   ***   Subjective:  Christine Daniels is a 76 year old female with HTN, cerebrovascular disease and HLD who presents for migraines.  History supplemented by referring provider's note.  Onset:  *** Location:  *** Quality:  *** Intensity:  ***.  *** denies new headache, thunderclap headache or severe headache that wakes *** from sleep. Aura:  *** Prodrome:  *** Postdrome:  *** Associated symptoms:  ***.  *** denies associated unilateral numbness or weakness. Duration:  *** Frequency:  *** Frequency of abortive medication: *** Triggers:  *** Relieving factors:  *** Activity:  ***  Her carotid arteries have been monitored via duplex for over several years.  Last carotid ultrasound on 01/27/2020 demonstrated non-hemodynamically significant plaque with 1-39% stenosis in the bilateral internal carotid arteries, as well as antegrade flow in both vertebral arteries and normal flow within both subclavian arteries.    Past NSAIDS/analgesics:  *** Past abortive triptans:  zolmitriptan tab Past abortive ergotamine:  *** Past muscle relaxants:  tizanidine, methocarbamol Past anti-emetic:  *** Past antihypertensive medications:  losartan Past antidepressant medications:  *** Past anticonvulsant medications:  zonisamide '300mg'$  daily Past anti-CGRP:  none Past vitamins/Herbal/Supplements:  *** Past antihistamines/decongestants:  none Other past therapies:  ***  Current NSAIDS/analgesics:  acetaminophen Current triptans:  none Current ergotamine:  none Current anti-emetic:  none Current muscle relaxants:  none Current Antihypertensive medications:  metoprolol succinate '25mg'$  QD Current Antidepressant medications:  none Current Anticonvulsant medications:  none Current anti-CGRP:   none Current Vitamins/Herbal/Supplements:  riboflavin '400mg'$  daily, CoQ10 '100mg'$  QOD Current Antihistamines/Decongestants:  loratadine-pseudoephedrine Other therapy:  *** Hormone/birth control:  none    Caffeine:  *** Alcohol:  *** Smoker:  *** Diet:  *** Exercise:  *** Depression:  ***; Anxiety:  *** Other pain:  *** Sleep hygiene:  *** Family history of headache:  ***      PAST MEDICAL HISTORY: Past Medical History:  Diagnosis Date   Arthritis    Carotid artery occlusion    CEREBROVASCULAR DISEASE    HYPERLIPIDEMIA    Hypertension    Migraine     PAST SURGICAL HISTORY: Past Surgical History:  Procedure Laterality Date   ABDOMINAL HYSTERECTOMY  2010   BREAST BIOPSY  1987   Status post bilateral tubal ligation.      MEDICATIONS: Current Outpatient Medications on File Prior to Visit  Medication Sig Dispense Refill   Acetaminophen (TYLENOL) 325 MG CAPS Tylenol     Coenzyme Q10 (CO Q10) 100 MG TABS Take 1 tablet by mouth every other day.     loratadine-pseudoephedrine (CLARITIN-D 12-HOUR) 5-120 MG per tablet Take 1 tablet by mouth daily as needed.      metoprolol succinate (TOPROL-XL) 25 MG 24 hr tablet TAKE 1 TABLET BY MOUTH EVERY DAY 90 tablet 3   potassium chloride SA (KLOR-CON M20) 20 MEQ tablet TAKE 1 TABLET (20 MEQ TOTAL) BY MOUTH EVERY OTHER DAY. 45 tablet 4   rosuvastatin (CRESTOR) 5 MG tablet TAKE 1 TABLET BY MOUTH EVERY OTHER DAY 45 tablet 7   triamterene-hydrochlorothiazide (MAXZIDE) 75-50 MG per tablet Take 1 tablet by mouth daily. 100 tablet 3   No current facility-administered medications on file prior to visit.    ALLERGIES: No Known Allergies  FAMILY HISTORY: Family History  Problem Relation Age of Onset  Breast cancer Mother 10   Hyperlipidemia Father    Stroke Father 40    Objective:  *** General: No acute distress.  Patient appears well-groomed.   Head:  Normocephalic/atraumatic Eyes:  fundi examined but not visualized Neck:  supple, no paraspinal tenderness, full range of motion Back: No paraspinal tenderness Heart: regular rate and rhythm Lungs: Clear to auscultation bilaterally. Vascular: No carotid bruits. Neurological Exam: Mental status: alert and oriented to person, place, and time, speech fluent and not dysarthric, language intact. Cranial nerves: CN I: not tested CN II: pupils equal, round and reactive to light, visual fields intact CN III, IV, VI:  full range of motion, no nystagmus, no ptosis CN V: facial sensation intact. CN VII: upper and lower face symmetric CN VIII: hearing intact CN IX, X: gag intact, uvula midline CN XI: sternocleidomastoid and trapezius muscles intact CN XII: tongue midline Bulk & Tone: normal, no fasciculations. Motor:  muscle strength 5/5 throughout Sensation:  Pinprick, temperature and vibratory sensation intact. Deep Tendon Reflexes:  2+ throughout,  toes downgoing.   Finger to nose testing:  Without dysmetria.   Heel to shin:  Without dysmetria.   Gait:  Normal station and stride.  Romberg negative.    Thank you for allowing me to take part in the care of this patient.  Metta Clines, DO  CC: ***

## 2022-07-06 ENCOUNTER — Encounter: Payer: Self-pay | Admitting: Family Medicine

## 2022-07-09 ENCOUNTER — Ambulatory Visit: Payer: Medicare Other | Admitting: Neurology

## 2022-08-15 ENCOUNTER — Encounter: Payer: Self-pay | Admitting: Cardiology

## 2022-08-15 DIAGNOSIS — I10 Essential (primary) hypertension: Secondary | ICD-10-CM

## 2022-08-15 MED ORDER — LOSARTAN POTASSIUM 50 MG PO TABS
50.0000 mg | ORAL_TABLET | Freq: Every day | ORAL | 3 refills | Status: DC
Start: 2022-08-15 — End: 2022-09-12

## 2022-08-17 ENCOUNTER — Encounter: Payer: Self-pay | Admitting: Cardiology

## 2022-08-24 DIAGNOSIS — I10 Essential (primary) hypertension: Secondary | ICD-10-CM | POA: Diagnosis not present

## 2022-08-24 LAB — BASIC METABOLIC PANEL
BUN/Creatinine Ratio: 20 (ref 12–28)
BUN: 24 mg/dL (ref 8–27)
CO2: 28 mmol/L (ref 20–29)
Calcium: 9.5 mg/dL (ref 8.7–10.3)
Chloride: 97 mmol/L (ref 96–106)
Creatinine, Ser: 1.21 mg/dL — ABNORMAL HIGH (ref 0.57–1.00)
Glucose: 94 mg/dL (ref 70–99)
Potassium: 4.4 mmol/L (ref 3.5–5.2)
Sodium: 138 mmol/L (ref 134–144)
eGFR: 47 mL/min/{1.73_m2} — ABNORMAL LOW (ref >59)

## 2022-09-05 ENCOUNTER — Other Ambulatory Visit: Payer: Self-pay | Admitting: Cardiology

## 2022-09-11 ENCOUNTER — Encounter: Payer: Self-pay | Admitting: Cardiology

## 2022-09-11 DIAGNOSIS — I10 Essential (primary) hypertension: Secondary | ICD-10-CM

## 2022-09-12 MED ORDER — LOSARTAN POTASSIUM 25 MG PO TABS: 25.0000 mg | ORAL_TABLET | Freq: Every day | ORAL | 3 refills | Status: DC

## 2022-09-12 MED ORDER — LOSARTAN POTASSIUM 25 MG PO TABS: 25.0000 mg | ORAL_TABLET | Freq: Every day | ORAL | Status: DC

## 2022-09-12 NOTE — Addendum Note (Signed)
Addended by: Cristopher Estimable on: 09/12/2022 04:25 PM   Modules accepted: Orders

## 2022-09-12 NOTE — Progress Notes (Signed)
HPI: FU hypertension; also with cerebrovascular disease followed by vascular surgery.  Carotid Dopplers May 2021 showed 1 to 39% bilateral stenosis.  Since last seen, she denies dyspnea, chest pain, palpitations or syncope.  Current Outpatient Medications  Medication Sig Dispense Refill   Acetaminophen (TYLENOL) 325 MG CAPS Tylenol     Coenzyme Q10 (CO Q10) 100 MG TABS Take 1 tablet by mouth every other day.     loratadine-pseudoephedrine (CLARITIN-D 12-HOUR) 5-120 MG per tablet Take 1 tablet by mouth daily as needed.      losartan (COZAAR) 50 MG tablet Take 1 tablet (50 mg total) by mouth daily. 90 tablet 3   metoprolol succinate (TOPROL-XL) 25 MG 24 hr tablet TAKE 1 TABLET BY MOUTH EVERY DAY 90 tablet 3   potassium chloride SA (KLOR-CON M20) 20 MEQ tablet TAKE 1 TABLET (20 MEQ TOTAL) BY MOUTH EVERY OTHER DAY. 45 tablet 4   rosuvastatin (CRESTOR) 5 MG tablet TAKE 1 TABLET BY MOUTH EVERY OTHER DAY 45 tablet 7   triamterene-hydrochlorothiazide (MAXZIDE) 75-50 MG per tablet Take 1 tablet by mouth daily. 100 tablet 3   No current facility-administered medications for this visit.     Past Medical History:  Diagnosis Date   Arthritis    Carotid artery occlusion    CEREBROVASCULAR DISEASE    HYPERLIPIDEMIA    Hypertension    Migraine     Past Surgical History:  Procedure Laterality Date   ABDOMINAL HYSTERECTOMY  2010   BREAST BIOPSY  1987   Status post bilateral tubal ligation.      Social History   Socioeconomic History   Marital status: Married    Spouse name: [name]    Number of children: 2   Years of education: College   Highest education level: Not on file  Occupational History   Not on file  Tobacco Use   Smoking status: Former    Packs/day: 0.25    Years: 3.00    Total pack years: 0.75    Types: Cigarettes    Quit date: 09/10/1977    Years since quitting: 45.0   Smokeless tobacco: Never  Vaping Use   Vaping Use: Never used  Substance and Sexual Activity    Alcohol use: Yes    Alcohol/week: 1.0 standard drink of alcohol    Types: 1 Standard drinks or equivalent per week    Comment: occasionally a beer, 1-2 at a time   Drug use: No   Sexual activity: Yes    Birth control/protection: Post-menopausal, Surgical  Other Topics Concern   Not on file  Social History Narrative   She has remote history of minimal tobacco use, but has     not smoked in years.  She rarely consumes alcohol.    Desires CPR      01/28/20   From: the area   Living: with husband, [name] [phone])    Work: Therapist, art rep for an [employer]      Family: has 2 adult children - Production assistant, radio and Rob - 2 grandchildren      Enjoys: play golf, spend time with friends      Exercise: walking - not as much as she should   Diet: egg and toast, works through lunch but eats, healthy food at dinner      Safety   Seat belts: Yes    Guns: No   Safe in relationships: Yes    Social Determinants of Radio broadcast assistant Strain: Low  Risk  (01/12/2022)   Overall Financial Resource Strain (CARDIA)    Difficulty of Paying Living Expenses: Not hard at all  Food Insecurity: No Food Insecurity (01/12/2022)   Hunger Vital Sign    Worried About Running Out of Food in the Last Year: Never true    Ran Out of Food in the Last Year: Never true  Transportation Needs: No Transportation Needs (01/12/2022)   PRAPARE - Hydrologist (Medical): No    Lack of Transportation (Non-Medical): No  Physical Activity: Insufficiently Active (01/12/2022)   Exercise Vital Sign    Days of Exercise per Week: 3 days    Minutes of Exercise per Session: 30 min  Stress: No Stress Concern Present (01/12/2022)   [place]    Feeling of Stress : Not at all  Social Connections: Socially Integrated (01/12/2022)   Social Connection and Isolation Panel [NHANES]    Frequency of Communication with Friends and Family: More than  three times a week    Frequency of Social Gatherings with Friends and Family: More than three times a week    Attends Religious Services: More than 4 times per year    Active Member of [employer] or Organizations: Yes    Attends Music therapist: More than 4 times per year    Marital Status: Married  Human resources officer Violence: Not At Risk (01/12/2022)   Humiliation, Afraid, Rape, and Kick questionnaire    Fear of Current or Ex-Partner: No    Emotionally Abused: No    Physically Abused: No    Sexually Abused: No    Family History  Problem Relation Age of Onset   Breast cancer Mother 72   Hyperlipidemia Father    Stroke Father 63    ROS: Knee arthralgias but no fevers or chills, productive cough, hemoptysis, dysphasia, odynophagia, melena, hematochezia, dysuria, hematuria, rash, seizure activity, orthopnea, PND, pedal edema, claudication. Remaining systems are negative.  Physical Exam: Well-developed well-nourished in no acute distress.  Skin is warm and dry.  HEENT is normal.  Neck is supple.  Right carotid bruit Chest is clear to auscultation with normal expansion.  Cardiovascular exam is regular rate and rhythm.  Abdominal exam nontender or distended. No masses palpated. Extremities show no edema. neuro grossly intact  ECG-normal sinus rhythm at a rate of 80, rare PVC.  Personally reviewed  A/P  1 hypertension-blood pressure controlled.  Continue present medications.  Check potassium and renal function.  2 hyperlipidemia-continue statin.  Check lipids and liver.  3 carotid artery disease-continue aspirin and statin.  Followed by vascular surgery.  Kirk Ruths, MD

## 2022-09-26 ENCOUNTER — Ambulatory Visit: Payer: Medicare Other | Attending: Cardiology | Admitting: Cardiology

## 2022-09-26 ENCOUNTER — Encounter: Payer: Self-pay | Admitting: Cardiology

## 2022-09-26 VITALS — BP 140/80 | HR 80 | Ht 69.0 in | Wt 161.2 lb

## 2022-09-26 DIAGNOSIS — I779 Disorder of arteries and arterioles, unspecified: Secondary | ICD-10-CM | POA: Diagnosis not present

## 2022-09-26 DIAGNOSIS — I10 Essential (primary) hypertension: Secondary | ICD-10-CM | POA: Diagnosis not present

## 2022-09-26 DIAGNOSIS — E7800 Pure hypercholesterolemia, unspecified: Secondary | ICD-10-CM | POA: Diagnosis not present

## 2022-09-26 MED ORDER — ROSUVASTATIN CALCIUM 5 MG PO TABS: 5.0000 mg | ORAL_TABLET | ORAL | 3 refills | Status: DC

## 2022-09-26 NOTE — Patient Instructions (Signed)
  Lab Work:  Your physician recommends that you return for lab work FASTING  If you have labs (blood work) drawn today and your tests are completely normal, you will receive your results only by: [place] (if you have [place]) OR A paper copy in the mail If you have any lab test that is abnormal or we need to change your treatment, we will call you to review the results.    Follow-Up: At [facility], you and your health needs are our priority.  As part of our continuing mission to provide you with exceptional heart care, we have created designated Provider Care Teams.  These Care Teams include your primary Cardiologist (physician) and Advanced Practice Providers (APPs -  Physician Assistants and Nurse Practitioners) who all work together to provide you with the care you need, when you need it.  We recommend signing up for the patient portal called "MyChart".  Sign up information is provided on this After Visit Summary.  MyChart is used to connect with patients for Virtual Visits (Telemedicine).  Patients are able to view lab/test results, encounter notes, upcoming appointments, etc.  Non-urgent messages can be sent to your provider as well.   To learn more about what you can do with MyChart, go to [email or web address].    Your next appointment:   12 month(s)  Provider:   Kirk Ruths MD

## 2022-09-27 NOTE — Addendum Note (Signed)
Addended by: Jacqulynn Cadet on: 09/27/2022 09:08 AM   Modules accepted: Orders

## 2022-10-04 ENCOUNTER — Encounter: Payer: Self-pay | Admitting: *Deleted

## 2022-10-04 DIAGNOSIS — M25562 Pain in left knee: Secondary | ICD-10-CM | POA: Diagnosis not present

## 2022-10-04 DIAGNOSIS — G8929 Other chronic pain: Secondary | ICD-10-CM | POA: Diagnosis not present

## 2022-10-04 DIAGNOSIS — M1712 Unilateral primary osteoarthritis, left knee: Secondary | ICD-10-CM | POA: Diagnosis not present

## 2022-10-08 NOTE — Telephone Encounter (Signed)
Patient TOC has been moved to this week.

## 2022-10-09 ENCOUNTER — Ambulatory Visit: Payer: Medicare Other | Admitting: Family Medicine

## 2022-10-10 ENCOUNTER — Ambulatory Visit (INDEPENDENT_AMBULATORY_CARE_PROVIDER_SITE_OTHER): Payer: Medicare Other | Admitting: Family Medicine

## 2022-10-10 ENCOUNTER — Encounter: Payer: Self-pay | Admitting: Family Medicine

## 2022-10-10 VITALS — BP 132/78 | HR 78 | Ht 69.0 in | Wt 159.0 lb

## 2022-10-10 DIAGNOSIS — Z8669 Personal history of other diseases of the nervous system and sense organs: Secondary | ICD-10-CM | POA: Diagnosis not present

## 2022-10-10 DIAGNOSIS — M85859 Other specified disorders of bone density and structure, unspecified thigh: Secondary | ICD-10-CM

## 2022-10-10 DIAGNOSIS — I10 Essential (primary) hypertension: Secondary | ICD-10-CM | POA: Diagnosis not present

## 2022-10-10 DIAGNOSIS — E785 Hyperlipidemia, unspecified: Secondary | ICD-10-CM | POA: Diagnosis not present

## 2022-10-10 DIAGNOSIS — Z01818 Encounter for other preprocedural examination: Secondary | ICD-10-CM | POA: Insufficient documentation

## 2022-10-10 DIAGNOSIS — I779 Disorder of arteries and arterioles, unspecified: Secondary | ICD-10-CM | POA: Diagnosis not present

## 2022-10-10 DIAGNOSIS — M542 Cervicalgia: Secondary | ICD-10-CM

## 2022-10-10 DIAGNOSIS — G8929 Other chronic pain: Secondary | ICD-10-CM

## 2022-10-10 NOTE — Assessment & Plan Note (Signed)
Treated with heat, and tylneol. Followed by Dr. Nelva Bush in past.  Histor yof ESI, no surgeries.

## 2022-10-10 NOTE — Assessment & Plan Note (Signed)
Last check in  2023 .Marland Kitchen LDL almost at goal < 70 given carotid stenosis on crestor 5 mg every other day.

## 2022-10-10 NOTE — Progress Notes (Addendum)
Patient ID: Christine Daniels, female    DOB: 1946/05/07, 76 y.o.   MRN: [id number]  This visit was conducted in person.  BP 132/78   Pulse 78   Ht '5\' 9"'$  (1.753 m)   Wt 159 lb (72.1 kg)   SpO2 98%   BMI 23.48 kg/m    CC:  Chief Complaint  Patient presents with   Establish Care   Pre-op Exam    TKR, Left; EKG 09/26/22    Subjective:   HPI: Christine Daniels is a 77 y.o. female presenting on 10/10/2022 for Establish Care and Pre-op Exam (TKR, Left; EKG 09/26/22)  Previous PCP [facility]  Last CPX: GYN Dr. Stann Mainland GYN exam 06/26/2022 AMW 01/2022 with nurse.   She has upcoming knee replacement and  is here for surgical clearance. Dr. Ronnie Derby, epidural for anesthesia.  She reports she is able to walk up 2 flights of stairs without diffiuclty.    She denies Chest pain, SOB. Reviewed EKG from [facility]  Hx of carotid artery disease,  followed by vascular until last few years... no further needed given unchanged. Reviewed last cardiology OV from 09/26/2022.   Dr. [facility]. No history of stroke.  Hypertension:  Well controlled  On metoprolol 25 mg, losartan 25 mg daily and Maxzide 75-'50mg'$   BP Readings from Last 3 Encounters:  10/10/22 132/78  09/26/22 (!) 140/80  04/17/22 138/70  Using medication without problems or lightheadedness:  none Chest pain with exertion:none Edema:none Short of breath:none Average home BPs: 101-105/68-78 Other issues:  High cholesterol: last check in  2023 .Marland Kitchen LDL almost at goal < 70 given carotid stenosis on crestor 5 mg every other day. Lab Results  Component Value Date   CHOL 158 10/18/2021   HDL 44 10/18/2021   LDLCALC 93 10/18/2021   LDLDIRECT 166.0 05/31/2016   TRIG 117 10/18/2021   CHOLHDL 3.6 10/18/2021   Chronic migraine: per last [facility] note:  She was on zonisamide, but stopped.  Rare migraine in last several years.  Rare occurrence. Referred to neuro 05/2022... never went.   Relevant past medical, surgical, family and  social history reviewed and updated as indicated. Interim medical history since our last visit reviewed. Allergies and medications reviewed and updated. Outpatient Medications Prior to Visit  Medication Sig Dispense Refill   Acetaminophen (TYLENOL) 325 MG CAPS Tylenol     Coenzyme Q10 (CO Q10) 100 MG TABS Take 1 tablet by mouth every other day.     loratadine-pseudoephedrine (CLARITIN-D 12-HOUR) 5-120 MG per tablet Take 1 tablet by mouth daily as needed.      losartan (COZAAR) 25 MG tablet Take 1 tablet (25 mg total) by mouth daily. 90 tablet 3   metoprolol succinate (TOPROL-XL) 25 MG 24 hr tablet TAKE 1 TABLET BY MOUTH EVERY DAY 90 tablet 3   potassium chloride SA (KLOR-CON M20) 20 MEQ tablet TAKE 1 TABLET (20 MEQ TOTAL) BY MOUTH EVERY OTHER DAY. 45 tablet 4   rosuvastatin (CRESTOR) 5 MG tablet Take 1 tablet (5 mg total) by mouth every other day. 45 tablet 3   triamterene-hydrochlorothiazide (MAXZIDE) 75-50 MG per tablet Take 1 tablet by mouth daily. 100 tablet 3   No facility-administered medications prior to visit.     Per HPI unless specifically indicated in ROS section below Review of Systems  Constitutional:  Negative for fatigue and fever.  HENT:  Negative for congestion.   Eyes:  Negative for pain.  Respiratory:  Negative for cough and  shortness of breath.   Cardiovascular:  Negative for chest pain, palpitations and leg swelling.  Gastrointestinal:  Negative for abdominal pain.  Genitourinary:  Negative for dysuria and vaginal bleeding.  Musculoskeletal:  Negative for back pain.  Neurological:  Negative for syncope, light-headedness and headaches.  Psychiatric/Behavioral:  Negative for dysphoric mood.    Objective:  BP 132/78   Pulse 78   Ht '5\' 9"'$  (1.753 m)   Wt 159 lb (72.1 kg)   SpO2 98%   BMI 23.48 kg/m   Wt Readings from Last 3 Encounters:  10/10/22 159 lb (72.1 kg)  09/26/22 161 lb 3.2 oz (73.1 kg)  04/17/22 158 lb (71.7 kg)      Physical Exam Constitutional:       General: She is not in acute distress.    Appearance: Normal appearance. She is well-developed. She is not ill-appearing or toxic-appearing.  HENT:     Head: Normocephalic.     Right Ear: Hearing, tympanic membrane, ear canal and external ear normal. Tympanic membrane is not erythematous, retracted or bulging.     Left Ear: Hearing, tympanic membrane, ear canal and external ear normal. Tympanic membrane is not erythematous, retracted or bulging.     Nose: No mucosal edema or rhinorrhea.     Right Sinus: No maxillary sinus tenderness or frontal sinus tenderness.     Left Sinus: No maxillary sinus tenderness or frontal sinus tenderness.     Mouth/Throat:     Pharynx: Uvula midline.  Eyes:     General: Lids are normal. Lids are everted, no foreign bodies appreciated.     Conjunctiva/sclera: Conjunctivae normal.     Pupils: Pupils are equal, round, and reactive to light.  Neck:     Thyroid: No thyroid mass or thyromegaly.     Vascular: No carotid bruit.     Trachea: Trachea normal.  Cardiovascular:     Rate and Rhythm: Normal rate and regular rhythm.     Pulses: Normal pulses.     Heart sounds: Normal heart sounds, S1 normal and S2 normal. No murmur heard.    No friction rub. No gallop.  Pulmonary:     Effort: Pulmonary effort is normal. No tachypnea or respiratory distress.     Breath sounds: Normal breath sounds. No decreased breath sounds, wheezing, rhonchi or rales.  Abdominal:     General: Bowel sounds are normal.     Palpations: Abdomen is soft.     Tenderness: There is no abdominal tenderness.  Musculoskeletal:     Cervical back: Normal range of motion and neck supple.  Skin:    General: Skin is warm and dry.     Findings: No rash.  Neurological:     Mental Status: She is alert.  Psychiatric:        Mood and Affect: Mood is not anxious or depressed.        Speech: Speech normal.        Behavior: Behavior normal. Behavior is cooperative.        Thought Content:  Thought content normal.        Judgment: Judgment normal.       Results for orders placed or performed in visit on 95/18/84  Basic Metabolic Panel (BMET)  Result Value Ref Range   Glucose 94 70 - 99 mg/dL   BUN 24 8 - 27 mg/dL   Creatinine, Ser 1.21 (H) 0.57 - 1.00 mg/dL   eGFR 47 (L) >59 mL/min/1.73   BUN/Creatinine Ratio 20  12 - 28   Sodium 138 134 - 144 mmol/L   Potassium 4.4 3.5 - 5.2 mmol/L   Chloride 97 96 - 106 mmol/L   CO2 28 20 - 29 mmol/L   Calcium 9.5 8.7 - 10.3 mg/dL    Assessment and Plan  Preop examination Assessment & Plan: Low to moderate risk surgery, epidural anesthesia. Able  to perform > 7 mets of exercise.  No cardiopulmonary issues. No past surgical complications.  Open airway. Low risk individual.  Patient optimized for upcoming total knee replacement surgery.  Paperwork faxed to  Dr. Ruel Favors office.    Carotid artery disease, unspecified laterality ([place]) Assessment & Plan:  0-39% bilateral on dopplers in 2021. On aspirin and statin.  Checking every 3 years... per Dr. Scot Dock   Essential hypertension Assessment & Plan: Stable, chronic.  Continue current medication.  Continue metoprolol 25 mg, Maxzide 75-'50mg'$  , losartan 25 mg daily   Hyperlipidemia, unspecified hyperlipidemia type Assessment & Plan: Last check in  2023 .Marland Kitchen LDL almost at goal < 70 given carotid stenosis on crestor 5 mg every other day.   History of migraine headaches Assessment & Plan: She was on zonisamide, but stopped.  Rare migraine in last several years.  Rare occurrence. Referred to neuro 05/2022... never went.   Chronic neck pain Assessment & Plan: Treated with heat, and tylneol. Followed by Dr. Nelva Bush in past.  Histor yof ESI, no surgeries.   Osteopenia of neck of femur, unspecified laterality Assessment & Plan:  Plan recheck at AMW/CPX.     Return in about 3 months (around 01/13/2023) for phone AMW,   then 90mn CPE with me, no labs needed.   AEliezer Lofts MD

## 2022-10-10 NOTE — Assessment & Plan Note (Addendum)
Stable, chronic.  Continue current medication.  Continue metoprolol 25 mg, Maxzide 75-'50mg'$  , losartan 25 mg daily

## 2022-10-10 NOTE — Assessment & Plan Note (Signed)
Plan recheck at AMW/CPX.

## 2022-10-10 NOTE — Assessment & Plan Note (Signed)
She was on zonisamide, but stopped.  Rare migraine in last several years.  Rare occurrence. Referred to neuro 05/2022... never went.

## 2022-10-10 NOTE — Assessment & Plan Note (Addendum)
Low to moderate risk surgery, epidural anesthesia. Able  to perform > 7 mets of exercise.  No cardiopulmonary issues. No past surgical complications.  Open airway. Low risk individual.  Patient optimized for upcoming total knee replacement surgery.  Paperwork faxed to  Dr. Ruel Favors office.

## 2022-10-10 NOTE — Assessment & Plan Note (Addendum)
0-39% bilateral on dopplers in 2021. On aspirin and statin.  Checking every 3 years... per Dr. Scot Dock

## 2022-10-12 ENCOUNTER — Encounter: Payer: Self-pay | Admitting: Family Medicine

## 2022-10-26 DIAGNOSIS — I10 Essential (primary) hypertension: Secondary | ICD-10-CM | POA: Diagnosis not present

## 2022-10-26 DIAGNOSIS — E7800 Pure hypercholesterolemia, unspecified: Secondary | ICD-10-CM | POA: Diagnosis not present

## 2022-10-27 LAB — COMPREHENSIVE METABOLIC PANEL
ALT: 14 IU/L (ref 0–32)
AST: 20 IU/L (ref 0–40)
Albumin/Globulin Ratio: 2.3 — ABNORMAL HIGH (ref 1.2–2.2)
Albumin: 4.4 g/dL (ref 3.8–4.8)
Alkaline Phosphatase: 107 IU/L (ref 44–121)
BUN/Creatinine Ratio: 18 (ref 12–28)
BUN: 22 mg/dL (ref 8–27)
Bilirubin Total: 0.7 mg/dL (ref 0.0–1.2)
CO2: 28 mmol/L (ref 20–29)
Calcium: 9.8 mg/dL (ref 8.7–10.3)
Chloride: 100 mmol/L (ref 96–106)
Creatinine, Ser: 1.22 mg/dL — ABNORMAL HIGH (ref 0.57–1.00)
Globulin, Total: 1.9 g/dL (ref 1.5–4.5)
Glucose: 96 mg/dL (ref 70–99)
Potassium: 4.2 mmol/L (ref 3.5–5.2)
Sodium: 141 mmol/L (ref 134–144)
Total Protein: 6.3 g/dL (ref 6.0–8.5)
eGFR: 46 mL/min/{1.73_m2} — ABNORMAL LOW (ref >59)

## 2022-10-27 LAB — LIPID PANEL
Chol/HDL Ratio: 3.2 ratio (ref 0.0–4.4)
Cholesterol, Total: 121 mg/dL (ref 100–199)
HDL: 38 mg/dL — ABNORMAL LOW (ref >39)
LDL Chol Calc (NIH): 61 mg/dL (ref 0–99)
Triglycerides: 122 mg/dL (ref 0–149)
VLDL Cholesterol Cal: 22 mg/dL (ref 5–40)

## 2022-11-01 DIAGNOSIS — Z419 Encounter for procedure for purposes other than remedying health state, unspecified: Secondary | ICD-10-CM | POA: Diagnosis not present

## 2022-11-09 HISTORY — PX: REPLACEMENT TOTAL KNEE: SUR1224

## 2022-11-14 ENCOUNTER — Encounter: Payer: Self-pay | Admitting: Cardiology

## 2022-11-14 DIAGNOSIS — I10 Essential (primary) hypertension: Secondary | ICD-10-CM

## 2022-11-21 DIAGNOSIS — G8929 Other chronic pain: Secondary | ICD-10-CM | POA: Diagnosis not present

## 2022-11-21 DIAGNOSIS — M25561 Pain in right knee: Secondary | ICD-10-CM | POA: Diagnosis not present

## 2022-11-21 DIAGNOSIS — M25562 Pain in left knee: Secondary | ICD-10-CM | POA: Diagnosis not present

## 2022-11-26 DIAGNOSIS — M1712 Unilateral primary osteoarthritis, left knee: Secondary | ICD-10-CM | POA: Diagnosis not present

## 2022-11-26 DIAGNOSIS — G8918 Other acute postprocedural pain: Secondary | ICD-10-CM | POA: Diagnosis not present

## 2022-11-26 DIAGNOSIS — Z96652 Presence of left artificial knee joint: Secondary | ICD-10-CM | POA: Diagnosis not present

## 2022-12-06 DIAGNOSIS — Z789 Other specified health status: Secondary | ICD-10-CM | POA: Diagnosis not present

## 2022-12-06 DIAGNOSIS — M25462 Effusion, left knee: Secondary | ICD-10-CM | POA: Diagnosis not present

## 2022-12-06 DIAGNOSIS — M25562 Pain in left knee: Secondary | ICD-10-CM | POA: Diagnosis not present

## 2022-12-06 DIAGNOSIS — Z96652 Presence of left artificial knee joint: Secondary | ICD-10-CM | POA: Diagnosis not present

## 2022-12-06 DIAGNOSIS — Z7409 Other reduced mobility: Secondary | ICD-10-CM | POA: Diagnosis not present

## 2022-12-06 DIAGNOSIS — G8929 Other chronic pain: Secondary | ICD-10-CM | POA: Diagnosis not present

## 2022-12-06 DIAGNOSIS — R29898 Other symptoms and signs involving the musculoskeletal system: Secondary | ICD-10-CM | POA: Diagnosis not present

## 2022-12-06 DIAGNOSIS — M25662 Stiffness of left knee, not elsewhere classified: Secondary | ICD-10-CM | POA: Diagnosis not present

## 2022-12-11 DIAGNOSIS — Z789 Other specified health status: Secondary | ICD-10-CM | POA: Diagnosis not present

## 2022-12-11 DIAGNOSIS — R29898 Other symptoms and signs involving the musculoskeletal system: Secondary | ICD-10-CM | POA: Diagnosis not present

## 2022-12-11 DIAGNOSIS — M25562 Pain in left knee: Secondary | ICD-10-CM | POA: Diagnosis not present

## 2022-12-11 DIAGNOSIS — Z7409 Other reduced mobility: Secondary | ICD-10-CM | POA: Diagnosis not present

## 2022-12-11 DIAGNOSIS — M25662 Stiffness of left knee, not elsewhere classified: Secondary | ICD-10-CM | POA: Diagnosis not present

## 2022-12-11 DIAGNOSIS — M25462 Effusion, left knee: Secondary | ICD-10-CM | POA: Diagnosis not present

## 2022-12-21 DIAGNOSIS — Z7409 Other reduced mobility: Secondary | ICD-10-CM | POA: Diagnosis not present

## 2022-12-21 DIAGNOSIS — M25662 Stiffness of left knee, not elsewhere classified: Secondary | ICD-10-CM | POA: Diagnosis not present

## 2022-12-21 DIAGNOSIS — M25562 Pain in left knee: Secondary | ICD-10-CM | POA: Diagnosis not present

## 2022-12-21 DIAGNOSIS — M25462 Effusion, left knee: Secondary | ICD-10-CM | POA: Diagnosis not present

## 2022-12-21 DIAGNOSIS — R29898 Other symptoms and signs involving the musculoskeletal system: Secondary | ICD-10-CM | POA: Diagnosis not present

## 2022-12-21 DIAGNOSIS — Z789 Other specified health status: Secondary | ICD-10-CM | POA: Diagnosis not present

## 2023-01-01 ENCOUNTER — Telehealth: Payer: Self-pay | Admitting: Family Medicine

## 2023-01-01 DIAGNOSIS — Z7409 Other reduced mobility: Secondary | ICD-10-CM | POA: Diagnosis not present

## 2023-01-01 DIAGNOSIS — M25562 Pain in left knee: Secondary | ICD-10-CM | POA: Diagnosis not present

## 2023-01-01 DIAGNOSIS — Z789 Other specified health status: Secondary | ICD-10-CM | POA: Diagnosis not present

## 2023-01-01 DIAGNOSIS — M25462 Effusion, left knee: Secondary | ICD-10-CM | POA: Diagnosis not present

## 2023-01-01 DIAGNOSIS — M25662 Stiffness of left knee, not elsewhere classified: Secondary | ICD-10-CM | POA: Diagnosis not present

## 2023-01-01 DIAGNOSIS — R29898 Other symptoms and signs involving the musculoskeletal system: Secondary | ICD-10-CM | POA: Diagnosis not present

## 2023-01-01 NOTE — Telephone Encounter (Signed)
Contacted Christine Daniels to schedule their annual wellness visit. Appointment made for 01/16/2023. Lvm informing patient that I have to reschedule her appt from 01/15/2023 to 01/16/2023 due to schedule changes.   [facility] Care Guide [facility] TEAM Direct Dial: [phone]

## 2023-01-04 DIAGNOSIS — R29898 Other symptoms and signs involving the musculoskeletal system: Secondary | ICD-10-CM | POA: Diagnosis not present

## 2023-01-04 DIAGNOSIS — M25462 Effusion, left knee: Secondary | ICD-10-CM | POA: Diagnosis not present

## 2023-01-04 DIAGNOSIS — Z7409 Other reduced mobility: Secondary | ICD-10-CM | POA: Diagnosis not present

## 2023-01-04 DIAGNOSIS — M25562 Pain in left knee: Secondary | ICD-10-CM | POA: Diagnosis not present

## 2023-01-04 DIAGNOSIS — M25662 Stiffness of left knee, not elsewhere classified: Secondary | ICD-10-CM | POA: Diagnosis not present

## 2023-01-08 DIAGNOSIS — M25562 Pain in left knee: Secondary | ICD-10-CM | POA: Diagnosis not present

## 2023-01-08 DIAGNOSIS — Z789 Other specified health status: Secondary | ICD-10-CM | POA: Diagnosis not present

## 2023-01-08 DIAGNOSIS — M25462 Effusion, left knee: Secondary | ICD-10-CM | POA: Diagnosis not present

## 2023-01-08 DIAGNOSIS — R29898 Other symptoms and signs involving the musculoskeletal system: Secondary | ICD-10-CM | POA: Diagnosis not present

## 2023-01-08 DIAGNOSIS — Z7409 Other reduced mobility: Secondary | ICD-10-CM | POA: Diagnosis not present

## 2023-01-08 DIAGNOSIS — M25662 Stiffness of left knee, not elsewhere classified: Secondary | ICD-10-CM | POA: Diagnosis not present

## 2023-01-10 DIAGNOSIS — Z789 Other specified health status: Secondary | ICD-10-CM | POA: Diagnosis not present

## 2023-01-10 DIAGNOSIS — M25662 Stiffness of left knee, not elsewhere classified: Secondary | ICD-10-CM | POA: Diagnosis not present

## 2023-01-10 DIAGNOSIS — S63621A Sprain of interphalangeal joint of right thumb, initial encounter: Secondary | ICD-10-CM | POA: Diagnosis not present

## 2023-01-10 DIAGNOSIS — R29898 Other symptoms and signs involving the musculoskeletal system: Secondary | ICD-10-CM | POA: Diagnosis not present

## 2023-01-10 DIAGNOSIS — M79641 Pain in right hand: Secondary | ICD-10-CM | POA: Diagnosis not present

## 2023-01-10 DIAGNOSIS — S63629A Sprain of interphalangeal joint of unspecified thumb, initial encounter: Secondary | ICD-10-CM | POA: Diagnosis not present

## 2023-01-10 DIAGNOSIS — M25462 Effusion, left knee: Secondary | ICD-10-CM | POA: Diagnosis not present

## 2023-01-10 DIAGNOSIS — M25562 Pain in left knee: Secondary | ICD-10-CM | POA: Diagnosis not present

## 2023-01-10 DIAGNOSIS — Z7409 Other reduced mobility: Secondary | ICD-10-CM | POA: Diagnosis not present

## 2023-01-14 ENCOUNTER — Ambulatory Visit (INDEPENDENT_AMBULATORY_CARE_PROVIDER_SITE_OTHER): Payer: Medicare Other | Admitting: Family Medicine

## 2023-01-14 ENCOUNTER — Encounter: Payer: Self-pay | Admitting: Family Medicine

## 2023-01-14 VITALS — BP 124/74 | HR 94 | Temp 97.6°F | Ht 69.0 in | Wt 147.1 lb

## 2023-01-14 DIAGNOSIS — J069 Acute upper respiratory infection, unspecified: Secondary | ICD-10-CM

## 2023-01-14 DIAGNOSIS — J029 Acute pharyngitis, unspecified: Secondary | ICD-10-CM | POA: Diagnosis not present

## 2023-01-14 LAB — POC COVID19 BINAXNOW: SARS Coronavirus 2 Ag: NEGATIVE

## 2023-01-14 NOTE — Progress Notes (Signed)
Christine Goto T. Rees Santistevan, MD, CAQ Sports Medicine [facility] [address] [place], [address]  Phone: [phone]  FAX: [phone]  Christine Daniels - 77 y.o. female  MRN [id number]  Date of Birth: May 22, 1946  Date: 01/14/2023  PCP: Excell Seltzer, MD  Referral: Excell Seltzer, MD  Chief Complaint  Patient presents with   Sore Throat    Cough, symptoms started a week ago, no fever   Subjective:   Christine Daniels is a 77 y.o. very pleasant female patient with Body mass index is 21.73 kg/m. who presents with the following:  Patient presents with ongoing sore throat and question of strep throat on secondary questioning her sore throat is only mild and she has some cough that is minimally productive and some postnasal drip..  I have seen her before roughly 9 years ago for a viral syndrome.  Feels congested and a sore throat.  Headache.  Husband did not have Covid or anything.  Has been sick for a week. No fever - if anything is a little low.   Some sinus pressure and pain.  Sore around her eyes.  Clear sputum on cough.   Nonsmoker.   Covid negative  Review of Systems is noted in the HPI, as appropriate  Objective:   BP 124/74 (BP Location: Left Arm, Patient Position: Sitting)   Pulse 94   Temp 97.6 F (36.4 C) (Skin)   Ht 5\' 9"  (1.753 m)   Wt 147 lb 2 oz (66.7 kg)   SpO2 96%   BMI 21.73 kg/m    Gen: WDWN, NAD. Globally Non-toxic HEENT: Throat clear, w/o exudate, R TM clear, L TM - good landmarks, No fluid present. rhinnorhea.  MMM Frontal sinuses: NT Max sinuses: NT NECK: Anterior cervical  LAD is absent CV: RRR, No M/G/R, cap refill <2 sec PULM: Breathing comfortably in no respiratory distress. no wheezing, crackles, rhonchi   Laboratory and Imaging Data: Results for orders placed or performed in visit on 01/14/23  POC COVID-19 BinaxNow  Result Value Ref Range   SARS Coronavirus 2 Ag Negative Negative      Assessment and Plan:     ICD-10-CM   1. Viral URI  J06.9     2. Sore throat  J02.9 POC COVID-19 BinaxNow     Supportive care, consistent with virus.  Follow up as needed   Orders placed today for conditions managed today: Orders Placed This Encounter  Procedures   POC COVID-19 BinaxNow   Dragon [employer] One speech-to-text software was used for transcription in this dictation.  Possible transcriptional errors can occur using Animal nutritionist.   Signed,  [name]. Sheana Bir, MD   Outpatient Encounter Medications as of 01/14/2023  Medication Sig   Acetaminophen (TYLENOL) 325 MG CAPS Tylenol   Coenzyme Q10 (CO Q10) 100 MG TABS Take 1 tablet by mouth every other day.   dextromethorphan-guaiFENesin (MUCINEX DM) 30-600 MG 12hr tablet Take 1 tablet by mouth 2 (two) times daily.   loratadine-pseudoephedrine (CLARITIN-D 12-HOUR) 5-120 MG per tablet Take 1 tablet by mouth daily as needed.    losartan (COZAAR) 25 MG tablet Take 1 tablet (25 mg total) by mouth daily.   metoprolol succinate (TOPROL-XL) 25 MG 24 hr tablet TAKE 1 TABLET BY MOUTH EVERY DAY   potassium chloride SA (KLOR-CON M20) 20 MEQ tablet TAKE 1 TABLET (20 MEQ TOTAL) BY MOUTH EVERY OTHER DAY.   rosuvastatin (CRESTOR) 5 MG tablet Take 1 tablet (  5 mg total) by mouth every other day.   triamterene-hydrochlorothiazide (MAXZIDE) 75-50 MG per tablet Take 1 tablet by mouth daily.   No facility-administered encounter medications on file as of 01/14/2023.

## 2023-01-16 ENCOUNTER — Ambulatory Visit (INDEPENDENT_AMBULATORY_CARE_PROVIDER_SITE_OTHER): Payer: Medicare Other

## 2023-01-16 VITALS — Ht 68.5 in | Wt 147.0 lb

## 2023-01-16 DIAGNOSIS — Z0000 Encounter for general adult medical examination without abnormal findings: Secondary | ICD-10-CM | POA: Diagnosis not present

## 2023-01-16 NOTE — Patient Instructions (Signed)
Christine Daniels , Thank you for taking time to come for your Medicare Wellness Visit. I appreciate your ongoing commitment to your health goals. Please review the following plan we discussed and let me know if I can assist you in the future.   These are the goals we discussed:  Goals       Maintain healthy active lifestyle. (pt-stated)      Maintain good health      Patient Stated      No new goals        This is a list of the screening recommended for you and due dates:  Health Maintenance  Topic Date Due   DEXA scan (bone density measurement)  09/25/2021   COVID-19 Vaccine (5 - 2023-24 season) 10/11/2023*   Zoster (Shingles) Vaccine (1 of 2) 10/11/2023*   Flu Shot  04/11/2023   Mammogram  06/27/2023   Medicare Annual Wellness Visit  01/16/2024   Pneumonia Vaccine  Completed   Hepatitis C Screening: USPSTF Recommendation to screen - Ages 18-79 yo.  Completed   HPV Vaccine  Aged Out   DTaP/Tdap/Td vaccine  Discontinued   Colon Cancer Screening  Discontinued   Cologuard (Stool DNA test)  Discontinued  *Topic was postponed. The date shown is not the original due date.    Advanced directives: in chart.  Conditions/risks identified: none  Next appointment: Follow up in one year for your annual wellness visit 01/20/24 @ 9:00 telephone.   Preventive Care 63 Years and Older, Female Preventive care refers to lifestyle choices and visits with your health care provider that can promote health and wellness. What does preventive care include? A yearly physical exam. This is also called an annual well check. Dental exams once or twice a year. Routine eye exams. Ask your health care provider how often you should have your eyes checked. Personal lifestyle choices, including: Daily care of your teeth and gums. Regular physical activity. Eating a healthy diet. Avoiding tobacco and drug use. Limiting alcohol use. Practicing safe sex. Taking low-dose aspirin every day. Taking vitamin and  mineral supplements as recommended by your health care provider. What happens during an annual well check? The services and screenings done by your health care provider during your annual well check will depend on your age, overall health, lifestyle risk factors, and family history of disease. Counseling  Your health care provider may ask you questions about your: Alcohol use. Tobacco use. Drug use. Emotional well-being. Home and relationship well-being. Sexual activity. Eating habits. History of falls. Memory and ability to understand (cognition). Work and work Astronomer. Reproductive health. Screening  You may have the following tests or measurements: Height, weight, and BMI. Blood pressure. Lipid and cholesterol levels. These may be checked every 5 years, or more frequently if you are over 45 years old. Skin check. Lung cancer screening. You may have this screening every year starting at age 92 if you have a 30-pack-year history of smoking and currently smoke or have quit within the past 15 years. Fecal occult blood test (FOBT) of the stool. You may have this test every year starting at age 78. Flexible sigmoidoscopy or colonoscopy. You may have a sigmoidoscopy every 5 years or a colonoscopy every 10 years starting at age 70. Hepatitis C blood test. Hepatitis B blood test. Sexually transmitted disease (STD) testing. Diabetes screening. This is done by checking your blood sugar (glucose) after you have not eaten for a while (fasting). You may have this done every 1-3 years. Bone  density scan. This is done to screen for osteoporosis. You may have this done starting at age 41. Mammogram. This may be done every 1-2 years. Talk to your health care provider about how often you should have regular mammograms. Talk with your health care provider about your test results, treatment options, and if necessary, the need for more tests. Vaccines  Your health care provider may recommend  certain vaccines, such as: Influenza vaccine. This is recommended every year. Tetanus, diphtheria, and acellular pertussis (Tdap, Td) vaccine. You may need a Td booster every 10 years. Zoster vaccine. You may need this after age 41. Pneumococcal 13-valent conjugate (PCV13) vaccine. One dose is recommended after age 63. Pneumococcal polysaccharide (PPSV23) vaccine. One dose is recommended after age 3. Talk to your health care provider about which screenings and vaccines you need and how often you need them. This information is not intended to replace advice given to you by your health care provider. Make sure you discuss any questions you have with your health care provider. Document Released: 09/23/2015 Document Revised: 05/16/2016 Document Reviewed: 06/28/2015 Elsevier Interactive Patient Education  2017 [place] Prevention in the Home Falls can cause injuries. They can happen to people of all ages. There are many things you can do to make your home safe and to help prevent falls. What can I do on the outside of my home? Regularly fix the edges of walkways and driveways and fix any cracks. Remove anything that might make you trip as you walk through a door, such as a raised step or threshold. Trim any bushes or trees on the path to your home. Use bright outdoor lighting. Clear any walking paths of anything that might make someone trip, such as rocks or tools. Regularly check to see if handrails are loose or broken. Make sure that both sides of any steps have handrails. Any raised decks and porches should have guardrails on the edges. Have any leaves, snow, or ice cleared regularly. Use sand or salt on walking paths during winter. Clean up any spills in your garage right away. This includes oil or grease spills. What can I do in the bathroom? Use night lights. Install grab bars by the toilet and in the tub and shower. Do not use towel bars as grab bars. Use non-skid mats or  decals in the tub or shower. If you need to sit down in the shower, use a plastic, non-slip stool. Keep the floor dry. Clean up any water that spills on the floor as soon as it happens. Remove soap buildup in the tub or shower regularly. Attach bath mats securely with double-sided non-slip rug tape. Do not have throw rugs and other things on the floor that can make you trip. What can I do in the bedroom? Use night lights. Make sure that you have a light by your bed that is easy to reach. Do not use any sheets or blankets that are too big for your bed. They should not hang down onto the floor. Have a firm chair that has side arms. You can use this for support while you get dressed. Do not have throw rugs and other things on the floor that can make you trip. What can I do in the kitchen? Clean up any spills right away. Avoid walking on wet floors. Keep items that you use a lot in easy-to-reach places. If you need to reach something above you, use a strong step stool that has a grab bar. Keep  electrical cords out of the way. Do not use floor polish or wax that makes floors slippery. If you must use wax, use non-skid floor wax. Do not have throw rugs and other things on the floor that can make you trip. What can I do with my stairs? Do not leave any items on the stairs. Make sure that there are handrails on both sides of the stairs and use them. Fix handrails that are broken or loose. Make sure that handrails are as long as the stairways. Check any carpeting to make sure that it is firmly attached to the stairs. Fix any carpet that is loose or worn. Avoid having throw rugs at the top or bottom of the stairs. If you do have throw rugs, attach them to the floor with carpet tape. Make sure that you have a light switch at the top of the stairs and the bottom of the stairs. If you do not have them, ask someone to add them for you. What else can I do to help prevent falls? Wear shoes that: Do not  have high heels. Have rubber bottoms. Are comfortable and fit you well. Are closed at the toe. Do not wear sandals. If you use a stepladder: Make sure that it is fully opened. Do not climb a closed stepladder. Make sure that both sides of the stepladder are locked into place. Ask someone to hold it for you, if possible. Clearly mark and make sure that you can see: Any grab bars or handrails. First and last steps. Where the edge of each step is. Use tools that help you move around (mobility aids) if they are needed. These include: Canes. Walkers. Scooters. Crutches. Turn on the lights when you go into a dark area. Replace any light bulbs as soon as they burn out. Set up your furniture so you have a clear path. Avoid moving your furniture around. If any of your floors are uneven, fix them. If there are any pets around you, be aware of where they are. Review your medicines with your doctor. Some medicines can make you feel dizzy. This can increase your chance of falling. Ask your doctor what other things that you can do to help prevent falls. This information is not intended to replace advice given to you by your health care provider. Make sure you discuss any questions you have with your health care provider. Document Released: 06/23/2009 Document Revised: 02/02/2016 Document Reviewed: 10/01/2014 Elsevier Interactive Patient Education  2017 [employer].

## 2023-01-16 NOTE — Progress Notes (Signed)
I connected with  DIANNAH LOSI on 01/16/23 by a audio enabled telemedicine application and verified that I am speaking with the correct person using two identifiers.  Patient Location: Home  Provider Location: Home Office  I discussed the limitations of evaluation and management by telemedicine. The patient expressed understanding and agreed to proceed.  Subjective:   Annelyse Waheed Downs is a 77 y.o. female who presents for Medicare Annual (Subsequent) preventive examination.  Review of Systems      Cardiac Risk Factors include: advanced age (>27men, >48 women);hypertension     Objective:    Today's Vitals   01/16/23 0850  Weight: 147 lb (66.7 kg)  Height: 5' 8.5" (1.74 m)  PainSc: 2    Body mass index is 22.03 kg/m.     01/16/2023    9:03 AM 01/12/2022    8:30 AM 08/22/2020    8:42 AM 04/16/2018    9:11 AM 08/24/2015    9:19 AM 08/25/2014    9:51 AM  Advanced Directives  Does Patient Have a Medical Advance Directive? Yes Yes Yes Yes Yes Yes  Type of Estate agent of [place];Living will Healthcare Power of [place];Living will Healthcare Power of [employer] of [place];Living will Healthcare Power of [place];Living will Living will;Healthcare Power of Attorney  Does patient want to make changes to medical advance directive? No - Patient declined No - Patient declined No - Patient declined   No - Patient declined  Copy of Healthcare Power of Attorney in Chart? Yes - validated most recent copy scanned in chart (See row information) No - copy requested No - copy requested No - copy requested  No - copy requested    Current Medications (verified) Outpatient Encounter Medications as of 01/16/2023  Medication Sig   Acetaminophen (TYLENOL) 325 MG CAPS Tylenol   Coenzyme Q10 (CO Q10) 100 MG TABS Take 1 tablet by mouth every other day.   dextromethorphan-guaiFENesin (MUCINEX DM) 30-600 MG 12hr tablet Take 1 tablet by mouth 2 (two) times daily.    losartan (COZAAR) 25 MG tablet Take 1 tablet (25 mg total) by mouth daily.   metoprolol succinate (TOPROL-XL) 25 MG 24 hr tablet TAKE 1 TABLET BY MOUTH EVERY DAY   potassium chloride SA (KLOR-CON M20) 20 MEQ tablet TAKE 1 TABLET (20 MEQ TOTAL) BY MOUTH EVERY OTHER DAY.   rosuvastatin (CRESTOR) 5 MG tablet Take 1 tablet (5 mg total) by mouth every other day.   triamterene-hydrochlorothiazide (MAXZIDE) 75-50 MG per tablet Take 1 tablet by mouth daily.   loratadine-pseudoephedrine (CLARITIN-D 12-HOUR) 5-120 MG per tablet Take 1 tablet by mouth daily as needed.  (Patient not taking: Reported on 01/16/2023)   No facility-administered encounter medications on file as of 01/16/2023.    Allergies (verified) Patient has no known allergies.   History: Past Medical History:  Diagnosis Date   Arthritis    Carotid artery occlusion    CEREBROVASCULAR DISEASE    HYPERLIPIDEMIA    Hypertension    Migraine    Past Surgical History:  Procedure Laterality Date   ABDOMINAL HYSTERECTOMY  09/10/2008   BREAST BIOPSY  09/10/1985   REPLACEMENT TOTAL KNEE Left 11/2022   Status post bilateral tubal ligation.     Family History  Problem Relation Age of Onset   Breast cancer Mother 66   Hyperlipidemia Father    Stroke Father 4   Heart disease Brother    Cancer Son        glioma   Social  History   Socioeconomic History   Marital status: Married    Spouse name: Jonne Ply    Number of children: 2   Years of education: College   Highest education level: Not on file  Occupational History   Not on file  Tobacco Use   Smoking status: Former    Packs/day: 0.25    Years: 3.00    Additional pack years: 0.00    Total pack years: 0.75    Types: Cigarettes    Quit date: 09/10/1977    Years since quitting: 45.3   Smokeless tobacco: Never  Vaping Use   Vaping Use: Never used  Substance and Sexual Activity   Alcohol use: Yes    Alcohol/week: 1.0 standard drink of alcohol    Types: 1 Standard drinks or  equivalent per week    Comment: occasionally a beer, 1-2 at a time   Drug use: No   Sexual activity: Yes    Birth control/protection: Post-menopausal, Surgical  Other Topics Concern   Not on file  Social History Narrative   She has remote history of minimal tobacco use, but has     not smoked in years.  She rarely consumes alcohol.    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[employer]      Family: has 2 adult children - Museum/gallery exhibitions officer and Rob - 2 grandchildren      Enjoys: play golf, spend time with friends      Exercise: walking - not as much as she should   Diet: egg and toast, works through lunch but eats, healthy food at dinner      Safety   Seat belts: Yes    Guns: No   Safe in relationships: Yes    Social Determinants of Corporate investment banker Strain: Low Risk  (01/16/2023)   Overall Financial Resource Strain (CARDIA)    Difficulty of Paying Living Expenses: Not hard at all  Food Insecurity: No Food Insecurity (01/16/2023)   Hunger Vital Sign    Worried About Running Out of Food in the Last Year: Never true    Ran Out of Food in the Last Year: Never true  Transportation Needs: No Transportation Needs (01/16/2023)   PRAPARE - Administrator, Civil Service (Medical): No    Lack of Transportation (Non-Medical): No  Physical Activity: Sufficiently Active (01/16/2023)   Exercise Vital Sign    Days of Exercise per Week: 6 days    Minutes of Exercise per Session: 60 min  Stress: No Stress Concern Present (01/16/2023)   [employer] of Occupational Health - Occupational Stress Questionnaire    Feeling of Stress : Not at all  Social Connections: Moderately Integrated (01/16/2023)   Social Connection and Isolation Panel [NHANES]    Frequency of Communication with Friends and Family: More than three times a week    Frequency of Social Gatherings with Friends and Family: More than three  times a week    Attends Religious Services: More than 4 times per year    Active Member of [employer] or Organizations: No    Attends Banker Meetings: Never    Marital Status: Married    Tobacco Counseling Counseling given: Not Answered   Clinical Intake:  Pre-visit preparation completed: Yes  Pain : 0-10 Pain Score: 2  Pain Type: Acute pain Pain Location: Leg  Pain Orientation: Left (S/P knee replacement followed by orthopedic) Pain Descriptors / Indicators: Aching Pain Frequency: Intermittent     Nutritional Risks: None Diabetes: No  How often do you need to have someone help you when you read instructions, pamphlets, or other written materials from your doctor or pharmacy?: 1 - Never  Diabetic? no  Interpreter Needed?: No  Information entered by :: C.Cythnia Osmun LPN   Activities of Daily Living    01/16/2023    9:04 AM  In your present state of health, do you have any difficulty performing the following activities:  Hearing? 0  Vision? 0  Difficulty concentrating or making decisions? 0  Walking or climbing stairs? 0  Dressing or bathing? 0  Doing errands, shopping? 0  Preparing Food and eating ? N  Using the Toilet? N  In the past six months, have you accidently leaked urine? N  Do you have problems with loss of bowel control? N  Managing your Medications? N  Managing your Finances? N  Housekeeping or managing your Housekeeping? N    Patient Care Team: Excell Seltzer, MD as PCP - General (Family Medicine) Jens Som Madolyn Frieze, MD as PCP - Cardiology (Cardiology) Sheran Luz, MD (Physical Medicine and Rehabilitation) Jens Som Madolyn Frieze, MD as Consulting Physician (Cardiology) Chuck Hint, MD as Consulting Physician (Vascular Surgery) Annamaria Helling, MD as Consulting Physician (Obstetrics and Gynecology) Elesa Hacker, MD as Referring Physician (Dermatology)  Indicate any recent Medical Services you may have received from other  than [facility] providers in the past year (date may be approximate).     Assessment:   This is a routine wellness examination for Adin.  Hearing/Vision screen Hearing Screening - Comments:: No aids Vision Screening - Comments:: Glasses - [facility]  Dietary issues and exercise activities discussed: Current Exercise Habits: Home exercise routine, Type of exercise: strength training/weights;stretching, Time (Minutes): 60, Frequency (Times/Week): 6, Weekly Exercise (Minutes/Week): 360, Intensity: Mild, Exercise limited by: orthopedic condition(s) (s/p total knee replacement)   Goals Addressed             This Visit's Progress    Patient Stated       No new goals       Depression Screen    01/16/2023    9:01 AM 10/10/2022    8:34 AM 01/12/2022    8:26 AM 08/22/2020    9:00 AM 08/22/2020    8:45 AM 05/27/2019    9:55 AM 04/16/2018    9:11 AM  PHQ 2/9 Scores  PHQ - 2 Score 0 0 0 0 0 0 0  PHQ- 9 Score  0         Fall Risk    01/16/2023    9:04 AM 10/10/2022    8:34 AM 01/12/2022    8:29 AM 08/22/2020    8:43 AM 08/22/2020    8:21 AM  Fall Risk   Falls in the past year? 0 0 0 0 0  Number falls in past yr: 0  0 0 0  Injury with Fall? 0  0 0   Risk for fall due to : No Fall Risks  No Fall Risks    Follow up Falls prevention discussed;Falls evaluation completed;Education provided   Falls evaluation completed     FALL RISK PREVENTION PERTAINING TO THE HOME:  Any stairs in or around the home? Yes  If so, are there any without handrails? No  Home free of loose throw rugs in walkways, pet beds, electrical cords, etc?  Yes  Adequate lighting in your home to reduce risk of falls? Yes   ASSISTIVE DEVICES UTILIZED TO PREVENT FALLS:  Life alert? No  Use of a cane, walker or w/c? No  Grab bars in the bathroom? Yes  Shower chair or bench in shower? Yes  Elevated toilet seat or a handicapped toilet? Yes    Cognitive Function:        01/16/2023    9:05 AM 01/12/2022    8:30 AM   6CIT Screen  What Year? 0 points 0 points  What month? 0 points 0 points  What time? 0 points 0 points  Count back from 20 0 points 0 points  Months in reverse 0 points 0 points  Repeat phrase 0 points 0 points  Total Score 0 points 0 points    Immunizations Immunization History  Administered Date(s) Administered   Fluad Quad(high Dose 65+) 05/27/2019, 06/21/2021   Influenza, High Dose Seasonal PF 07/22/2018   Influenza,inj,Quad PF,6+ Mos 06/05/2013, 06/21/2014, 05/31/2016, 06/05/2017, 07/21/2020   Influenza,inj,quad, With Preservative 06/10/2017   Influenza-Unspecified 05/27/2022   PFIZER Comirnaty(Gray Top)Covid-19 Tri-Sucrose Vaccine 03/17/2021   PFIZER(Purple Top)SARS-COV-2 Vaccination 10/24/2019, 11/18/2019, 08/07/2020   Pneumococcal Conjugate-13 07/27/2014   Pneumococcal Polysaccharide-23 06/05/2013   Tdap 02/11/2012    TDAP status: Due, Education has been provided regarding the importance of this vaccine. Advised may receive this vaccine at local pharmacy or Health Dept. Aware to provide a copy of the vaccination record if obtained from local pharmacy or Health Dept. Verbalized acceptance and understanding.  Flu Vaccine status: Up to date  Pneumococcal vaccine status: Up to date  Covid-19 vaccine status: Completed vaccines  Qualifies for Shingles Vaccine? Yes   Zostavax completed Yes   Shingrix Completed?: No.    Education has been provided regarding the importance of this vaccine. Patient has been advised to call insurance company to determine out of pocket expense if they have not yet received this vaccine. Advised may also receive vaccine at local pharmacy or Health Dept. Verbalized acceptance and understanding.  Screening Tests Health Maintenance  Topic Date Due   DEXA SCAN  09/25/2021   COVID-19 Vaccine (5 - 2023-24 season) 10/11/2023 (Originally 05/11/2022)   Zoster Vaccines- Shingrix (1 of 2) 10/11/2023 (Originally 12/25/1996)   INFLUENZA VACCINE  04/11/2023    MAMMOGRAM  06/27/2023   Medicare Annual Wellness (AWV)  01/16/2024   Pneumonia Vaccine 106+ Years old  Completed   Hepatitis C Screening  Completed   HPV VACCINES  Aged Out   DTaP/Tdap/Td  Discontinued   COLONOSCOPY (Pts 45-27yrs Insurance coverage will need to be confirmed)  Discontinued   Fecal DNA (Cologuard)  Discontinued    Health Maintenance  Health Maintenance Due  Topic Date Due   DEXA SCAN  09/25/2021    Colorectal cancer screening: No longer required.   Mammogram status: Completed 06/26/2022. Repeat every year  Bone Density status: Completed 09/25/2016. Results reflect: Bone density results: OSTEOPENIA. Repeat every 2 years. Pt declined  Lung Cancer Screening: (Low Dose CT Chest recommended if Age 52-80 years, 30 pack-year currently smoking OR have quit w/in 15years.) does not qualify.   Lung Cancer Screening Referral: no  Additional Screening:  Hepatitis C Screening: does qualify; Completed 03/22/16  Vision Screening: Recommended annual ophthalmology exams for early detection of glaucoma and other disorders of the eye. Is the patient up to date with their annual eye exam?  Yes  Who is the provider or what is the name of the office in which the patient  attends annual eye exams? [facility] If pt is not established with a provider, would they like to be referred to a provider to establish care? No .   Dental Screening: Recommended annual dental exams for proper oral hygiene  Community Resource Referral / Chronic Care Management: CRR required this visit?  No   CCM required this visit?  No      Plan:     I have personally reviewed and noted the following in the patient's chart:   Medical and social history Use of alcohol, tobacco or illicit drugs  Current medications and supplements including opioid prescriptions. Patient is not currently taking opioid prescriptions. Functional ability and status Nutritional status Physical activity Advanced  directives List of other physicians Hospitalizations, surgeries, and ER visits in previous 12 months Vitals Screenings to include cognitive, depression, and falls Referrals and appointments  In addition, I have reviewed and discussed with patient certain preventive protocols, quality metrics, and best practice recommendations. A written personalized care plan for preventive services as well as general preventive health recommendations were provided to patient.     Maryan Puls, LPN   09/15/1094   Nurse Notes: none

## 2023-01-20 ENCOUNTER — Telehealth: Payer: Medicare Other | Admitting: Family

## 2023-01-20 DIAGNOSIS — J019 Acute sinusitis, unspecified: Secondary | ICD-10-CM

## 2023-01-20 MED ORDER — AMOXICILLIN-POT CLAVULANATE 875-125 MG PO TABS: 1.0000 | ORAL_TABLET | Freq: Two times a day (BID) | ORAL | 0 refills | Status: DC

## 2023-01-20 NOTE — Progress Notes (Signed)

## 2023-01-21 DIAGNOSIS — M25562 Pain in left knee: Secondary | ICD-10-CM | POA: Diagnosis not present

## 2023-01-21 DIAGNOSIS — M25462 Effusion, left knee: Secondary | ICD-10-CM | POA: Diagnosis not present

## 2023-01-21 DIAGNOSIS — Z789 Other specified health status: Secondary | ICD-10-CM | POA: Diagnosis not present

## 2023-01-21 DIAGNOSIS — Z7409 Other reduced mobility: Secondary | ICD-10-CM | POA: Diagnosis not present

## 2023-01-21 DIAGNOSIS — R29898 Other symptoms and signs involving the musculoskeletal system: Secondary | ICD-10-CM | POA: Diagnosis not present

## 2023-01-21 DIAGNOSIS — M25662 Stiffness of left knee, not elsewhere classified: Secondary | ICD-10-CM | POA: Diagnosis not present

## 2023-01-24 DIAGNOSIS — R29898 Other symptoms and signs involving the musculoskeletal system: Secondary | ICD-10-CM | POA: Diagnosis not present

## 2023-01-24 DIAGNOSIS — Z789 Other specified health status: Secondary | ICD-10-CM | POA: Diagnosis not present

## 2023-01-24 DIAGNOSIS — M25462 Effusion, left knee: Secondary | ICD-10-CM | POA: Diagnosis not present

## 2023-01-24 DIAGNOSIS — M25562 Pain in left knee: Secondary | ICD-10-CM | POA: Diagnosis not present

## 2023-01-24 DIAGNOSIS — Z7409 Other reduced mobility: Secondary | ICD-10-CM | POA: Diagnosis not present

## 2023-01-24 DIAGNOSIS — M25662 Stiffness of left knee, not elsewhere classified: Secondary | ICD-10-CM | POA: Diagnosis not present

## 2023-01-24 DIAGNOSIS — Z09 Encounter for follow-up examination after completed treatment for conditions other than malignant neoplasm: Secondary | ICD-10-CM | POA: Diagnosis not present

## 2023-01-28 DIAGNOSIS — R29898 Other symptoms and signs involving the musculoskeletal system: Secondary | ICD-10-CM | POA: Diagnosis not present

## 2023-01-28 DIAGNOSIS — M25662 Stiffness of left knee, not elsewhere classified: Secondary | ICD-10-CM | POA: Diagnosis not present

## 2023-01-28 DIAGNOSIS — M25562 Pain in left knee: Secondary | ICD-10-CM | POA: Diagnosis not present

## 2023-01-28 DIAGNOSIS — Z789 Other specified health status: Secondary | ICD-10-CM | POA: Diagnosis not present

## 2023-01-28 DIAGNOSIS — M25462 Effusion, left knee: Secondary | ICD-10-CM | POA: Diagnosis not present

## 2023-01-28 DIAGNOSIS — Z7409 Other reduced mobility: Secondary | ICD-10-CM | POA: Diagnosis not present

## 2023-01-30 ENCOUNTER — Encounter: Payer: Self-pay | Admitting: Family Medicine

## 2023-01-31 MED ORDER — BENZONATATE 200 MG PO CAPS: 200.0000 mg | ORAL_CAPSULE | Freq: Three times a day (TID) | ORAL | 1 refills | Status: DC | PRN

## 2023-02-05 ENCOUNTER — Encounter: Payer: Medicare Other | Admitting: Family Medicine

## 2023-02-06 DIAGNOSIS — S63621A Sprain of interphalangeal joint of right thumb, initial encounter: Secondary | ICD-10-CM | POA: Diagnosis not present

## 2023-02-07 DIAGNOSIS — M25462 Effusion, left knee: Secondary | ICD-10-CM | POA: Diagnosis not present

## 2023-02-07 DIAGNOSIS — M25562 Pain in left knee: Secondary | ICD-10-CM | POA: Diagnosis not present

## 2023-02-07 DIAGNOSIS — M25662 Stiffness of left knee, not elsewhere classified: Secondary | ICD-10-CM | POA: Diagnosis not present

## 2023-02-07 DIAGNOSIS — G8929 Other chronic pain: Secondary | ICD-10-CM | POA: Diagnosis not present

## 2023-02-07 DIAGNOSIS — R29898 Other symptoms and signs involving the musculoskeletal system: Secondary | ICD-10-CM | POA: Diagnosis not present

## 2023-02-08 DIAGNOSIS — M79644 Pain in right finger(s): Secondary | ICD-10-CM | POA: Diagnosis not present

## 2023-02-08 DIAGNOSIS — S63629A Sprain of interphalangeal joint of unspecified thumb, initial encounter: Secondary | ICD-10-CM | POA: Diagnosis not present

## 2023-02-19 DIAGNOSIS — Z96652 Presence of left artificial knee joint: Secondary | ICD-10-CM | POA: Diagnosis not present

## 2023-03-04 ENCOUNTER — Ambulatory Visit (INDEPENDENT_AMBULATORY_CARE_PROVIDER_SITE_OTHER): Payer: Medicare Other | Admitting: Internal Medicine

## 2023-03-04 ENCOUNTER — Encounter: Payer: Self-pay | Admitting: Internal Medicine

## 2023-03-04 VITALS — BP 130/70 | HR 79 | Temp 97.3°F | Ht 69.0 in | Wt 150.0 lb

## 2023-03-04 DIAGNOSIS — J0101 Acute recurrent maxillary sinusitis: Secondary | ICD-10-CM | POA: Insufficient documentation

## 2023-03-04 MED ORDER — AMOXICILLIN-POT CLAVULANATE 875-125 MG PO TABS: 1.0000 | ORAL_TABLET | Freq: Two times a day (BID) | ORAL | 0 refills | Status: DC

## 2023-03-04 NOTE — Progress Notes (Signed)
Subjective:    Patient ID: Christine Daniels, female    DOB: 17-Apr-1946, 77 y.o.   MRN: [id number]  HPI Here due to recurrent sinus symptoms  Seen in early May for sinus symptoms Thinks he caught something from husband after TKR Clear nasal discharge She had lingering symptoms--but thought to be viral at first visit Then got augmentin at e-visit over a month ago Not sure she ever completely resolved--but was a lot better Uses claritin prn also  Now with congestion again Severe tooth pain also in past day--left maxilla (does get regular dental care) ?left ear/sinus congestion Using OTC meds --BC Using heat and tylenol--helps a little  Tooth somewhat better today Just has ongoing facial pain  Current Outpatient Medications on File Prior to Visit  Medication Sig Dispense Refill   Acetaminophen (TYLENOL) 325 MG CAPS Tylenol     Coenzyme Q10 (CO Q10) 100 MG TABS Take 1 tablet by mouth every other day.     loratadine-pseudoephedrine (CLARITIN-D 12-HOUR) 5-120 MG per tablet Take 1 tablet by mouth daily as needed.     losartan (COZAAR) 25 MG tablet Take 1 tablet (25 mg total) by mouth daily. 90 tablet 3   metoprolol succinate (TOPROL-XL) 25 MG 24 hr tablet TAKE 1 TABLET BY MOUTH EVERY DAY 90 tablet 3   potassium chloride SA (KLOR-CON M20) 20 MEQ tablet TAKE 1 TABLET (20 MEQ TOTAL) BY MOUTH EVERY OTHER DAY. 45 tablet 4   rosuvastatin (CRESTOR) 5 MG tablet Take 1 tablet (5 mg total) by mouth every other day. 45 tablet 3   triamterene-hydrochlorothiazide (MAXZIDE) 75-50 MG per tablet Take 1 tablet by mouth daily. 100 tablet 3   No current facility-administered medications on file prior to visit.    No Known Allergies  Past Medical History:  Diagnosis Date   Arthritis    Carotid artery occlusion    CEREBROVASCULAR DISEASE    HYPERLIPIDEMIA    Hypertension    Migraine     Past Surgical History:  Procedure Laterality Date   ABDOMINAL HYSTERECTOMY  09/10/2008   BREAST BIOPSY   09/10/1985   REPLACEMENT TOTAL KNEE Left 11/2022   Status post bilateral tubal ligation.      Family History  Problem Relation Age of Onset   Breast cancer Mother 8   Hyperlipidemia Father    Stroke Father 9   Heart disease Brother    Cancer Son        glioma    Social History   Socioeconomic History   Marital status: Married    Spouse name: Jonne Ply    Number of children: 2   Years of education: College   Highest education level: Not on file  Occupational History   Not on file  Tobacco Use   Smoking status: Former    Packs/day: 0.25    Years: 3.00    Additional pack years: 0.00    Total pack years: 0.75    Types: Cigarettes    Quit date: 09/10/1977    Years since quitting: 45.5   Smokeless tobacco: Never  Vaping Use   Vaping Use: Never used  Substance and Sexual Activity   Alcohol use: Yes    Alcohol/week: 1.0 standard drink of alcohol    Types: 1 Standard drinks or equivalent per week    Comment: occasionally a beer, 1-2 at a time   Drug use: No   Sexual activity: Yes    Birth control/protection: Post-menopausal, Surgical  Other Topics Concern   Not  on file  Social History Narrative   She has remote history of minimal tobacco use, but has     not smoked in years.  She rarely consumes alcohol.    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[employer]      Family: has 2 adult children - Museum/gallery exhibitions officer and Rob - 2 grandchildren      Enjoys: play golf, spend time with friends      Exercise: walking - not as much as she should   Diet: egg and toast, works through lunch but eats, healthy food at dinner      Safety   Seat belts: Yes    Guns: No   Safe in relationships: Yes    Social Determinants of Corporate investment banker Strain: Low Risk  (01/16/2023)   Overall Financial Resource Strain (CARDIA)    Difficulty of Paying Living Expenses: Not hard at all  Food Insecurity: No Food  Insecurity (01/16/2023)   Hunger Vital Sign    Worried About Running Out of Food in the Last Year: Never true    Ran Out of Food in the Last Year: Never true  Transportation Needs: No Transportation Needs (01/16/2023)   PRAPARE - Administrator, Civil Service (Medical): No    Lack of Transportation (Non-Medical): No  Physical Activity: Sufficiently Active (01/16/2023)   Exercise Vital Sign    Days of Exercise per Week: 6 days    Minutes of Exercise per Session: 60 min  Stress: No Stress Concern Present (01/16/2023)   [employer] of Occupational Health - Occupational Stress Questionnaire    Feeling of Stress : Not at all  Social Connections: Moderately Integrated (01/16/2023)   Social Connection and Isolation Panel [NHANES]    Frequency of Communication with Friends and Family: More than three times a week    Frequency of Social Gatherings with Friends and Family: More than three times a week    Attends Religious Services: More than 4 times per year    Active Member of [employer] or Organizations: No    Attends Banker Meetings: Never    Marital Status: Married  Catering manager Violence: Not At Risk (01/16/2023)   Humiliation, Afraid, Rape, and Kick questionnaire    Fear of Current or Ex-Partner: No    Emotionally Abused: No    Physically Abused: No    Sexually Abused: No   Review of Systems Temp higher--but no fever No SOB No cough    Objective:   Physical Exam Constitutional:      Appearance: Normal appearance.  HENT:     Head:     Comments: Mild left maxillary and frontal tenderness    Right Ear: Tympanic membrane and ear canal normal.     Left Ear: Tympanic membrane and ear canal normal.     Mouth/Throat:     Pharynx: No oropharyngeal exudate or posterior oropharyngeal erythema.     Comments: No apparent infected teeth Pulmonary:     Effort: Pulmonary effort is normal.     Breath sounds: Normal breath sounds. No wheezing or rales.  Musculoskeletal:      Cervical back: Neck supple.  Lymphadenopathy:     Cervical: No cervical adenopathy.  Neurological:     Mental Status: She is alert.            Assessment &  Plan:

## 2023-03-04 NOTE — Assessment & Plan Note (Signed)
Clearly has sinus symptoms but unclear if true bacterial infection again Discussed supportive care--with tylenol If symptoms persist, can fill Rx for augmentin 875 bid x 7 days (printed)

## 2023-03-13 DIAGNOSIS — H2513 Age-related nuclear cataract, bilateral: Secondary | ICD-10-CM | POA: Diagnosis not present

## 2023-03-27 DIAGNOSIS — S63629A Sprain of interphalangeal joint of unspecified thumb, initial encounter: Secondary | ICD-10-CM | POA: Diagnosis not present

## 2023-04-09 DIAGNOSIS — M5416 Radiculopathy, lumbar region: Secondary | ICD-10-CM | POA: Diagnosis not present

## 2023-05-05 ENCOUNTER — Telehealth: Payer: Medicare Other | Admitting: Family

## 2023-05-05 DIAGNOSIS — R399 Unspecified symptoms and signs involving the genitourinary system: Secondary | ICD-10-CM

## 2023-05-05 MED ORDER — CEPHALEXIN 500 MG PO CAPS: 500.0000 mg | ORAL_CAPSULE | Freq: Two times a day (BID) | ORAL | 0 refills | Status: DC

## 2023-05-05 NOTE — Progress Notes (Signed)
E-Visit for Urinary Problems  We are sorry that you are not feeling well.  Here is how we plan to help!  Based on what you shared with me it looks like you most likely have a simple urinary tract infection.  A UTI (Urinary Tract Infection) is a bacterial infection of the bladder.  Most cases of urinary tract infections are simple to treat but a key part of your care is to encourage you to drink plenty of fluids and watch your symptoms carefully.  I have prescribed Keflex 500 mg twice a day for 7 days.  Your symptoms should gradually improve. Call us if the burning in your urine worsens, you develop worsening fever, back pain or pelvic pain or if your symptoms do not resolve after completing the antibiotic.  Urinary tract infections can be prevented by drinking plenty of water to keep your body hydrated.  Also be sure when you wipe, wipe from front to back and don't hold it in!  If possible, empty your bladder every 4 hours.  HOME CARE Drink plenty of fluids Compete the full course of the antibiotics even if the symptoms resolve Remember, when you need to go.go. Holding in your urine can increase the likelihood of getting a UTI! GET HELP RIGHT AWAY IF: You cannot urinate You get a high fever Worsening back pain occurs You see blood in your urine You feel sick to your stomach or throw up You feel like you are going to pass out  MAKE SURE YOU  Understand these instructions. Will watch your condition. Will get help right away if you are not doing well or get worse.   Thank you for choosing an e-visit.  Your e-visit answers were reviewed by a board certified advanced clinical practitioner to complete your personal care plan. Depending upon the condition, your plan could have included both over the counter or prescription medications.  Please review your pharmacy choice. Make sure the pharmacy is open so you can pick up prescription now. If there is a problem, you may contact your  provider through MyChart messaging and have the prescription routed to another pharmacy.  Your safety is important to us. If you have drug allergies check your prescription carefully.   For the next 24 hours you can use MyChart to ask questions about today's visit, request a non-urgent call back, or ask for a work or school excuse. You will get an email in the next two days asking about your experience. I hope that your e-visit has been valuable and will speed your recovery.  Approximately 5 minutes was spent documenting and reviewing patient's chart.    

## 2023-07-03 DIAGNOSIS — Z1231 Encounter for screening mammogram for malignant neoplasm of breast: Secondary | ICD-10-CM | POA: Diagnosis not present

## 2023-07-10 ENCOUNTER — Telehealth: Payer: Medicare Other | Admitting: Physician Assistant

## 2023-07-10 DIAGNOSIS — N390 Urinary tract infection, site not specified: Secondary | ICD-10-CM

## 2023-07-10 NOTE — Progress Notes (Signed)
Because of back pain and low-grade fever raising concern for more complicated infection and that the standard of care with these symptoms in an individual over 65 is to get a urine culture, I feel your condition warrants further evaluation and I recommend that you be seen in a face to face visit.   NOTE: There will be NO CHARGE for this eVisit   If you are having a true medical emergency please call 911.      For an urgent face to face visit, [place] has eight urgent care centers for your convenience:   NEW!! [facility] Health Urgent Care Center at [facility] Get Driving Directions [phone] [address], [address] [place], [address]    [facility] Health Urgent Care Center at [facility] Get Driving Directions [phone] [address] [place], [place] [address]   [facility] Health Urgent Care Center [facility]) Get Driving Directions [phone] [address] [place], [place] [address]  [facility] Health Urgent Care Center [facility]) Get Driving Directions [phone] [address] [place],  [place]  [address]  [facility] Health Urgent Care Center [facility] - at [employer]  [phone] [place],  [place] [address]   [facility] Health Urgent Care at [facility] Get Driving Directions [phone] [address] [place], [place] [address]   [facility] Health Urgent Care at [facility] Get Driving Directions  [phone] [address] [place], [place] [address]   [facility] Health Urgent Care at [facility] Directions [phone] [address], [address] [place], [place] [address]  Your MyChart E-visit questionnaire answers were reviewed by a board certified advanced clinical practitioner to complete your personal care plan based on your specific symptoms.  Thank you for using e-Visits.

## 2023-07-21 ENCOUNTER — Encounter: Payer: Self-pay | Admitting: Family Medicine

## 2023-08-02 ENCOUNTER — Ambulatory Visit (INDEPENDENT_AMBULATORY_CARE_PROVIDER_SITE_OTHER): Payer: Medicare Other | Admitting: Family Medicine

## 2023-08-02 ENCOUNTER — Encounter: Payer: Self-pay | Admitting: Family Medicine

## 2023-08-02 VITALS — BP 124/70 | HR 77 | Temp 98.0°F | Ht 69.0 in | Wt 157.4 lb

## 2023-08-02 DIAGNOSIS — R300 Dysuria: Secondary | ICD-10-CM | POA: Diagnosis not present

## 2023-08-02 LAB — POC URINALSYSI DIPSTICK (AUTOMATED)
Bilirubin, UA: NEGATIVE
Blood, UA: NEGATIVE
Glucose, UA: NEGATIVE
Ketones, UA: NEGATIVE
Leukocytes, UA: NEGATIVE
Nitrite, UA: NEGATIVE
Protein, UA: NEGATIVE
Spec Grav, UA: 1.01 (ref 1.010–1.025)
Urobilinogen, UA: 0.2 U/dL
pH, UA: 6.5 (ref 5.0–8.0)

## 2023-08-02 NOTE — Progress Notes (Signed)
Patient ID: BRIXLEY DIOGUARDI, female    DOB: 1945-10-31, 77 y.o.   MRN: [id number]  This visit was conducted in person.  BP 124/70 (BP Location: Left Arm, Patient Position: Sitting, Cuff Size: Normal)   Pulse 77   Temp 98 F (36.7 C) (Temporal)   Ht 5\' 9"  (1.753 m)   Wt 157 lb 6 oz (71.4 kg)   SpO2 100%   BMI 23.24 kg/m    CC:  Chief Complaint  Patient presents with   Burning with Urination   Back Ache    Subjective:   HPI: Christine Daniels is a 77 y.o. female presenting on 08/02/2023 for Burning with Urination and Back Ache    2-3 days ago she has noted burning with urination intermittently. Some increase in frequency and urgency. No blood in urine.  Bilateral low back ache but has chronic back pain.  She has been trying to increase water intake... doesn't usually get enough.   No vaginal discharge or itching or sore.   No fever, no abd pain.   She has cranberry.   No recent UTI dx , no recent antibiotics. Has been using tylenol for knee issues.   No new mediations.  Relevant past medical, surgical, family and social history reviewed and updated as indicated. Interim medical history since our last visit reviewed. Allergies and medications reviewed and updated. Outpatient Medications Prior to Visit  Medication Sig Dispense Refill   Acetaminophen (TYLENOL) 325 MG CAPS Tylenol     Coenzyme Q10 (CO Q10) 100 MG TABS Take 1 tablet by mouth every other day.     loratadine-pseudoephedrine (CLARITIN-D 12-HOUR) 5-120 MG per tablet Take 1 tablet by mouth daily as needed.     losartan (COZAAR) 25 MG tablet Take 1 tablet (25 mg total) by mouth daily. 90 tablet 3   metoprolol succinate (TOPROL-XL) 25 MG 24 hr tablet TAKE 1 TABLET BY MOUTH EVERY DAY 90 tablet 3   potassium chloride SA (KLOR-CON M20) 20 MEQ tablet TAKE 1 TABLET (20 MEQ TOTAL) BY MOUTH EVERY OTHER DAY. 45 tablet 4   rosuvastatin (CRESTOR) 5 MG tablet Take 1 tablet (5 mg total) by mouth every other day. 45 tablet 3    triamterene-hydrochlorothiazide (MAXZIDE) 75-50 MG per tablet Take 1 tablet by mouth daily. 100 tablet 3   amoxicillin-clavulanate (AUGMENTIN) 875-125 MG tablet Take 1 tablet by mouth 2 (two) times daily. 14 tablet 0   cephALEXin (KEFLEX) 500 MG capsule Take 1 capsule (500 mg total) by mouth 2 (two) times daily. 14 capsule 0   No facility-administered medications prior to visit.     Per HPI unless specifically indicated in ROS section below Review of Systems  Constitutional:  Negative for fatigue and fever.  HENT:  Negative for congestion.   Eyes:  Negative for pain.  Respiratory:  Negative for cough and shortness of breath.   Cardiovascular:  Negative for chest pain, palpitations and leg swelling.  Gastrointestinal:  Negative for abdominal pain.  Genitourinary:  Negative for dysuria and vaginal bleeding.  Musculoskeletal:  Negative for back pain.  Neurological:  Negative for syncope, light-headedness and headaches.  Psychiatric/Behavioral:  Negative for dysphoric mood.    Objective:  BP 124/70 (BP Location: Left Arm, Patient Position: Sitting, Cuff Size: Normal)   Pulse 77   Temp 98 F (36.7 C) (Temporal)   Ht 5\' 9"  (1.753 m)   Wt 157 lb 6 oz (71.4 kg)   SpO2 100%   BMI 23.24 kg/m  Wt Readings from Last 3 Encounters:  08/02/23 157 lb 6 oz (71.4 kg)  03/04/23 150 lb (68 kg)  01/16/23 147 lb (66.7 kg)      Physical Exam Constitutional:      General: She is not in acute distress.    Appearance: Normal appearance. She is well-developed. She is not ill-appearing or toxic-appearing.  HENT:     Head: Normocephalic.     Right Ear: Hearing, tympanic membrane, ear canal and external ear normal. Tympanic membrane is not erythematous, retracted or bulging.     Left Ear: Hearing, tympanic membrane, ear canal and external ear normal. Tympanic membrane is not erythematous, retracted or bulging.     Nose: No mucosal edema or rhinorrhea.     Right Sinus: No maxillary sinus tenderness  or frontal sinus tenderness.     Left Sinus: No maxillary sinus tenderness or frontal sinus tenderness.     Mouth/Throat:     Mouth: Oropharynx is clear and moist and mucous membranes are normal.     Pharynx: Uvula midline.  Eyes:     General: Lids are normal. Lids are everted, no foreign bodies appreciated.     Extraocular Movements: EOM normal.     Conjunctiva/sclera: Conjunctivae normal.     Pupils: Pupils are equal, round, and reactive to light.  Neck:     Thyroid: No thyroid mass or thyromegaly.     Vascular: No carotid bruit.     Trachea: Trachea normal.  Cardiovascular:     Rate and Rhythm: Normal rate and regular rhythm.     Pulses: Normal pulses.     Heart sounds: Normal heart sounds, S1 normal and S2 normal. No murmur heard.    No friction rub. No gallop.  Pulmonary:     Effort: Pulmonary effort is normal. No tachypnea or respiratory distress.     Breath sounds: Normal breath sounds. No decreased breath sounds, wheezing, rhonchi or rales.  Abdominal:     General: Bowel sounds are normal.     Palpations: Abdomen is soft.     Tenderness: There is no abdominal tenderness. There is no right CVA tenderness or left CVA tenderness.     Comments:  Scoliosis noted  Musculoskeletal:     Cervical back: Normal range of motion and neck supple.  Skin:    General: Skin is warm, dry and intact.     Findings: No rash.  Neurological:     Mental Status: She is alert.  Psychiatric:        Mood and Affect: Mood is not anxious or depressed.        Speech: Speech normal.        Behavior: Behavior normal. Behavior is cooperative.        Thought Content: Thought content normal.        Cognition and Memory: Cognition and memory normal.        Judgment: Judgment normal.       Results for orders placed or performed in visit on 08/02/23  POCT Urinalysis Dipstick (Automated)  Result Value Ref Range   Color, UA Yellow    Clarity, UA Clear    Glucose, UA Negative Negative   Bilirubin, UA  Negative    Ketones, UA Negative    Spec Grav, UA 1.010 1.010 - 1.025   Blood, UA Negative    pH, UA 6.5 5.0 - 8.0   Protein, UA Negative Negative   Urobilinogen, UA 0.2 0.2 or 1.0 E.U./dL   Nitrite, UA  Negative    Leukocytes, UA Negative Negative    Assessment and Plan  Burning with urination Assessment & Plan: Acute, intermittent Urinalysis clear. Most likely secondary to intermittent recurrent bladder irritation and mild dehydration.  Recommend avoiding bladder irritants, pushing water can use cranberry.  If symptoms not improving as expected consider referral to urology for evaluation for interstitial cystitis.  Orders: -     POCT Urinalysis Dipstick (Automated)    Return in about 6 months (around 01/30/2024) for after phone AMW, fasting labs then CPE with me.   Kerby Nora, MD

## 2023-08-02 NOTE — Assessment & Plan Note (Signed)
Acute, intermittent Urinalysis clear. Most likely secondary to intermittent recurrent bladder irritation and mild dehydration.  Recommend avoiding bladder irritants, pushing water can use cranberry.  If symptoms not improving as expected consider referral to urology for evaluation for interstitial cystitis.

## 2023-08-18 ENCOUNTER — Other Ambulatory Visit: Payer: Self-pay | Admitting: Cardiology

## 2023-08-18 DIAGNOSIS — I10 Essential (primary) hypertension: Secondary | ICD-10-CM

## 2023-08-31 ENCOUNTER — Other Ambulatory Visit: Payer: Self-pay | Admitting: Cardiology

## 2023-09-02 ENCOUNTER — Encounter: Payer: Self-pay | Admitting: Internal Medicine

## 2023-09-02 ENCOUNTER — Ambulatory Visit (INDEPENDENT_AMBULATORY_CARE_PROVIDER_SITE_OTHER): Payer: Medicare Other | Admitting: Internal Medicine

## 2023-09-02 VITALS — BP 100/60 | HR 100 | Temp 98.9°F | Ht 69.0 in | Wt 154.0 lb

## 2023-09-02 DIAGNOSIS — Z20822 Contact with and (suspected) exposure to covid-19: Secondary | ICD-10-CM

## 2023-09-02 DIAGNOSIS — U071 COVID-19: Secondary | ICD-10-CM | POA: Diagnosis not present

## 2023-09-02 LAB — POC COVID19 BINAXNOW: SARS Coronavirus 2 Ag: POSITIVE — AB

## 2023-09-02 MED ORDER — NIRMATRELVIR/RITONAVIR (PAXLOVID) TABLET (RENAL DOSING): 2.0000 | ORAL_TABLET | Freq: Two times a day (BID) | ORAL | 0 refills | Status: AC

## 2023-09-02 MED ORDER — BENZONATATE 200 MG PO CAPS: 200.0000 mg | ORAL_CAPSULE | Freq: Three times a day (TID) | ORAL | 0 refills | Status: DC | PRN

## 2023-09-02 NOTE — Progress Notes (Signed)
Subjective:    Patient ID: Christine Daniels, female    DOB: [birth year], 77 y.o.   MRN: [id number]  HPI Here due to respiratory symptoms  Husband got sick middle of last week---~5 days ago Then really worse 3 days ago--was seen then COVID positive and taking paxlovid (with great response)  Her symptoms started 2 days ago Headache--hard to even get out of bed this morning Ongoing cough--kept her up last night No sig sputum No SOB Some sore throat No ear pain No fever but some chills then sweats  Only taking tylenol  Current Outpatient Medications on File Prior to Visit  Medication Sig Dispense Refill   Acetaminophen (TYLENOL) 325 MG CAPS Tylenol     Coenzyme Q10 (CO Q10) 100 MG TABS Take 1 tablet by mouth every other day.     loratadine-pseudoephedrine (CLARITIN-D 12-HOUR) 5-120 MG per tablet Take 1 tablet by mouth daily as needed.     losartan (COZAAR) 25 MG tablet TAKE 1 TABLET (25 MG TOTAL) BY MOUTH DAILY. 30 tablet 1   metoprolol succinate (TOPROL-XL) 25 MG 24 hr tablet TAKE 1 TABLET BY MOUTH EVERY DAY 90 tablet 0   potassium chloride SA (KLOR-CON M20) 20 MEQ tablet TAKE 1 TABLET (20 MEQ TOTAL) BY MOUTH EVERY OTHER DAY. 45 tablet 4   rosuvastatin (CRESTOR) 5 MG tablet Take 1 tablet (5 mg total) by mouth every other day. 45 tablet 3   triamterene-hydrochlorothiazide (MAXZIDE) 75-50 MG per tablet Take 1 tablet by mouth daily. 100 tablet 3   No current facility-administered medications on file prior to visit.    No Known Allergies  Past Medical History:  Diagnosis Date   Arthritis    Carotid artery occlusion    CEREBROVASCULAR DISEASE    HYPERLIPIDEMIA    Hypertension    Migraine     Past Surgical History:  Procedure Laterality Date   ABDOMINAL HYSTERECTOMY  09/10/2008   BREAST BIOPSY  09/10/1985   REPLACEMENT TOTAL KNEE Left 11/2022   Status post bilateral tubal ligation.      Family History  Problem Relation Age of Onset   Breast cancer Mother 67    Hyperlipidemia Father    Stroke Father 31   Heart disease Brother    Cancer Son        glioma    Social History   Socioeconomic History   Marital status: Married    Spouse name: Jonne Ply    Number of children: 2   Years of education: College   Highest education level: Bachelor's degree (e.g., BA, AB, BS)  Occupational History   Not on file  Tobacco Use   Smoking status: Former    Current packs/day: 0.00    Average packs/day: 0.3 packs/day for 3.0 years (0.8 ttl pk-yrs)    Types: Cigarettes    Start date: 09/10/1974    Quit date: 09/10/1977    Years since quitting: 46.0   Smokeless tobacco: Never  Vaping Use   Vaping status: Never Used  Substance and Sexual Activity   Alcohol use: Yes    Alcohol/week: 1.0 standard drink of alcohol    Types: 1 Standard drinks or equivalent per week    Comment: occasionally a beer, 1-2 at a time   Drug use: No   Sexual activity: Yes    Birth control/protection: Post-menopausal, Surgical  Other Topics Concern   Not on file  Social History Narrative   She has remote history of minimal tobacco use, but has  not smoked in years.  She rarely consumes alcohol.    Desires CPR      01/28/20   From: the area   Living: with husband, Jonne Ply [phone])    Work: Clinical biochemist rep for an [employer]      Family: has 2 adult children - Museum/gallery exhibitions officer and Rob - 2 grandchildren      Enjoys: play golf, spend time with friends      Exercise: walking - not as much as she should   Diet: egg and toast, works through lunch but eats, healthy food at dinner      Safety   Seat belts: Yes    Guns: No   Safe in relationships: Yes    Social Drivers of Corporate investment banker Strain: Low Risk  (08/01/2023)   Overall Financial Resource Strain (CARDIA)    Difficulty of Paying Living Expenses: Not hard at all  Food Insecurity: No Food Insecurity (08/01/2023)   Hunger Vital Sign    Worried About Running Out of Food in the Last Year: Never true    Ran Out of  Food in the Last Year: Never true  Transportation Needs: No Transportation Needs (08/01/2023)   PRAPARE - Administrator, Civil Service (Medical): No    Lack of Transportation (Non-Medical): No  Physical Activity: Insufficiently Active (08/01/2023)   Exercise Vital Sign    Days of Exercise per Week: 3 days    Minutes of Exercise per Session: 30 min  Stress: No Stress Concern Present (08/01/2023)   [employer] of Occupational Health - Occupational Stress Questionnaire    Feeling of Stress : Only a little  Social Connections: Socially Integrated (08/01/2023)   Social Connection and Isolation Panel [NHANES]    Frequency of Communication with Friends and Family: More than three times a week    Frequency of Social Gatherings with Friends and Family: Twice a week    Attends Religious Services: More than 4 times per year    Active Member of [employer] or Organizations: Yes    Attends Banker Meetings: 1 to 4 times per year    Marital Status: Married  Catering manager Violence: Not At Risk (01/16/2023)   Humiliation, Afraid, Rape, and Kick questionnaire    Fear of Current or Ex-Partner: No    Emotionally Abused: No    Physically Abused: No    Sexually Abused: No   Review of Systems No change in smell or taste Some nausea this morning--no vomiting Able to eat--though limited    Objective:   Physical Exam Constitutional:      Appearance: Normal appearance.  HENT:     Head:     Comments: No sinus tenderness (did have yesterday)    Right Ear: Tympanic membrane and ear canal normal.     Left Ear: Tympanic membrane and ear canal normal.     Mouth/Throat:     Pharynx: No oropharyngeal exudate or posterior oropharyngeal erythema.  Pulmonary:     Effort: Pulmonary effort is normal.     Breath sounds: Normal breath sounds. No wheezing or rales.  Musculoskeletal:     Cervical back: Neck supple.  Lymphadenopathy:     Cervical: No cervical adenopathy.   Neurological:     Mental Status: She is alert.            Assessment & Plan:

## 2023-09-02 NOTE — Assessment & Plan Note (Signed)
Mild but some systemic symptoms Discussed quarantine for this week---then out with mask next week Continue tylenol Will give paxlovid --renal dose---and benzonatate ER if any SOB

## 2023-09-12 ENCOUNTER — Other Ambulatory Visit: Payer: Self-pay | Admitting: Cardiology

## 2023-09-12 ENCOUNTER — Encounter: Payer: Self-pay | Admitting: Family Medicine

## 2023-09-12 DIAGNOSIS — I10 Essential (primary) hypertension: Secondary | ICD-10-CM

## 2023-10-07 NOTE — Progress Notes (Signed)
 HPI: FU hypertension; also with cerebrovascular disease followed by vascular surgery.  Since last seen, the patient denies any dyspnea on exertion, orthopnea, PND, pedal edema, palpitations, syncope or chest pain.   Current Outpatient Medications  Medication Sig Dispense Refill   Acetaminophen (TYLENOL) 325 MG CAPS Tylenol     Coenzyme Q10 (CO Q10) 100 MG TABS Take 1 tablet by mouth every other day.     loratadine-pseudoephedrine (CLARITIN-D 12-HOUR) 5-120 MG per tablet Take 1 tablet by mouth daily as needed.     losartan  (COZAAR ) 25 MG tablet TAKE 1 TABLET (25 MG TOTAL) BY MOUTH DAILY. 90 tablet 0   metoprolol  succinate (TOPROL -XL) 25 MG 24 hr tablet TAKE 1 TABLET BY MOUTH EVERY DAY 90 tablet 0   potassium chloride  SA (KLOR-CON  M20) 20 MEQ tablet TAKE 1 TABLET (20 MEQ TOTAL) BY MOUTH EVERY OTHER DAY. 45 tablet 4   rosuvastatin  (CRESTOR ) 5 MG tablet Take 1 tablet (5 mg total) by mouth every other day. 45 tablet 3   triamterene -hydrochlorothiazide (MAXZIDE) 75-50 MG per tablet Take 1 tablet by mouth daily. 100 tablet 3   No current facility-administered medications for this visit.     Past Medical History:  Diagnosis Date   Arthritis    Carotid artery occlusion    CEREBROVASCULAR DISEASE    HYPERLIPIDEMIA    Hypertension    Migraine     Past Surgical History:  Procedure Laterality Date   ABDOMINAL HYSTERECTOMY  09/10/2008   BREAST BIOPSY  09/10/1985   REPLACEMENT TOTAL KNEE Left 11/2022   Status post bilateral tubal ligation.      Social History   Socioeconomic History   Marital status: Married    Spouse name: Dorethia    Number of children: 2   Years of education: College   Highest education level: Bachelor's degree (e.g., BA, AB, BS)  Occupational History   Not on file  Tobacco Use   Smoking status: Former    Current packs/day: 0.00    Average packs/day: 0.3 packs/day for 3.0 years (0.8 ttl pk-yrs)    Types: Cigarettes    Start date: 09/10/1974    Quit date:  09/10/1977    Years since quitting: 46.1   Smokeless tobacco: Never  Vaping Use   Vaping status: Never Used  Substance and Sexual Activity   Alcohol use: Yes    Alcohol/week: 1.0 standard drink of alcohol    Types: 1 Standard drinks or equivalent per week    Comment: occasionally a beer, 1-2 at a time   Drug use: No   Sexual activity: Yes    Birth control/protection: Post-menopausal, Surgical  Other Topics Concern   Not on file  Social History Narrative   She has remote history of minimal tobacco use, but has     not smoked in years.  She rarely consumes alcohol.    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[employer]      Family: has 2 adult children - Museum/gallery Exhibitions Officer and Rob - 2 grandchildren      Enjoys: play golf, spend time with friends      Exercise: walking - not as much as she should   Diet: egg and toast, works through lunch but eats, healthy food at dinner      Safety   Seat belts: Yes    Guns: No  Safe in relationships: Yes    Social Drivers of Corporate Investment Banker Strain: Low Risk  (08/01/2023)   Overall Financial Resource Strain (CARDIA)    Difficulty of Paying Living Expenses: Not hard at all  Food Insecurity: No Food Insecurity (08/01/2023)   Hunger Vital Sign    Worried About Running Out of Food in the Last Year: Never true    Ran Out of Food in the Last Year: Never true  Transportation Needs: No Transportation Needs (08/01/2023)   PRAPARE - Administrator, Civil Service (Medical): No    Lack of Transportation (Non-Medical): No  Physical Activity: Insufficiently Active (08/01/2023)   Exercise Vital Sign    Days of Exercise per Week: 3 days    Minutes of Exercise per Session: 30 min  Stress: No Stress Concern Present (08/01/2023)   [employer] of Occupational Health - Occupational Stress Questionnaire    Feeling of Stress : Only a little  Social  Connections: Socially Integrated (08/01/2023)   Social Connection and Isolation Panel [NHANES]    Frequency of Communication with Friends and Family: More than three times a week    Frequency of Social Gatherings with Friends and Family: Twice a week    Attends Religious Services: More than 4 times per year    Active Member of [employer] or Organizations: Yes    Attends Banker Meetings: 1 to 4 times per year    Marital Status: Married  Catering Manager Violence: Not At Risk (01/16/2023)   Humiliation, Afraid, Rape, and Kick questionnaire    Fear of Current or Ex-Partner: No    Emotionally Abused: No    Physically Abused: No    Sexually Abused: No    Family History  Problem Relation Age of Onset   Breast cancer Mother 31   Hyperlipidemia Father    Stroke Father 36   Heart disease Brother    Cancer Son        glioma    ROS: no fevers or chills, productive cough, hemoptysis, dysphasia, odynophagia, melena, hematochezia, dysuria, hematuria, rash, seizure activity, orthopnea, PND, pedal edema, claudication. Remaining systems are negative.  Physical Exam: Well-developed well-nourished in no acute distress.  Skin is warm and dry.  HEENT is normal.  Neck is supple.  Chest is clear to auscultation with normal expansion.  Cardiovascular exam is regular rate and rhythm.  Abdominal exam nontender or distended. No masses palpated. Extremities show no edema. neuro grossly intact  EKG Interpretation Date/Time:  Tuesday October 15 2023 08:00:25 EST Ventricular Rate:  86 PR Interval:  156 QRS Duration:  84 QT Interval:  376 QTC Calculation: 449 R Axis:   12  Text Interpretation: Sinus rhythm with sinus arrhythmia with occasional Premature ventricular complexes Confirmed by Pietro Rogue ([id number]) on 10/15/2023 8:01:23 AM    A/P  1 Htn-BP controlled; continue present meds.  Potassium and renal function monitored by primary care.  2 hyperlipidemia-continue statin.  Lipids  and liver monitored by primary care.  3 carotid artery disease-patient is followed by vascular surgery.  Continue aspirin and statin.  Rogue Pietro, MD

## 2023-10-15 ENCOUNTER — Ambulatory Visit: Payer: Medicare Other | Attending: Cardiology | Admitting: Cardiology

## 2023-10-15 ENCOUNTER — Encounter: Payer: Self-pay | Admitting: Cardiology

## 2023-10-15 VITALS — BP 128/60 | HR 60 | Ht 68.5 in | Wt 155.0 lb

## 2023-10-15 DIAGNOSIS — I779 Disorder of arteries and arterioles, unspecified: Secondary | ICD-10-CM

## 2023-10-15 DIAGNOSIS — I10 Essential (primary) hypertension: Secondary | ICD-10-CM | POA: Diagnosis not present

## 2023-10-15 DIAGNOSIS — E7800 Pure hypercholesterolemia, unspecified: Secondary | ICD-10-CM

## 2023-10-15 NOTE — Patient Instructions (Signed)

## 2023-11-07 DIAGNOSIS — Z96652 Presence of left artificial knee joint: Secondary | ICD-10-CM | POA: Diagnosis not present

## 2023-12-01 ENCOUNTER — Other Ambulatory Visit: Payer: Self-pay | Admitting: Cardiology

## 2023-12-27 ENCOUNTER — Other Ambulatory Visit: Payer: Self-pay | Admitting: Cardiology

## 2023-12-27 DIAGNOSIS — I10 Essential (primary) hypertension: Secondary | ICD-10-CM

## 2024-01-02 ENCOUNTER — Telehealth: Payer: Self-pay | Admitting: *Deleted

## 2024-01-02 DIAGNOSIS — I10 Essential (primary) hypertension: Secondary | ICD-10-CM

## 2024-01-02 DIAGNOSIS — M5416 Radiculopathy, lumbar region: Secondary | ICD-10-CM | POA: Diagnosis not present

## 2024-01-02 DIAGNOSIS — E7800 Pure hypercholesterolemia, unspecified: Secondary | ICD-10-CM

## 2024-01-02 NOTE — Telephone Encounter (Signed)
-----   Message from Gerry Krone sent at 01/02/2024 10:02 AM EDT ----- Regarding: Lab orders for [facility], 5.15.25 Patient is scheduled for CPX labs, please order future labs, Thanks , Anselmo Kings

## 2024-01-14 ENCOUNTER — Other Ambulatory Visit: Payer: Self-pay | Admitting: Cardiology

## 2024-01-20 ENCOUNTER — Ambulatory Visit (INDEPENDENT_AMBULATORY_CARE_PROVIDER_SITE_OTHER): Payer: Medicare Other

## 2024-01-20 VITALS — BP 128/60 | Ht 68.5 in | Wt 142.0 lb

## 2024-01-20 DIAGNOSIS — Z0000 Encounter for general adult medical examination without abnormal findings: Secondary | ICD-10-CM

## 2024-01-20 DIAGNOSIS — Z5320 Procedure and treatment not carried out because of patient's decision for unspecified reasons: Secondary | ICD-10-CM | POA: Diagnosis not present

## 2024-01-20 DIAGNOSIS — Z2821 Immunization not carried out because of patient refusal: Secondary | ICD-10-CM | POA: Diagnosis not present

## 2024-01-20 NOTE — Patient Instructions (Signed)
 Ms. Christine Daniels , Thank you for taking time out of your busy schedule to complete your Annual Wellness Visit with me. I enjoyed our conversation and look forward to speaking with you again next year. I, as well as your care team,  appreciate your ongoing commitment to your health goals. Please review the following plan we discussed and let me know if I can assist you in the future. Your Game plan/ To Do List    Referrals: If you haven't heard from the office you've been referred to, please reach out to them at the phone provided.   Follow up Visits: Next Medicare AWV with our clinical staff: 01/20/2025  Have you seen your provider in the last 6 months (3 months if uncontrolled diabetes)? Yes Next Office Visit with your provider: 01/30/2024 at 8:40  Clinician Recommendations:  Aim for 30 minutes of exercise or brisk walking, 6-8 glasses of water, and 5 servings of fruits and vegetables each day. Remember that you are dur for Covid and Shingles vaccine and also Mammogram and Dexa scan.      This is a list of the screening recommended for you and due dates:  Health Maintenance  Topic Date Due   Zoster (Shingles) Vaccine (1 of 2) Never done   DEXA scan (bone density measurement)  09/25/2021   COVID-19 Vaccine (5 - 2024-25 season) 05/12/2023   Mammogram  06/27/2023   Flu Shot  04/10/2024   Medicare Annual Wellness Visit  01/19/2025   Pneumonia Vaccine  Completed   Hepatitis C Screening  Completed   HPV Vaccine  Aged Out   Meningitis B Vaccine  Aged Out   DTaP/Tdap/Td vaccine  Discontinued   Colon Cancer Screening  Discontinued   Cologuard (Stool DNA test)  Discontinued    Advanced directives: (In Chart) A copy of your advanced directives are scanned into your chart should your provider ever need it. Advance Care Planning is important because it:  [x]  Makes sure you receive the medical care that is consistent with your values, goals, and preferences  [x]  It provides guidance to your family  and loved ones and reduces their decisional burden about whether or not they are making the right decisions based on your wishes.  Follow the link provided in your after visit summary or read over the paperwork we have mailed to you to help you started getting your Advance Directives in place. If you need assistance in completing these, please reach out to us  so that we can help you!  See attachments for Preventive Care and Fall Prevention Tips.

## 2024-01-20 NOTE — Progress Notes (Signed)
 Because this visit was a virtual/telehealth visit,  certain criteria was not obtained, such a blood pressure, CBG if applicable, and timed get up and go. Any medications not marked as "taking" were not mentioned during the medication reconciliation part of the visit. Any vitals not documented were not able to be obtained due to this being a telehealth visit or patient was unable to self-report a recent blood pressure reading due to a lack of equipment at home via telehealth. Vitals that have been documented are verbally provided by the patient.   This visit was performed by a medical professional under my direct supervision. I was immediately available for consultation/collaboration. I have reviewed and agree with the Annual Wellness Visit documentation.  Subjective:   Christine Daniels is a 78 y.o. who presents for a Medicare Wellness preventive visit.  As a reminder, Annual Wellness Visits don't include a physical exam, and some assessments may be limited, especially if this visit is performed virtually. We may recommend an in-person visit if needed.  Visit Complete: Virtual I connected with  C[place]rest on 01/20/24 by a audio enabled telemedicine application and verified that I am speaking with the correct person using two identifiers.  Patient Location: Home  Provider Location: Home Office  I discussed the limitations of evaluation and management by telemedicine. The patient expressed understanding and agreed to proceed.  Vital Signs: Because this visit was a virtual/telehealth visit, some criteria may be missing or patient reported. Any vitals not documented were not able to be obtained and vitals that have been documented are patient reported.  VideoDeclined- This patient declined Librarian, academic. Therefore the visit was completed with audio only.  Persons Participating in Visit: Patient.  AWV Questionnaire: Yes: Patient Medicare AWV questionnaire was  completed by the patient on 01/19/2024; I have confirmed that all information answered by patient is correct and no changes since this date.  Cardiac Risk Factors include: hypertension;advanced age (>1men, >57 women);dyslipidemia     Objective:     Today's Vitals   01/20/24 0906  BP: 128/60  Weight: 142 lb (64.4 kg)  Height: 5' 8.5" (1.74 m)   Body mass index is 21.28 kg/m.     01/20/2024    9:05 AM 01/16/2023    9:03 AM 01/12/2022    8:30 AM 08/22/2020    8:42 AM 04/16/2018    9:11 AM 08/24/2015    9:19 AM 08/25/2014    9:51 AM  Advanced Directives  Does Patient Have a Medical Advance Directive? Yes Yes Yes Yes Yes Yes Yes  Type of Estate agent of [place];Living will Healthcare Power of [place];Living will Healthcare Power of [place];Living will Healthcare Power of [employer] of [place];Living will Healthcare Power of [place];Living will Living will;Healthcare Power of Attorney  Does patient want to make changes to medical advance directive? No - Patient declined No - Patient declined No - Patient declined No - Patient declined   No - Patient declined  Copy of Healthcare Power of Attorney in Chart? Yes - validated most recent copy scanned in chart (See row information) Yes - validated most recent copy scanned in chart (See row information) No - copy requested No - copy requested No - copy requested  No - copy requested    Current Medications (verified) Outpatient Encounter Medications as of 01/20/2024  Medication Sig   Acetaminophen (TYLENOL) 325 MG CAPS Tylenol   Coenzyme Q10 (CO Q10) 100 MG TABS Take 1 tablet by mouth  every other day.   KLOR-CON  M20 20 MEQ tablet TAKE 1 TABLET BY MOUTH EVERY OTHER DAY.   loratadine-pseudoephedrine (CLARITIN-D 12-HOUR) 5-120 MG per tablet Take 1 tablet by mouth daily as needed.   losartan  (COZAAR ) 25 MG tablet TAKE 1 TABLET (25 MG TOTAL) BY MOUTH DAILY.   metoprolol  succinate (TOPROL -XL) 25 MG 24 hr tablet  TAKE 1 TABLET BY MOUTH EVERY DAY   rosuvastatin  (CRESTOR ) 5 MG tablet TAKE 1 TABLET BY MOUTH EVERY OTHER DAY   triamterene -hydrochlorothiazide (MAXZIDE) 75-50 MG per tablet Take 1 tablet by mouth daily.   No facility-administered encounter medications on file as of 01/20/2024.    Allergies (verified) Patient has no known allergies.   History: Past Medical History:  Diagnosis Date   Arthritis    Carotid artery occlusion    CEREBROVASCULAR DISEASE    HYPERLIPIDEMIA    Hypertension    Migraine    Past Surgical History:  Procedure Laterality Date   ABDOMINAL HYSTERECTOMY  09/10/2008   BREAST BIOPSY  09/10/1985   REPLACEMENT TOTAL KNEE Left 11/2022   Status post bilateral tubal ligation.     Family History  Problem Relation Age of Onset   Breast cancer Mother 16   Hyperlipidemia Father    Stroke Father 72   Heart disease Brother    Cancer Son        glioma   Social History   Socioeconomic History   Marital status: Married    Spouse name: Christine Daniels    Number of children: 2   Years of education: College   Highest education level: Bachelor's degree (e.g., BA, AB, BS)  Occupational History   Not on file  Tobacco Use   Smoking status: Former    Current packs/day: 0.00    Average packs/day: 0.3 packs/day for 3.0 years (0.8 ttl pk-yrs)    Types: Cigarettes    Start date: 09/10/1974    Quit date: 09/10/1977    Years since quitting: 46.3   Smokeless tobacco: Never  Vaping Use   Vaping status: Never Used  Substance and Sexual Activity   Alcohol use: Yes    Alcohol/week: 1.0 standard drink of alcohol    Types: 1 Standard drinks or equivalent per week    Comment: occasionally a beer, 1-2 at a time   Drug use: No   Sexual activity: Yes    Birth control/protection: Post-menopausal, Surgical  Other Topics Concern   Not on file  Social History Narrative   She has remote history of minimal tobacco use, but has     not smoked in years.  She rarely consumes alcohol.    Desires CPR       01/28/20   From: the area   Living: with husband, Christine Daniels [phone])    Work: Clinical biochemist rep for an [employer]      Family: has 2 adult children - Museum/gallery exhibitions officer and Christine Daniels - 2 grandchildren      Enjoys: play golf, spend time with friends      Exercise: walking - not as much as she should   Diet: egg and toast, works through lunch but eats, healthy food at dinner      Safety   Seat belts: Yes    Guns: No   Safe in relationships: Yes    Social Drivers of Corporate investment banker Strain: Low Risk  (01/19/2024)   Overall Financial Resource Strain (CARDIA)    Difficulty of Paying Living Expenses: Not hard at all  Food  Insecurity: No Food Insecurity (01/19/2024)   Hunger Vital Sign    Worried About Running Out of Food in the Last Year: Never true    Ran Out of Food in the Last Year: Never true  Transportation Needs: No Transportation Needs (01/19/2024)   PRAPARE - Administrator, Civil Service (Medical): No    Lack of Transportation (Non-Medical): No  Physical Activity: Insufficiently Active (01/19/2024)   Exercise Vital Sign    Days of Exercise per Week: 3 days    Minutes of Exercise per Session: 20 min  Stress: No Stress Concern Present (01/19/2024)   [employer] of Occupational Health - Occupational Stress Questionnaire    Feeling of Stress : Not at all  Social Connections: Socially Integrated (01/19/2024)   Social Connection and Isolation Panel [NHANES]    Frequency of Communication with Friends and Family: More than three times a week    Frequency of Social Gatherings with Friends and Family: Twice a week    Attends Religious Services: 1 to 4 times per year    Active Member of [employer] or Organizations: Yes    Attends Banker Meetings: 1 to 4 times per year    Marital Status: Married    Tobacco Counseling Counseling given: Not Answered    Clinical Intake:  Pre-visit preparation completed: Yes  Pain : No/denies pain     BMI -  recorded: 21.28 Nutritional Status: BMI 25 -29 Overweight Nutritional Risks: None Diabetes: No  No results found for: "HGBA1C"   How often do you need to have someone help you when you read instructions, pamphlets, or other written materials from your doctor or pharmacy?: 1 - Never What is the last grade level you completed in school?: BA degree  Interpreter Needed?: No  Information entered by :: Juliann Ochoa   Activities of Daily Living     01/19/2024    3:53 PM  In your present state of health, do you have any difficulty performing the following activities:  Hearing? 0  Vision? 0  Difficulty concentrating or making decisions? 0  Walking or climbing stairs? 0  Dressing or bathing? 0  Doing errands, shopping? 0  Preparing Food and eating ? N  Using the Toilet? N  In the past six months, have you accidently leaked urine? N  Do you have problems with loss of bowel control? N  Managing your Medications? N  Managing your Finances? N  Housekeeping or managing your Housekeeping? N    Patient Care Team: Judithann Novas, MD as PCP - General (Family Medicine) Audery Blazing Deannie Fabian, MD as PCP - Cardiology (Cardiology) Adelaide Adjutant, MD (Physical Medicine and Rehabilitation) Audery Blazing Deannie Fabian, MD as Consulting Physician (Cardiology) Dannis Dy, MD (Inactive) as Consulting Physician (Vascular Surgery) Bonita Bussing, MD as Consulting Physician (Obstetrics and Gynecology) Wash Hack, MD as Referring Physician (Dermatology)  Indicate any recent Medical Services you may have received from other than [facility] providers in the past year (date may be approximate).     Assessment:    This is a routine wellness examination for Verenise.  Hearing/Vision screen Hearing Screening - Comments:: No hearing difficulties Vision Screening - Comments:: Patient wears glasses and contacts   Goals Addressed               This Visit's Progress     Maintain healthy  active lifestyle. (pt-stated)   On track     Maintain good health       Depression  Screen     01/20/2024    9:10 AM 01/16/2023    9:01 AM 10/10/2022    8:34 AM 01/12/2022    8:26 AM 08/22/2020    9:00 AM 08/22/2020    8:45 AM 05/27/2019    9:55 AM  PHQ 2/9 Scores  PHQ - 2 Score 0 0 0 0 0 0 0  PHQ- 9 Score 0  0        Fall Risk     01/19/2024    3:53 PM 01/16/2023    9:04 AM 10/10/2022    8:34 AM 01/12/2022    8:29 AM 08/22/2020    8:43 AM  Fall Risk   Falls in the past year? 0 0 0 0 0  Number falls in past yr: 0 0  0 0  Injury with Fall? 0 0  0 0  Risk for fall due to : No Fall Risks No Fall Risks  No Fall Risks   Follow up Falls prevention discussed;Falls evaluation completed Falls prevention discussed;Falls evaluation completed;Education provided   Falls evaluation completed    MEDICARE RISK AT HOME:  Medicare Risk at Home Any stairs in or around the home?: (Patient-Rptd) Yes If so, are there any without handrails?: (Patient-Rptd) No Home free of loose throw rugs in walkways, pet beds, electrical cords, etc?: (Patient-Rptd) Yes Adequate lighting in your home to reduce risk of falls?: (Patient-Rptd) Yes Life alert?: (Patient-Rptd) No Use of a cane, walker or w/c?: (Patient-Rptd) No Grab bars in the bathroom?: (Patient-Rptd) Yes Shower chair or bench in shower?: (Patient-Rptd) No Elevated toilet seat or a handicapped toilet?: (Patient-Rptd) Yes  TIMED UP AND GO:  Was the test performed?  No  Cognitive Function: 6CIT completed        01/20/2024    9:08 AM 01/16/2023    9:05 AM 01/12/2022    8:30 AM  6CIT Screen  What Year? 0 points 0 points 0 points  What month? 0 points 0 points 0 points  What time? 0 points 0 points 0 points  Count back from 20 0 points 0 points 0 points  Months in reverse 0 points 0 points 0 points  Repeat phrase 0 points 0 points 0 points  Total Score 0 points 0 points 0 points    Immunizations Immunization History  Administered Date(s)  Administered   Fluad Quad(high Dose 65+) 05/27/2019, 06/21/2021, 05/26/2023   Influenza, High Dose Seasonal PF 07/22/2018   Influenza,inj,Quad PF,6+ Mos 06/05/2013, 06/21/2014, 05/31/2016, 06/05/2017, 07/21/2020   Influenza,inj,quad, With Preservative 06/10/2017   Influenza-Unspecified 05/27/2022   PFIZER Comirnaty(Gray Top)Covid-19 Tri-Sucrose Vaccine 03/17/2021   PFIZER(Purple Top)SARS-COV-2 Vaccination 10/24/2019, 11/18/2019, 08/07/2020   Pneumococcal Conjugate-13 07/27/2014   Pneumococcal Polysaccharide-23 06/05/2013   Tdap 02/11/2012    Screening Tests Health Maintenance  Topic Date Due   Zoster Vaccines- Shingrix (1 of 2) Never done   DEXA SCAN  09/25/2021   COVID-19 Vaccine (5 - 2024-25 season) 05/12/2023   MAMMOGRAM  06/27/2023   INFLUENZA VACCINE  04/10/2024   Medicare Annual Wellness (AWV)  01/19/2025   Pneumonia Vaccine 57+ Years old  Completed   Hepatitis C Screening  Completed   HPV VACCINES  Aged Out   Meningococcal B Vaccine  Aged Out   DTaP/Tdap/Td  Discontinued   Colonoscopy  Discontinued   Fecal DNA (Cologuard)  Discontinued    Health Maintenance  Health Maintenance Due  Topic Date Due   Zoster Vaccines- Shingrix (1 of 2) Never done   DEXA SCAN  09/25/2021  COVID-19 Vaccine (5 - 2024-25 season) 05/12/2023   MAMMOGRAM  06/27/2023   Health Maintenance Items Addressed:patient declined health maintenance  Additional Screening:  Vision Screening: Recommended annual ophthalmology exams for early detection of glaucoma and other disorders of the eye.  Dental Screening: Recommended annual dental exams for proper oral hygiene  Community Resource Referral / Chronic Care Management: CRR required this visit?  No   CCM required this visit?  No   Plan:    I have personally reviewed and noted the following in the patient's chart:   Medical and social history Use of alcohol, tobacco or illicit drugs  Current medications and supplements including opioid  prescriptions. Patient is not currently taking opioid prescriptions. Functional ability and status Nutritional status Physical activity Advanced directives List of other physicians Hospitalizations, surgeries, and ER visits in previous 12 months Vitals Screenings to include cognitive, depression, and falls Referrals and appointments  In addition, I have reviewed and discussed with patient certain preventive protocols, quality metrics, and best practice recommendations. A written personalized care plan for preventive services as well as general preventive health recommendations were provided to patient.   Freeda Jerry, [place]   01/20/2024   After Visit Summary: (MyChart) Due to this being a telephonic visit, the after visit summary with patients personalized plan was offered to patient via MyChart   Notes: Nothing significant to report at this time.

## 2024-01-23 ENCOUNTER — Other Ambulatory Visit (INDEPENDENT_AMBULATORY_CARE_PROVIDER_SITE_OTHER): Payer: Medicare Other

## 2024-01-23 ENCOUNTER — Ambulatory Visit: Payer: Self-pay | Admitting: Family Medicine

## 2024-01-23 DIAGNOSIS — I10 Essential (primary) hypertension: Secondary | ICD-10-CM

## 2024-01-23 DIAGNOSIS — E7800 Pure hypercholesterolemia, unspecified: Secondary | ICD-10-CM | POA: Diagnosis not present

## 2024-01-23 LAB — COMPREHENSIVE METABOLIC PANEL WITH GFR
ALT: 12 U/L (ref 0–35)
AST: 14 U/L (ref 0–37)
Albumin: 4.1 g/dL (ref 3.5–5.2)
Alkaline Phosphatase: 97 U/L (ref 39–117)
BUN: 28 mg/dL — ABNORMAL HIGH (ref 6–23)
CO2: 31 meq/L (ref 19–32)
Calcium: 9.3 mg/dL (ref 8.4–10.5)
Chloride: 98 meq/L (ref 96–112)
Creatinine, Ser: 1.41 mg/dL — ABNORMAL HIGH (ref 0.40–1.20)
GFR: 36.09 mL/min — ABNORMAL LOW (ref >60.00)
Glucose, Bld: 98 mg/dL (ref 70–99)
Potassium: 3.9 meq/L (ref 3.5–5.1)
Sodium: 139 meq/L (ref 135–145)
Total Bilirubin: 0.9 mg/dL (ref 0.2–1.2)
Total Protein: 6.4 g/dL (ref 6.0–8.3)

## 2024-01-23 LAB — LIPID PANEL
Cholesterol: 154 mg/dL (ref 0–200)
HDL: 37.1 mg/dL — ABNORMAL LOW (ref >39.00)
LDL Cholesterol: 93 mg/dL (ref 0–99)
NonHDL: 116.67
Total CHOL/HDL Ratio: 4
Triglycerides: 119 mg/dL (ref 0.0–149.0)
VLDL: 23.8 mg/dL (ref 0.0–40.0)

## 2024-01-23 NOTE — Progress Notes (Signed)
 No critical labs need to be addressed urgently. We will discuss labs in detail at upcoming office visit.

## 2024-01-29 DIAGNOSIS — M7062 Trochanteric bursitis, left hip: Secondary | ICD-10-CM | POA: Diagnosis not present

## 2024-01-30 ENCOUNTER — Ambulatory Visit (INDEPENDENT_AMBULATORY_CARE_PROVIDER_SITE_OTHER): Payer: Medicare Other | Admitting: Family Medicine

## 2024-01-30 ENCOUNTER — Encounter: Payer: Self-pay | Admitting: Family Medicine

## 2024-01-30 VITALS — BP 138/70 | HR 78 | Temp 99.0°F | Ht 66.5 in | Wt 154.5 lb

## 2024-01-30 DIAGNOSIS — I779 Disorder of arteries and arterioles, unspecified: Secondary | ICD-10-CM | POA: Diagnosis not present

## 2024-01-30 DIAGNOSIS — E785 Hyperlipidemia, unspecified: Secondary | ICD-10-CM

## 2024-01-30 DIAGNOSIS — Z0000 Encounter for general adult medical examination without abnormal findings: Secondary | ICD-10-CM

## 2024-01-30 DIAGNOSIS — M85859 Other specified disorders of bone density and structure, unspecified thigh: Secondary | ICD-10-CM | POA: Diagnosis not present

## 2024-01-30 DIAGNOSIS — I10 Essential (primary) hypertension: Secondary | ICD-10-CM | POA: Diagnosis not present

## 2024-01-30 DIAGNOSIS — R944 Abnormal results of kidney function studies: Secondary | ICD-10-CM | POA: Diagnosis not present

## 2024-01-30 NOTE — Patient Instructions (Signed)
 Keep up with the water intake. Avoid NSAIDs: aleve, ibuprofen.

## 2024-01-30 NOTE — Assessment & Plan Note (Addendum)
 Chronic, LDL almost at goal < 70 given carotid stenosis on crestor  5 mg every other day.  Would recommend increase to 10 mg daily if tolerated.   She would prefer to make lifestyle changes and recheck 3-6 month

## 2024-01-30 NOTE — Assessment & Plan Note (Addendum)
 0-39% bilateral on dopplers in 2021. On aspirin and statin. Released from Dr. Shaunna [place]  Will plan repeat q 5 years... plan next in 2026

## 2024-01-30 NOTE — Assessment & Plan Note (Signed)
Stable, chronic.  Continue current medication.  Continue metoprolol 25 mg, Maxzide 75-'50mg'$  , losartan 25 mg daily

## 2024-01-30 NOTE — Assessment & Plan Note (Addendum)
 Due for DEXA... may be able to have at [facility].

## 2024-01-30 NOTE — Progress Notes (Signed)
 Patient ID: Christine Daniels, female    DOB: October 22, 1945, 78 y.o.   MRN: [id number]  This visit was conducted in person.  BP 138/70   Pulse 78   Temp 99 F (37.2 C) (Temporal)   Ht 5' 6.5" (1.689 m)   Wt 154 lb 8 oz (70.1 kg)   SpO2 96%   BMI 24.56 kg/m    CC:  Chief Complaint  Patient presents with   Annual Exam    Part 2 ([facility] 01/20/2024)    Subjective:   HPI: Christine Daniels is a 78 y.o. female presenting on 01/30/2024 for Annual Exam (Part 2 ([facility] 01/20/2024))  The patient presents for , complete physical and review of chronic health problems. He/She also has the following acute concerns today: none  The patient saw a LPN or RN for medicare wellness visit.01/20/2024  Prevention and wellness was reviewed in detail. Note reviewed and important notes copied below.  Hx of carotid artery disease, last 2021,  followed by vascular until last few years... no further needed given unchanged. Reviewed last cardiology OV from 09/26/2022.   Dr. Audery Blazing Cardiology. No history of stroke.  PLAN recheck in 2026.. I will need to order as no longer seeing vascular.  Hypertension:  Well controlled  On metoprolol  25 mg, losartan  25 mg daily and Maxzide 75-50mg   BP Readings from Last 3 Encounters:  01/30/24 138/70  01/20/24 128/60  10/15/23 128/60  Using medication without problems or lightheadedness:  none Chest pain with exertion:none Edema:none Short of breath:none Average home BPs: 100-120/60-70 Other issues:   Has had some increase in CR, decrease in GFR... had not been drinking much water... has now started to increase.  Occ BC, no NSAIDs  Losartan   new in 11/2022    High cholesterol: last check in  2023 .Aaron Aas LDL almost at goal < 70 given carotid stenosis on crestor  5 mg every other day. Lab Results  Component Value Date   CHOL 154 01/23/2024   HDL 37.10 (L) 01/23/2024   LDLCALC 93 01/23/2024   LDLDIRECT 166.0 05/31/2016   TRIG 119.0 01/23/2024   CHOLHDL 4 01/23/2024    Chronic migraine: per last [place] note:  She was on zonisamide , but stopped.  Rare migraine in last several years.  Rare occurrence. Referred to neuro 05/2022... never went.   Relevant past medical, surgical, family and social history reviewed and updated as indicated. Interim medical history since our last visit reviewed. Allergies and medications reviewed and updated. Outpatient Medications Prior to Visit  Medication Sig Dispense Refill   Acetaminophen (TYLENOL) 325 MG CAPS Tylenol     Coenzyme Q10 (CO Q10) 100 MG TABS Take 1 tablet by mouth every other day.     KLOR-CON  M20 20 MEQ tablet TAKE 1 TABLET BY MOUTH EVERY OTHER DAY. 45 tablet 4   loratadine-pseudoephedrine (CLARITIN-D 12-HOUR) 5-120 MG per tablet Take 1 tablet by mouth daily as needed.     losartan  (COZAAR ) 25 MG tablet TAKE 1 TABLET (25 MG TOTAL) BY MOUTH DAILY. 90 tablet 3   metoprolol  succinate (TOPROL -XL) 25 MG 24 hr tablet TAKE 1 TABLET BY MOUTH EVERY DAY 90 tablet 3   rosuvastatin  (CRESTOR ) 5 MG tablet TAKE 1 TABLET BY MOUTH EVERY OTHER DAY 45 tablet 3   triamterene -hydrochlorothiazide (MAXZIDE) 75-50 MG per tablet Take 1 tablet by mouth daily. 100 tablet 3   No facility-administered medications prior to visit.     Per HPI unless specifically indicated in ROS section below Review  of Systems  Constitutional:  Negative for fatigue and fever.  HENT:  Negative for congestion.   Eyes:  Negative for pain.  Respiratory:  Negative for cough and shortness of breath.   Cardiovascular:  Negative for chest pain, palpitations and leg swelling.  Gastrointestinal:  Negative for abdominal pain.  Genitourinary:  Negative for dysuria and vaginal bleeding.  Musculoskeletal:  Negative for back pain.  Neurological:  Negative for syncope, light-headedness and headaches.  Psychiatric/Behavioral:  Negative for dysphoric mood.    Objective:  BP 138/70   Pulse 78   Temp 99 F (37.2 C) (Temporal)   Ht 5' 6.5" (1.689 m)   Wt 154 lb 8  oz (70.1 kg)   SpO2 96%   BMI 24.56 kg/m   Wt Readings from Last 3 Encounters:  01/30/24 154 lb 8 oz (70.1 kg)  01/20/24 142 lb (64.4 kg)  10/15/23 155 lb (70.3 kg)      Physical Exam Constitutional:      General: She is not in acute distress.    Appearance: Normal appearance. She is well-developed. She is not ill-appearing or toxic-appearing.  HENT:     Head: Normocephalic.     Right Ear: Hearing, tympanic membrane, ear canal and external ear normal. Tympanic membrane is not erythematous, retracted or bulging.     Left Ear: Hearing, tympanic membrane, ear canal and external ear normal. Tympanic membrane is not erythematous, retracted or bulging.     Nose: No mucosal edema or rhinorrhea.     Right Sinus: No maxillary sinus tenderness or frontal sinus tenderness.     Left Sinus: No maxillary sinus tenderness or frontal sinus tenderness.     Mouth/Throat:     Pharynx: Uvula midline.  Eyes:     General: Lids are normal. Lids are everted, no foreign bodies appreciated.     Conjunctiva/sclera: Conjunctivae normal.     Pupils: Pupils are equal, round, and reactive to light.  Neck:     Thyroid : No thyroid  mass or thyromegaly.     Vascular: No carotid bruit.     Trachea: Trachea normal.  Cardiovascular:     Rate and Rhythm: Normal rate and regular rhythm.     Pulses: Normal pulses.     Heart sounds: Normal heart sounds, S1 normal and S2 normal. No murmur heard.    No friction rub. No gallop.  Pulmonary:     Effort: Pulmonary effort is normal. No tachypnea or respiratory distress.     Breath sounds: Normal breath sounds. No decreased breath sounds, wheezing, rhonchi or rales.  Abdominal:     General: Bowel sounds are normal.     Palpations: Abdomen is soft.     Tenderness: There is no abdominal tenderness.  Musculoskeletal:     Cervical back: Normal range of motion and neck supple.  Skin:    General: Skin is warm and dry.     Findings: No rash.  Neurological:     Mental  Status: She is alert.  Psychiatric:        Mood and Affect: Mood is not anxious or depressed.        Speech: Speech normal.        Behavior: Behavior normal. Behavior is cooperative.        Thought Content: Thought content normal.        Judgment: Judgment normal.       Results for orders placed or performed in visit on 01/23/24  Comprehensive Metabolic Panel (CMET)   Collection  Time: 01/23/24  8:20 AM  Result Value Ref Range   Sodium 139 135 - 145 mEq/L   Potassium 3.9 3.5 - 5.1 mEq/L   Chloride 98 96 - 112 mEq/L   CO2 31 19 - 32 mEq/L   Glucose, Bld 98 70 - 99 mg/dL   BUN 28 (H) 6 - 23 mg/dL   Creatinine, Ser 5.36 (H) 0.40 - 1.20 mg/dL   Total Bilirubin 0.9 0.2 - 1.2 mg/dL   Alkaline Phosphatase 97 39 - 117 U/L   AST 14 0 - 37 U/L   ALT 12 0 - 35 U/L   Total Protein 6.4 6.0 - 8.3 g/dL   Albumin 4.1 3.5 - 5.2 g/dL   GFR 64.40 (L) >34.74 mL/min   Calcium  9.3 8.4 - 10.5 mg/dL  Lipid panel   Collection Time: 01/23/24  8:20 AM  Result Value Ref Range   Cholesterol 154 0 - 200 mg/dL   Triglycerides 259.5 0.0 - 149.0 mg/dL   HDL 63.87 (L) >56.43 mg/dL   VLDL 32.9 0.0 - 51.8 mg/dL   LDL Cholesterol 93 0 - 99 mg/dL   Total CHOL/HDL Ratio 4    NonHDL 116.67     Assessment and Plan The patient's preventative maintenance and recommended screening tests for an annual wellness exam were reviewed in full today. Brought up to date unless services declined.  Counselled on the importance of diet, exercise, and its role in overall health and mortality. The patient's FH and SH was reviewed, including their home life, tobacco status, and drug and alcohol status.   Vaccines:Consider shingrix, uptodate with Tdap Pap/DVE:  not indicated... followed by GYN Dr. Kimberley Penman Mammo:  06/2022 DUE.Aaron Aas scheduled in October Bone Density: 2018 osteopenia.. repeat DUE Colon: 02/2018 Smoking Status: former ETOH/ drug use: none/none  Hep C:  done    Routine general medical examination at a health care  facility  Essential hypertension Assessment & Plan: Stable, chronic.  Continue current medication.  Continue metoprolol  25 mg, Maxzide 75-50mg  , losartan  25 mg daily   Hyperlipidemia, unspecified hyperlipidemia type Assessment & Plan: Chronic, LDL almost at goal < 70 given carotid stenosis on crestor  5 mg every other day.  Would recommend increase to 10 mg daily if tolerated.   She would prefer to make lifestyle changes and recheck 3-6 month   Carotid artery disease, unspecified laterality (HCC) Assessment & Plan:  0-39% bilateral on dopplers in 2021. On aspirin and statin. Released from Dr. Shaunna Delaware  Will plan repeat q 5 years... plan next in 2026   Decreased GFR -     Renal function panel; Future  Osteopenia of neck of femur, unspecified laterality Assessment & Plan:  Due for DEXA... may be able to have at [facility]      Return in about 2 weeks (around 02/13/2024) for lab only (renal panel).   Herby Lolling, MD

## 2024-03-12 DIAGNOSIS — H52223 Regular astigmatism, bilateral: Secondary | ICD-10-CM | POA: Diagnosis not present

## 2024-03-24 ENCOUNTER — Encounter: Payer: Self-pay | Admitting: Family Medicine

## 2024-03-24 ENCOUNTER — Telehealth: Payer: Self-pay | Admitting: Family Medicine

## 2024-03-24 ENCOUNTER — Other Ambulatory Visit

## 2024-03-24 NOTE — Telephone Encounter (Unsigned)
 Copied from CRM [id number]. Topic: Clinical - Request for Lab/Test Order >> Mar 24, 2024 12:36 PM [place] M wrote: Reason for CRM: Patient calling back asking for English, Harlene, wanting to know if she needs to fast before coming back to leave another specimen

## 2024-03-24 NOTE — Telephone Encounter (Signed)
 Spoke to pt she is coming back on 03/26/24 for recollect

## 2024-03-24 NOTE — Addendum Note (Signed)
 Addended by: ISADORA RAISIN on: 03/24/2024 03:06 PM   Modules accepted: Orders

## 2024-03-24 NOTE — Telephone Encounter (Signed)
 Called pt need her to come back to recollect her specimen.

## 2024-03-26 ENCOUNTER — Ambulatory Visit: Payer: Self-pay | Admitting: Family Medicine

## 2024-03-26 ENCOUNTER — Other Ambulatory Visit (INDEPENDENT_AMBULATORY_CARE_PROVIDER_SITE_OTHER)

## 2024-03-26 DIAGNOSIS — R944 Abnormal results of kidney function studies: Secondary | ICD-10-CM

## 2024-03-26 LAB — RENAL FUNCTION PANEL
Albumin: 4.1 g/dL (ref 3.5–5.2)
BUN: 21 mg/dL (ref 6–23)
CO2: 31 meq/L (ref 19–32)
Calcium: 9.2 mg/dL (ref 8.4–10.5)
Chloride: 99 meq/L (ref 96–112)
Creatinine, Ser: 1.16 mg/dL (ref 0.40–1.20)
GFR: 45.56 mL/min — ABNORMAL LOW (ref >60.00)
Glucose, Bld: 91 mg/dL (ref 70–99)
Phosphorus: 2.6 mg/dL (ref 2.3–4.6)
Potassium: 3.3 meq/L — ABNORMAL LOW (ref 3.5–5.1)
Sodium: 138 meq/L (ref 135–145)

## 2024-04-09 DIAGNOSIS — D485 Neoplasm of uncertain behavior of skin: Secondary | ICD-10-CM | POA: Diagnosis not present

## 2024-04-09 DIAGNOSIS — C44319 Basal cell carcinoma of skin of other parts of face: Secondary | ICD-10-CM | POA: Diagnosis not present

## 2024-04-09 DIAGNOSIS — D0439 Carcinoma in situ of skin of other parts of face: Secondary | ICD-10-CM | POA: Diagnosis not present

## 2024-04-09 DIAGNOSIS — Z08 Encounter for follow-up examination after completed treatment for malignant neoplasm: Secondary | ICD-10-CM | POA: Diagnosis not present

## 2024-04-09 DIAGNOSIS — Z85828 Personal history of other malignant neoplasm of skin: Secondary | ICD-10-CM | POA: Diagnosis not present

## 2024-04-09 DIAGNOSIS — C44311 Basal cell carcinoma of skin of nose: Secondary | ICD-10-CM | POA: Diagnosis not present

## 2024-04-09 DIAGNOSIS — L821 Other seborrheic keratosis: Secondary | ICD-10-CM | POA: Diagnosis not present

## 2024-05-01 DIAGNOSIS — M7062 Trochanteric bursitis, left hip: Secondary | ICD-10-CM | POA: Diagnosis not present

## 2024-06-04 DIAGNOSIS — L988 Other specified disorders of the skin and subcutaneous tissue: Secondary | ICD-10-CM | POA: Diagnosis not present

## 2024-06-04 DIAGNOSIS — L814 Other melanin hyperpigmentation: Secondary | ICD-10-CM | POA: Diagnosis not present

## 2024-06-04 DIAGNOSIS — L578 Other skin changes due to chronic exposure to nonionizing radiation: Secondary | ICD-10-CM | POA: Diagnosis not present

## 2024-06-04 DIAGNOSIS — C44319 Basal cell carcinoma of skin of other parts of face: Secondary | ICD-10-CM | POA: Diagnosis not present

## 2024-06-04 DIAGNOSIS — D0439 Carcinoma in situ of skin of other parts of face: Secondary | ICD-10-CM | POA: Diagnosis not present

## 2024-08-19 LAB — HM MAMMOGRAPHY

## 2024-08-26 ENCOUNTER — Ambulatory Visit: Payer: Self-pay | Admitting: Family Medicine

## 2024-11-09 ENCOUNTER — Ambulatory Visit: Admitting: Cardiology

## 2025-01-20 ENCOUNTER — Ambulatory Visit
